# Patient Record
Sex: Female | Born: 1940 | Race: White | Hispanic: No | Smoking: Never smoker
Health system: Southern US, Community
[De-identification: ages and names within clinical notes are randomized; demographics above are authoritative.]

## PROBLEM LIST (undated history)

## (undated) DIAGNOSIS — K279 Peptic ulcer, site unspecified, unspecified as acute or chronic, without hemorrhage or perforation: Secondary | ICD-10-CM

## (undated) DIAGNOSIS — Z9289 Personal history of other medical treatment: Secondary | ICD-10-CM

## (undated) DIAGNOSIS — F329 Major depressive disorder, single episode, unspecified: Secondary | ICD-10-CM

## (undated) DIAGNOSIS — I10 Essential (primary) hypertension: Secondary | ICD-10-CM

## (undated) DIAGNOSIS — J189 Pneumonia, unspecified organism: Secondary | ICD-10-CM

## (undated) DIAGNOSIS — Z87442 Personal history of urinary calculi: Secondary | ICD-10-CM

## (undated) DIAGNOSIS — R51 Headache: Secondary | ICD-10-CM

## (undated) DIAGNOSIS — D649 Anemia, unspecified: Secondary | ICD-10-CM

## (undated) DIAGNOSIS — H9193 Unspecified hearing loss, bilateral: Secondary | ICD-10-CM

## (undated) DIAGNOSIS — M329 Systemic lupus erythematosus, unspecified: Secondary | ICD-10-CM

## (undated) DIAGNOSIS — Z8639 Personal history of other endocrine, nutritional and metabolic disease: Secondary | ICD-10-CM

## (undated) DIAGNOSIS — R0981 Nasal congestion: Secondary | ICD-10-CM

## (undated) DIAGNOSIS — M1990 Unspecified osteoarthritis, unspecified site: Secondary | ICD-10-CM

## (undated) DIAGNOSIS — F1020 Alcohol dependence, uncomplicated: Secondary | ICD-10-CM

## (undated) DIAGNOSIS — E538 Deficiency of other specified B group vitamins: Secondary | ICD-10-CM

## (undated) DIAGNOSIS — M8080XA Other osteoporosis with current pathological fracture, unspecified site, initial encounter for fracture: Secondary | ICD-10-CM

## (undated) DIAGNOSIS — R0600 Dyspnea, unspecified: Secondary | ICD-10-CM

## (undated) DIAGNOSIS — F32A Depression, unspecified: Secondary | ICD-10-CM

## (undated) DIAGNOSIS — M84376A Stress fracture, unspecified foot, initial encounter for fracture: Secondary | ICD-10-CM

## (undated) DIAGNOSIS — J45909 Unspecified asthma, uncomplicated: Secondary | ICD-10-CM

## (undated) DIAGNOSIS — Z8719 Personal history of other diseases of the digestive system: Secondary | ICD-10-CM

## (undated) DIAGNOSIS — K219 Gastro-esophageal reflux disease without esophagitis: Secondary | ICD-10-CM

## (undated) HISTORY — PX: BACK SURGERY: SHX140

---
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
## 1961-10-27 HISTORY — PX: PARTIAL GASTRECTOMY: SHX2172

## 1993-10-27 DIAGNOSIS — M329 Systemic lupus erythematosus, unspecified: Secondary | ICD-10-CM

## 1993-10-27 HISTORY — DX: Systemic lupus erythematosus, unspecified: M32.9

## 2004-08-13 ENCOUNTER — Ambulatory Visit: Payer: Self-pay | Admitting: Neurological Surgery

## 2004-08-27 ENCOUNTER — Ambulatory Visit: Payer: Self-pay | Admitting: Pain Medicine

## 2004-09-10 ENCOUNTER — Ambulatory Visit: Payer: Self-pay | Admitting: Pain Medicine

## 2004-09-26 ENCOUNTER — Ambulatory Visit: Payer: Self-pay | Admitting: Pain Medicine

## 2004-10-09 ENCOUNTER — Ambulatory Visit: Payer: Self-pay | Admitting: Physician Assistant

## 2004-10-22 ENCOUNTER — Ambulatory Visit: Payer: Self-pay | Admitting: Physician Assistant

## 2004-11-05 ENCOUNTER — Ambulatory Visit: Payer: Self-pay | Admitting: Physician Assistant

## 2004-11-18 ENCOUNTER — Ambulatory Visit: Payer: Self-pay | Admitting: Physician Assistant

## 2005-05-01 ENCOUNTER — Ambulatory Visit: Payer: Self-pay | Admitting: Family Medicine

## 2005-05-13 ENCOUNTER — Ambulatory Visit: Payer: Self-pay | Admitting: Family Medicine

## 2005-10-31 ENCOUNTER — Ambulatory Visit: Payer: Self-pay | Admitting: Physician Assistant

## 2006-05-07 ENCOUNTER — Ambulatory Visit: Payer: Self-pay | Admitting: Family Medicine

## 2006-05-18 ENCOUNTER — Ambulatory Visit: Payer: Self-pay | Admitting: Family Medicine

## 2006-11-16 ENCOUNTER — Ambulatory Visit: Payer: Self-pay | Admitting: Physician Assistant

## 2007-05-26 ENCOUNTER — Ambulatory Visit: Payer: Self-pay | Admitting: Family Medicine

## 2007-07-13 ENCOUNTER — Ambulatory Visit: Payer: Self-pay | Admitting: Gastroenterology

## 2007-10-06 ENCOUNTER — Ambulatory Visit: Payer: Self-pay | Admitting: Maternal & Fetal Medicine

## 2007-10-28 HISTORY — PX: ANTERIOR CERVICAL DECOMP/DISCECTOMY FUSION: SHX1161

## 2007-11-08 ENCOUNTER — Ambulatory Visit: Payer: Self-pay | Admitting: Physician Assistant

## 2007-11-17 ENCOUNTER — Ambulatory Visit: Payer: Self-pay | Admitting: Pain Medicine

## 2008-01-06 ENCOUNTER — Ambulatory Visit (HOSPITAL_COMMUNITY): Admission: RE | Admit: 2008-01-06 | Discharge: 2008-01-07 | Payer: Self-pay | Admitting: Neurological Surgery

## 2008-05-30 ENCOUNTER — Ambulatory Visit: Payer: Self-pay | Admitting: Family Medicine

## 2009-06-19 ENCOUNTER — Ambulatory Visit: Payer: Self-pay | Admitting: Family Medicine

## 2009-06-26 ENCOUNTER — Ambulatory Visit: Payer: Self-pay | Admitting: Family Medicine

## 2010-07-10 ENCOUNTER — Ambulatory Visit: Payer: Self-pay | Admitting: Family Medicine

## 2010-10-27 HISTORY — PX: ORIF FEMUR FRACTURE: SHX2119

## 2011-03-11 NOTE — Op Note (Signed)
NAMEADALIE, MAND NO.:  [id number]   MEDICAL RECORD NO.:  [id number]          PATIENT TYPE:  INP   LOCATION:  3172                         FACILITY:  [facility]   PHYSICIAN:  Stefani Dama, M.D.  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   PROCEDURE:  Anterior cervical decompression C3-C4 and C4-C5, arthrodesis  with structural allograft, Alphatec plate fixation.   SURGEON:  Stefani Dama, M.D.   FIRST ASSISTANT:  Cristi Loron, M.D.   ANESTHESIA:  General endotracheal anesthesia.   INDICATIONS:  Ms. Hietala is a 70 year old individual who has noted that  she has had significant neck pain with left shoulder and arm weakness.  She was found to have severe cord compression at the level of C3-C4  along with foraminal stenosis on the left side at C4-C5 with advanced  spondylitic changes at both these levels.  She also has spondylitic  changes at C5-C6, but it appeared that this level had arthrodesed itself  already. She has been advised regarding surgical decompression of C3-C4  and C4-C5.   DESCRIPTION OF PROCEDURE:  The patient was brought to the operating room  supine on the stretcher. After the smooth induction of general  endotracheal anesthesia, she was placed in 5 pounds of halter traction.  The neck was draped and prepped with alcohol and DuraPrep in a sterile  fashion.  A transverse incision was made in the upper cervical portion  of the neck and dissection was carried down to the cervical spine  through the platysma.  The plane between the sternocleidomastoid and  strap muscles were dissected bluntly.  The first identifiable disc space  was  noted to be that of C3-C4 on localizing radiograph.  Dissection was  then carried out between C3-C4 and C4-C5 and a self-retaining retractor  was placed deep into the wound.  The prevertebral tissues were then  dissected on either side of midline and a self-retaining retractor was  placed into the wound.  The C3-C4 disc space was then identified and  opened with a #15 blade and a combination of curettes and rongeurs was  used to evacuate a substantial quantity of severely desiccated disc  material.  The region of the posterior longitudinal ligament was  reached.  There was noted to be a substantial osteophyte from the  inferior margin of the body of C3 and this was drilled down with a 2.3  mm dissecting tool.  Dissection was carried out to the right side and  also to the left side to decompress the common dural tube and the nerve  roots on either side.  A large uncinate spur was noted particularly on  the left side and this carefully dissected away.  Once the dissection  was complete here, some Gelfoam was packed into the lateral gutters for  hemostasis and attention  was then turned to C4-C5 where a similar  procedure was carried out.  Here, the disc space was noted be severely  desiccated, also, and the disc space itself did not want to distract  significantly. Once the disc space was opened to a depth of  approximately 8 mm, a self-retaining spreader could be placed and the  disc space could slowly be opened.  The lateral recess, particularly on  the left side, was decompressed of a large osteophytic overgrowth into  the region of the foramen.  This allowed for good decompression at the  takeoff of the C5 nerve root.  Once this was completed, the area was  irrigated copiously with antibiotic irrigating solution.  A 7 mm trans-  graft was then shaved into the appropriate size and shape and placed  into the interspace after it was filled with VITOSS which was soaked in  the patient's  blood. Attention was then turned to C3-C4 where a similar  size graft was shaped the appropriate size and shape and placed into the  interspace and also countersunk approximately 1-2 mm.  Then, a 30 mm  standard size Alphatec plate was placed on the prevertebral space and  secured with six locking variable angle 4 x 14 mm screws.  Final  radiograph was obtained identifying good position of the surgical  construct.  The wound was then checked for hemostasis.  The remainder of  the VITOSS was packed into the lateral gutters, particularly at C4-C5 on  the left hand side, and once this was accomplished, hemostasis was  doubly checked and the platysma was closed with 3-0 Vicryl and a 4-0  Monocryl subcuticular was used to close the skin.  The patient tolerated  the procedure well and was returned to the recovery room in stable  condition.      Stefani Dama, M.D.  Electronically Signed     HJE/MEDQ  D:  01/06/2008  T:  01/07/2008  Job:  [id number]

## 2011-04-18 ENCOUNTER — Ambulatory Visit: Payer: Self-pay | Admitting: Orthopedic Surgery

## 2011-04-25 ENCOUNTER — Inpatient Hospital Stay: Payer: Self-pay | Admitting: Internal Medicine

## 2011-07-21 LAB — BASIC METABOLIC PANEL
CO2: 29
Calcium: 9.5
Chloride: 105
Glucose, Bld: 94
Potassium: 4.6
Sodium: 140

## 2011-07-21 LAB — HEPATIC FUNCTION PANEL
Alkaline Phosphatase: 64
Bilirubin, Direct: 0.1
Indirect Bilirubin: 0.7
Total Bilirubin: 0.8

## 2011-07-21 LAB — CBC
HCT: 44.1
Hemoglobin: 14.6
MCHC: 33.2
MCV: 95.4
RBC: 4.62
RDW: 14.1

## 2011-08-14 ENCOUNTER — Ambulatory Visit: Payer: Self-pay | Admitting: Family Medicine

## 2011-10-30 ENCOUNTER — Ambulatory Visit: Payer: Self-pay

## 2012-03-01 ENCOUNTER — Ambulatory Visit: Payer: Self-pay | Admitting: Orthopedic Surgery

## 2012-07-15 ENCOUNTER — Emergency Department: Payer: Self-pay | Admitting: Emergency Medicine

## 2012-08-16 ENCOUNTER — Ambulatory Visit: Payer: Self-pay | Admitting: Family Medicine

## 2012-11-11 ENCOUNTER — Other Ambulatory Visit: Payer: Self-pay | Admitting: Neurological Surgery

## 2012-11-18 ENCOUNTER — Encounter (HOSPITAL_COMMUNITY): Payer: Self-pay | Admitting: Pharmacy Technician

## 2012-11-24 ENCOUNTER — Encounter (HOSPITAL_COMMUNITY)
Admission: RE | Admit: 2012-11-24 | Discharge: 2012-11-24 | Disposition: A | Discharge status: Home / Self Care | Payer: Medicare Other | Source: Ambulatory Visit | Attending: Neurological Surgery | Admitting: Neurological Surgery

## 2012-11-24 ENCOUNTER — Ambulatory Visit (HOSPITAL_COMMUNITY)
Admission: RE | Admit: 2012-11-24 | Discharge: 2012-11-24 | Disposition: A | Discharge status: Home / Self Care | Payer: Medicare Other | Source: Ambulatory Visit | Attending: Anesthesiology | Admitting: Anesthesiology

## 2012-11-24 ENCOUNTER — Encounter (HOSPITAL_COMMUNITY): Payer: Self-pay

## 2012-11-24 HISTORY — DX: Depression, unspecified: F32.A

## 2012-11-24 HISTORY — DX: Other osteoporosis with current pathological fracture, unspecified site, initial encounter for fracture: M80.80XA

## 2012-11-24 HISTORY — DX: Systemic lupus erythematosus, unspecified: M32.9

## 2012-11-24 HISTORY — DX: Unspecified asthma, uncomplicated: J45.909

## 2012-11-24 HISTORY — DX: Personal history of other diseases of the digestive system: Z87.19

## 2012-11-24 HISTORY — DX: Stress fracture, unspecified foot, initial encounter for fracture: M84.376A

## 2012-11-24 HISTORY — DX: Essential (primary) hypertension: I10

## 2012-11-24 HISTORY — DX: Unspecified hearing loss, bilateral: H91.93

## 2012-11-24 HISTORY — DX: Headache: R51

## 2012-11-24 HISTORY — DX: Personal history of other medical treatment: Z92.89

## 2012-11-24 HISTORY — DX: Major depressive disorder, single episode, unspecified: F32.9

## 2012-11-24 HISTORY — DX: Unspecified osteoarthritis, unspecified site: M19.90

## 2012-11-24 HISTORY — DX: Deficiency of other specified B group vitamins: E53.8

## 2012-11-24 HISTORY — DX: Nasal congestion: R09.81

## 2012-11-24 HISTORY — DX: Personal history of other endocrine, nutritional and metabolic disease: Z86.39

## 2012-11-24 HISTORY — DX: Anemia, unspecified: D64.9

## 2012-11-24 LAB — COMPREHENSIVE METABOLIC PANEL
Alkaline Phosphatase: 70 U/L (ref 39–117)
BUN: 20 mg/dL (ref 6–23)
Chloride: 103 mEq/L (ref 96–112)
Creatinine, Ser: 0.77 mg/dL (ref 0.50–1.10)
GFR calc Af Amer: >90 mL/min (ref >90)
GFR calc non Af Amer: 81 mL/min — ABNORMAL LOW (ref >90)
Glucose, Bld: 119 mg/dL — ABNORMAL HIGH (ref 70–99)
Potassium: 4.6 mEq/L (ref 3.5–5.1)
Total Bilirubin: 0.2 mg/dL — ABNORMAL LOW (ref 0.3–1.2)

## 2012-11-24 LAB — CBC
HCT: 43.8 % (ref 36.0–46.0)
Hemoglobin: 14 g/dL (ref 12.0–15.0)
MCHC: 32 g/dL (ref 30.0–36.0)
MCV: 93 fL (ref 78.0–100.0)
WBC: 7.1 10*3/uL (ref 4.0–10.5)

## 2012-11-24 LAB — TYPE AND SCREEN
ABO/RH(D): O POS
Antibody Screen: NEGATIVE

## 2012-11-24 NOTE — Pre-Procedure Instructions (Signed)
Renee Cabrera  11/24/2012   Your procedure is scheduled on:  Monday, February 3  Report to [facility] at 1020 AM.  Call this number if you have problems the morning of surgery: [phone]   Remember:   Do not eat food or drink liquids after midnight.Sunday night   Take these medicines the morning of surgery with A SIP OF WATER: Citalopram,Hydrocodone,              Stop aspirin,herbals, any medication that may thin your blood.  Do not wear jewelry, make-up or nail polish.  Do not wear lotions, powders, or perfumes. You may not wear deodorant.  Do not shave 48 hours prior to surgery.   Do not bring valuables to the hospital.  Contacts, dentures or bridgework may not be worn into surgery.  Leave suitcase in the car. After surgery it may be brought to your room.  For patients admitted to the hospital, checkout time is 11:00 AM the day of  discharge.        Special Instructions: Shower using CHG 2 nights before surgery and the night before surgery.  If you shower the day of surgery use CHG.  Use special wash - you have one bottle of CHG for all showers.  You should use approximately 1/3 of the bottle for each shower.   Please read over the following fact sheets that you were given: Pain Booklet, Coughing and Deep Breathing, Blood Transfusion Information, MRSA Information and Surgical Site Infection Prevention

## 2012-11-25 NOTE — Consult Note (Signed)
Anesthesia Chart Review:  Patient is a 72 year old female scheduled for L4-5 decompression/PLIF on 11/29/12 by Dr. Danielle Dess.  History includes non-smoker, HTN, depression, asthma, SLE '95, osteoporosis, hearing loss, anemia, GI bleed, arthritis, prior C3-4, C4-5 ACDF '09 and partial gastrectomy.  Preoperative labs noted.    EKG on 11/24/12 showed NSR, low voltage QRS, non-specific T wave abnormality.  CXR report on 11/24/12 showed (Dr. Carlota Raspberry): 1. 15 mm right upper lobe pulmonary nodule. Follow-up chest CT recommended for further assessment, preferably with infusion. These results will be called to the ordering clinician or representative by the Radiologist Assistant, and communication documented in the PACS Dashboard.  2. Old granulomatous disease.  3. No acute cardiopulmonary disease.  (I also left a message with Shanda Bumps at [facility] regarding results.  Will defer timing of further evaluation to Dr. Danielle Dess.)  If no acute change in her status or new pulmonary symptoms then would anticipate she could proceed as planned.  Shonna Chock, PA-C 11/25/12 1111

## 2012-11-28 MED ORDER — CEFAZOLIN SODIUM-DEXTROSE 2-3 GM-% IV SOLR
2.0000 g | INTRAVENOUS | Status: AC
  Administered 2012-11-29: 2 g via INTRAVENOUS
  Filled 2012-11-28: qty 50

## 2012-11-28 NOTE — H&P (Signed)
Renee Cabrera is an 72 y.o. female.   Chief Complaint: Back and bilateral leg pain HPI: Patient is a 71 y.o.lady whom I have followed since 2006 for difficulties with her neck and back. She has had decompression and fusion of her cervical spine in the past. She had progressive worsening of back and leg symptoms for a number of years related to progressive spondylolithesis. This is at L4-5. Having failed attempts at conservative manangement she is now admitted for surgical decompression and fusion.  Past Medical History  Diagnosis Date  . Stress fracture of foot     left foot  . Osteoporosis with fracture   . Hypertension   . H/O hypoglycemia   . Depression     controlled  . Asthma     remote h/o asthma  . Headache     h/o migraines  . Lupus (systemic lupus erythematosus) 1995    in remission since 2005  . Arthritis     osteoarthritis  . Vitamin B 12 deficiency   . Anemia   . Nasal sinus congestion   . Hearing loss, bilateral     wears hearing aides  . History of blood transfusion     pt states she had yellow jaundice  . H/O: GI bleed     Past Surgical History  Procedure Date  . Anterior cervical decomp/discectomy fusion 2009  . Tonsillectomy 1945  . Partial gastrectomy 1963    and reconstruction 1973  . Orif femur fracture 2012    No family history on file. Social History:  reports that she has never smoked. She has never used smokeless tobacco. She reports that she does not drink alcohol or use illicit drugs.  Allergies:  Allergies  Allergen Reactions  . Sulfa Antibiotics Rash    No prescriptions prior to admission    No results found for this or any previous visit (from the past 48 hour(s)). No results found.  Review of Systems  Constitutional: Negative.   HENT: Negative.   Eyes: Negative.   Respiratory: Negative.   Cardiovascular: Negative.   Gastrointestinal: Negative.   Genitourinary: Negative.   Musculoskeletal: Positive for back pain.  Skin:  Negative.   Neurological: Negative.   Endo/Heme/Allergies: Negative.   Psychiatric/Behavioral: Negative.     There were no vitals taken for this visit. Physical Exam  Constitutional: She is oriented to person, place, and time. She appears well-developed and well-nourished.  HENT:  Head: Normocephalic and atraumatic.  Eyes: Conjunctivae normal and EOM are normal. Pupils are equal, round, and reactive to light.  Neck: Normal range of motion. Neck supple.  Cardiovascular: Normal rate, regular rhythm and normal heart sounds.   Respiratory: Effort normal and breath sounds normal.  GI: Soft. Bowel sounds are normal.  Musculoskeletal: Normal range of motion.  Neurological: She is alert and oriented to person, place, and time. She has normal reflexes.  Skin: Skin is warm and dry.  Psychiatric: She has a normal mood and affect. Her behavior is normal. Judgment and thought content normal.     Assessment/Plan   I had seen her back at the end of  September and discussed with her her spondylolisthesis and high grade stenosis at L4-L5.  I indicated that ultimately I believe that Renee Cabrera would require surgical decompression and stabilization, but as long as her symptoms were tolerable, she could continue to follow this.  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  She notes that the back symptoms have not gotten a whole lot better and they continue to bother her considerably and she would like to proceed with surgical intervention for this process.  I again reviewed her MRI which demonstrates that she has a spondylolisthesis at L4-L5 with a high grade stenosis of the central portion of the canal.  I advised that surgery would require decompression of the central portion of the canal, lateral recesses, which would cause instability, and she would need to have this joint fused.  This would require placement of screws in the vertebrae  of L4 and L5 with two short rods to hold the screws together.  Afterwards, she would be required to use a corset type brace to maintain her posture.  The surgery should be eminently tolerable for her.  She does live alone.  She asked whether she would need help at the house and she has arranged for some help from some neighbor friends that can help her initially.  I am hopeful that she should get by with the surgery and she should ultimately feel better from her back and her lower extremities. She is admitted for surgery.     Adonai Selsor J 11/28/2012, 4:21 AM

## 2012-11-29 ENCOUNTER — Encounter (HOSPITAL_COMMUNITY): Payer: Self-pay

## 2012-11-29 ENCOUNTER — Encounter (HOSPITAL_COMMUNITY): Payer: Self-pay | Admitting: Vascular Surgery

## 2012-11-29 ENCOUNTER — Encounter (HOSPITAL_COMMUNITY)
Admission: RE | Disposition: A | Discharge status: Home / Self Care | Payer: Self-pay | Source: Ambulatory Visit | Attending: Neurological Surgery

## 2012-11-29 ENCOUNTER — Inpatient Hospital Stay (HOSPITAL_COMMUNITY): Payer: Medicare Other | Admitting: Vascular Surgery

## 2012-11-29 ENCOUNTER — Inpatient Hospital Stay (HOSPITAL_COMMUNITY): Payer: Medicare Other

## 2012-11-29 ENCOUNTER — Inpatient Hospital Stay (HOSPITAL_COMMUNITY)
Admission: RE | Admit: 2012-11-29 | Discharge: 2012-12-03 | DRG: 460 | Disposition: A | Discharge status: Home / Self Care | Payer: Medicare Other | Source: Ambulatory Visit | Attending: Neurological Surgery | Admitting: Neurological Surgery

## 2012-11-29 DIAGNOSIS — Q762 Congenital spondylolisthesis: Secondary | ICD-10-CM

## 2012-11-29 DIAGNOSIS — F329 Major depressive disorder, single episode, unspecified: Secondary | ICD-10-CM | POA: Diagnosis present

## 2012-11-29 DIAGNOSIS — Z01812 Encounter for preprocedural laboratory examination: Secondary | ICD-10-CM

## 2012-11-29 DIAGNOSIS — M4120 Other idiopathic scoliosis, site unspecified: Secondary | ICD-10-CM | POA: Diagnosis present

## 2012-11-29 DIAGNOSIS — Z882 Allergy status to sulfonamides status: Secondary | ICD-10-CM

## 2012-11-29 DIAGNOSIS — I10 Essential (primary) hypertension: Secondary | ICD-10-CM | POA: Diagnosis present

## 2012-11-29 DIAGNOSIS — Z7982 Long term (current) use of aspirin: Secondary | ICD-10-CM

## 2012-11-29 DIAGNOSIS — F3289 Other specified depressive episodes: Secondary | ICD-10-CM | POA: Diagnosis present

## 2012-11-29 DIAGNOSIS — M4316 Spondylolisthesis, lumbar region: Secondary | ICD-10-CM | POA: Diagnosis present

## 2012-11-29 DIAGNOSIS — M47817 Spondylosis without myelopathy or radiculopathy, lumbosacral region: Principal | ICD-10-CM | POA: Diagnosis present

## 2012-11-29 DIAGNOSIS — Z79899 Other long term (current) drug therapy: Secondary | ICD-10-CM

## 2012-11-29 DIAGNOSIS — Z981 Arthrodesis status: Secondary | ICD-10-CM

## 2012-11-29 DIAGNOSIS — M329 Systemic lupus erythematosus, unspecified: Secondary | ICD-10-CM | POA: Diagnosis present

## 2012-11-29 DIAGNOSIS — M810 Age-related osteoporosis without current pathological fracture: Secondary | ICD-10-CM | POA: Diagnosis present

## 2012-11-29 SURGERY — POSTERIOR LUMBAR FUSION 1 LEVEL
Anesthesia: General | Site: Spine Lumbar | Wound class: Clean

## 2012-11-29 MED ORDER — FENTANYL CITRATE 0.05 MG/ML IJ SOLN
INTRAMUSCULAR | Status: DC | PRN
  Administered 2012-11-29 (×4): 50 ug via INTRAVENOUS
  Administered 2012-11-29: 100 ug via INTRAVENOUS

## 2012-11-29 MED ORDER — NEOSTIGMINE METHYLSULFATE 1 MG/ML IJ SOLN
INTRAMUSCULAR | Status: DC | PRN
  Administered 2012-11-29: 2 mg via INTRAVENOUS

## 2012-11-29 MED ORDER — METHOCARBAMOL 500 MG PO TABS
500.0000 mg | ORAL_TABLET | Freq: Four times a day (QID) | ORAL | Status: DC | PRN
  Administered 2012-11-30: 500 mg via ORAL
  Filled 2012-11-29: qty 1

## 2012-11-29 MED ORDER — LACTATED RINGERS IV SOLN
INTRAVENOUS | Status: DC | PRN
  Administered 2012-11-29 (×2): via INTRAVENOUS

## 2012-11-29 MED ORDER — ONDANSETRON HCL 4 MG/2ML IJ SOLN
INTRAMUSCULAR | Status: DC | PRN
  Administered 2012-11-29: 4 mg via INTRAVENOUS

## 2012-11-29 MED ORDER — ALUM & MAG HYDROXIDE-SIMETH 200-200-20 MG/5ML PO SUSP: 30.0000 mL | Freq: Four times a day (QID) | ORAL | Status: DC | PRN

## 2012-11-29 MED ORDER — OXYCODONE-ACETAMINOPHEN 5-325 MG PO TABS
1.0000 | ORAL_TABLET | ORAL | Status: DC | PRN
  Administered 2012-11-29: 1 via ORAL
  Administered 2012-11-30 (×3): 2 via ORAL
  Administered 2012-12-01: 1 via ORAL
  Administered 2012-12-01: 2 via ORAL
  Administered 2012-12-02 – 2012-12-03 (×4): 1 via ORAL
  Filled 2012-11-29 (×2): qty 2
  Filled 2012-11-29: qty 1
  Filled 2012-11-29: qty 2
  Filled 2012-11-29 (×4): qty 1
  Filled 2012-11-29 (×2): qty 2

## 2012-11-29 MED ORDER — SODIUM CHLORIDE 0.9 % IV SOLN: 250.0000 mL | INTRAVENOUS | Status: DC

## 2012-11-29 MED ORDER — DOCUSATE SODIUM 100 MG PO CAPS
100.0000 mg | ORAL_CAPSULE | Freq: Two times a day (BID) | ORAL | Status: DC
  Administered 2012-11-29 – 2012-12-03 (×8): 100 mg via ORAL
  Filled 2012-11-29 (×7): qty 1

## 2012-11-29 MED ORDER — LIDOCAINE HCL (CARDIAC) 20 MG/ML IV SOLN
INTRAVENOUS | Status: DC | PRN
  Administered 2012-11-29: 100 mg via INTRAVENOUS

## 2012-11-29 MED ORDER — HEMOSTATIC AGENTS (NO CHARGE) OPTIME
TOPICAL | Status: DC | PRN
  Administered 2012-11-29: 1 via TOPICAL

## 2012-11-29 MED ORDER — ACETAMINOPHEN 650 MG RE SUPP: 650.0000 mg | RECTAL | Status: DC | PRN

## 2012-11-29 MED ORDER — CITALOPRAM HYDROBROMIDE 20 MG PO TABS
20.0000 mg | ORAL_TABLET | Freq: Every day | ORAL | Status: DC
  Administered 2012-11-29 – 2012-12-03 (×5): 20 mg via ORAL
  Filled 2012-11-29 (×5): qty 1

## 2012-11-29 MED ORDER — CEFAZOLIN SODIUM-DEXTROSE 2-3 GM-% IV SOLR: 2.0000 g | INTRAVENOUS | Status: DC

## 2012-11-29 MED ORDER — ACETAMINOPHEN 325 MG PO TABS: 650.0000 mg | ORAL_TABLET | ORAL | Status: DC | PRN

## 2012-11-29 MED ORDER — SODIUM CHLORIDE 0.9 % IJ SOLN
3.0000 mL | Freq: Two times a day (BID) | INTRAMUSCULAR | Status: DC
  Administered 2012-11-30 – 2012-12-03 (×5): 3 mL via INTRAVENOUS

## 2012-11-29 MED ORDER — EPHEDRINE SULFATE 50 MG/ML IJ SOLN
INTRAMUSCULAR | Status: DC | PRN
  Administered 2012-11-29: 10 mg via INTRAVENOUS
  Administered 2012-11-29: 5 mg via INTRAVENOUS

## 2012-11-29 MED ORDER — MENTHOL 3 MG MT LOZG: 1.0000 | LOZENGE | OROMUCOSAL | Status: DC | PRN

## 2012-11-29 MED ORDER — BACITRACIN 50000 UNITS IM SOLR
INTRAMUSCULAR | Status: AC
  Filled 2012-11-29: qty 1

## 2012-11-29 MED ORDER — LIDOCAINE-EPINEPHRINE 1 %-1:100000 IJ SOLN
INTRAMUSCULAR | Status: DC | PRN
  Administered 2012-11-29: 9 mL

## 2012-11-29 MED ORDER — DEXTROSE 5 % IV SOLN
500.0000 mg | Freq: Four times a day (QID) | INTRAVENOUS | Status: DC | PRN
  Filled 2012-11-29: qty 5

## 2012-11-29 MED ORDER — PROPOFOL 10 MG/ML IV BOLUS
INTRAVENOUS | Status: DC | PRN
  Administered 2012-11-29: 120 mg via INTRAVENOUS

## 2012-11-29 MED ORDER — GLYCOPYRROLATE 0.2 MG/ML IJ SOLN
INTRAMUSCULAR | Status: DC | PRN
  Administered 2012-11-29: 0.4 mg via INTRAVENOUS

## 2012-11-29 MED ORDER — ROCURONIUM BROMIDE 100 MG/10ML IV SOLN
INTRAVENOUS | Status: DC | PRN
  Administered 2012-11-29 (×3): 10 mg via INTRAVENOUS
  Administered 2012-11-29: 40 mg via INTRAVENOUS

## 2012-11-29 MED ORDER — BUPIVACAINE HCL (PF) 0.5 % IJ SOLN
INTRAMUSCULAR | Status: DC | PRN
  Administered 2012-11-29: 9 mL

## 2012-11-29 MED ORDER — SODIUM CHLORIDE 0.9 % IR SOLN
Status: DC | PRN
  Administered 2012-11-29: 13:00:00

## 2012-11-29 MED ORDER — LISINOPRIL 2.5 MG PO TABS
2.5000 mg | ORAL_TABLET | Freq: Every day | ORAL | Status: DC
  Administered 2012-11-29 – 2012-12-03 (×4): 2.5 mg via ORAL
  Filled 2012-11-29 (×5): qty 1

## 2012-11-29 MED ORDER — MORPHINE SULFATE 2 MG/ML IJ SOLN
1.0000 mg | INTRAMUSCULAR | Status: DC | PRN
  Administered 2012-11-29 (×2): 2 mg via INTRAVENOUS
  Filled 2012-11-29 (×2): qty 1

## 2012-11-29 MED ORDER — MIRTAZAPINE 30 MG PO TABS
30.0000 mg | ORAL_TABLET | Freq: Every day | ORAL | Status: DC
  Administered 2012-11-29 – 2012-12-02 (×4): 30 mg via ORAL
  Filled 2012-11-29 (×5): qty 1

## 2012-11-29 MED ORDER — ONDANSETRON HCL 4 MG/2ML IJ SOLN: 4.0000 mg | INTRAMUSCULAR | Status: DC | PRN

## 2012-11-29 MED ORDER — BISACODYL 10 MG RE SUPP: 10.0000 mg | Freq: Every day | RECTAL | Status: DC | PRN

## 2012-11-29 MED ORDER — ARTIFICIAL TEARS OP OINT
TOPICAL_OINTMENT | OPHTHALMIC | Status: DC | PRN
  Administered 2012-11-29: 1 via OPHTHALMIC

## 2012-11-29 MED ORDER — SENNA 8.6 MG PO TABS
1.0000 | ORAL_TABLET | Freq: Two times a day (BID) | ORAL | Status: DC
  Administered 2012-11-29 – 2012-12-03 (×8): 8.6 mg via ORAL
  Filled 2012-11-29 (×9): qty 1

## 2012-11-29 MED ORDER — HYDROMORPHONE HCL PF 1 MG/ML IJ SOLN
INTRAMUSCULAR | Status: AC
  Filled 2012-11-29: qty 1

## 2012-11-29 MED ORDER — THROMBIN 20000 UNITS EX SOLR
CUTANEOUS | Status: DC | PRN
  Administered 2012-11-29: 13:00:00 via TOPICAL

## 2012-11-29 MED ORDER — SODIUM CHLORIDE 0.9 % IJ SOLN: 3.0000 mL | INTRAMUSCULAR | Status: DC | PRN

## 2012-11-29 MED ORDER — ONDANSETRON HCL 4 MG/2ML IJ SOLN: 4.0000 mg | Freq: Once | INTRAMUSCULAR | Status: DC | PRN

## 2012-11-29 MED ORDER — SODIUM CHLORIDE 0.9 % IV SOLN
INTRAVENOUS | Status: AC
  Filled 2012-11-29: qty 500

## 2012-11-29 MED ORDER — HYDROMORPHONE HCL PF 1 MG/ML IJ SOLN
0.2500 mg | INTRAMUSCULAR | Status: DC | PRN
  Administered 2012-11-29 (×2): 0.5 mg via INTRAVENOUS

## 2012-11-29 MED ORDER — CEFAZOLIN SODIUM 1-5 GM-% IV SOLN
1.0000 g | Freq: Three times a day (TID) | INTRAVENOUS | Status: AC
  Administered 2012-11-29 – 2012-11-30 (×2): 1 g via INTRAVENOUS
  Filled 2012-11-29 (×3): qty 50

## 2012-11-29 MED ORDER — 0.9 % SODIUM CHLORIDE (POUR BTL) OPTIME
TOPICAL | Status: DC | PRN
  Administered 2012-11-29: 1000 mL

## 2012-11-29 MED ORDER — POLYETHYLENE GLYCOL 3350 17 G PO PACK
17.0000 g | PACK | Freq: Every day | ORAL | Status: DC | PRN
  Filled 2012-11-29: qty 1

## 2012-11-29 MED ORDER — PHENOL 1.4 % MT LIQD: 1.0000 | OROMUCOSAL | Status: DC | PRN

## 2012-11-29 MED ORDER — LIDOCAINE HCL 4 % MT SOLN
OROMUCOSAL | Status: DC | PRN
  Administered 2012-11-29: 4 mL via TOPICAL

## 2012-11-29 SURGICAL SUPPLY — 69 items
BAG DECANTER FOR FLEXI CONT (MISCELLANEOUS) ×2 IMPLANT
BLADE SURG ROTATE 9660 (MISCELLANEOUS) IMPLANT
BUR MATCHSTICK NEURO 3.0 LAGG (BURR) ×2 IMPLANT
CANISTER SUCTION 2500CC (MISCELLANEOUS) ×2 IMPLANT
CATH FOLEY 2WAY SLVR  5CC 12FR (CATHETERS) ×1
CATH FOLEY 2WAY SLVR 5CC 12FR (CATHETERS) ×1 IMPLANT
CLOTH BEACON ORANGE TIMEOUT ST (SAFETY) ×2 IMPLANT
CONT SPEC 4OZ CLIKSEAL STRL BL (MISCELLANEOUS) ×4 IMPLANT
COVER BACK TABLE 24X17X13 BIG (DRAPES) IMPLANT
COVER TABLE BACK 60X90 (DRAPES) ×2 IMPLANT
DECANTER SPIKE VIAL GLASS SM (MISCELLANEOUS) ×2 IMPLANT
DERMABOND ADHESIVE PROPEN (GAUZE/BANDAGES/DRESSINGS) ×1
DERMABOND ADVANCED (GAUZE/BANDAGES/DRESSINGS)
DERMABOND ADVANCED .7 DNX12 (GAUZE/BANDAGES/DRESSINGS) IMPLANT
DERMABOND ADVANCED .7 DNX6 (GAUZE/BANDAGES/DRESSINGS) ×1 IMPLANT
DRAPE C-ARM 42X72 X-RAY (DRAPES) ×4 IMPLANT
DRAPE LAPAROTOMY 100X72X124 (DRAPES) ×2 IMPLANT
DRAPE POUCH INSTRU U-SHP 10X18 (DRAPES) ×2 IMPLANT
DRAPE PROXIMA HALF (DRAPES) IMPLANT
DURAPREP 26ML APPLICATOR (WOUND CARE) ×2 IMPLANT
ELECT REM PT RETURN 9FT ADLT (ELECTROSURGICAL) ×2
ELECTRODE REM PT RTRN 9FT ADLT (ELECTROSURGICAL) ×1 IMPLANT
GAUZE SPONGE 4X4 16PLY XRAY LF (GAUZE/BANDAGES/DRESSINGS) IMPLANT
GLOVE BIO SURGEON STRL SZ 6.5 (GLOVE) ×2 IMPLANT
GLOVE BIO SURGEON STRL SZ8 (GLOVE) ×2 IMPLANT
GLOVE BIOGEL PI IND STRL 6.5 (GLOVE) ×1 IMPLANT
GLOVE BIOGEL PI IND STRL 8 (GLOVE) ×2 IMPLANT
GLOVE BIOGEL PI IND STRL 8.5 (GLOVE) ×3 IMPLANT
GLOVE BIOGEL PI INDICATOR 6.5 (GLOVE) ×1
GLOVE BIOGEL PI INDICATOR 8 (GLOVE) ×2
GLOVE BIOGEL PI INDICATOR 8.5 (GLOVE) ×3
GLOVE ECLIPSE 6.5 STRL STRAW (GLOVE) ×4 IMPLANT
GLOVE ECLIPSE 8.5 STRL (GLOVE) ×6 IMPLANT
GLOVE EXAM NITRILE LRG STRL (GLOVE) IMPLANT
GLOVE EXAM NITRILE MD LF STRL (GLOVE) IMPLANT
GLOVE EXAM NITRILE XL STR (GLOVE) IMPLANT
GLOVE EXAM NITRILE XS STR PU (GLOVE) IMPLANT
GLOVE INDICATOR 8.5 STRL (GLOVE) ×2 IMPLANT
GLOVE SURG SS PI 8.0 STRL IVOR (GLOVE) ×4 IMPLANT
GOWN BRE IMP SLV AUR LG STRL (GOWN DISPOSABLE) ×2 IMPLANT
GOWN BRE IMP SLV AUR XL STRL (GOWN DISPOSABLE) ×2 IMPLANT
GOWN STRL REIN 2XL LVL4 (GOWN DISPOSABLE) ×6 IMPLANT
KIT BASIN OR (CUSTOM PROCEDURE TRAY) ×2 IMPLANT
KIT ROOM TURNOVER OR (KITS) ×2 IMPLANT
MILL MEDIUM DISP (BLADE) ×2 IMPLANT
NEEDLE HYPO 22GX1.5 SAFETY (NEEDLE) ×2 IMPLANT
NS IRRIG 1000ML POUR BTL (IV SOLUTION) ×2 IMPLANT
PACK FOAM VITOSS 10CC (Orthopedic Implant) ×2 IMPLANT
PACK LAMINECTOMY NEURO (CUSTOM PROCEDURE TRAY) ×2 IMPLANT
PAD ARMBOARD 7.5X6 YLW CONV (MISCELLANEOUS) ×10 IMPLANT
PATTIES SURGICAL .5 X1 (DISPOSABLE) ×2 IMPLANT
PEEK PLIF NOVEL 9X25X12 (Peek) ×4 IMPLANT
ROD 30MM (Rod) ×4 IMPLANT
SCREW 35MM PEDICLE (Screw) ×8 IMPLANT
SCREW SET SPINAL STD HEXALOBE (Screw) ×8 IMPLANT
SPONGE GAUZE 4X4 12PLY (GAUZE/BANDAGES/DRESSINGS) ×2 IMPLANT
SPONGE LAP 4X18 X RAY DECT (DISPOSABLE) IMPLANT
SPONGE SURGIFOAM ABS GEL 100 (HEMOSTASIS) ×2 IMPLANT
SUT VIC AB 1 CT1 18XBRD ANBCTR (SUTURE) ×1 IMPLANT
SUT VIC AB 1 CT1 8-18 (SUTURE) ×1
SUT VIC AB 2-0 CP2 18 (SUTURE) ×2 IMPLANT
SUT VIC AB 3-0 SH 8-18 (SUTURE) ×2 IMPLANT
SYR 20ML ECCENTRIC (SYRINGE) ×2 IMPLANT
TAPE CLOTH SURG 4X10 WHT LF (GAUZE/BANDAGES/DRESSINGS) ×2 IMPLANT
TOWEL OR 17X24 6PK STRL BLUE (TOWEL DISPOSABLE) ×2 IMPLANT
TOWEL OR 17X26 10 PK STRL BLUE (TOWEL DISPOSABLE) ×2 IMPLANT
TRAP SPECIMEN MUCOUS 40CC (MISCELLANEOUS) ×2 IMPLANT
TRAY FOLEY CATH 14FRSI W/METER (CATHETERS) ×2 IMPLANT
WATER STERILE IRR 1000ML POUR (IV SOLUTION) ×2 IMPLANT

## 2012-11-29 NOTE — Anesthesia Postprocedure Evaluation (Signed)
  Anesthesia Post-op Note  Patient: Renee Cabrera  Procedure(s) Performed: Procedure(s) (LRB) with comments: POSTERIOR LUMBAR FUSION 1 LEVEL (N/A) - Lumbar four-five Decompression/posterior lumbar interbody fusion/pedicle screws  Patient Location: PACU  Anesthesia Type:General  Level of Consciousness: awake, alert  and oriented  Airway and Oxygen Therapy: Patient Spontanous Breathing and Patient connected to nasal cannula oxygen  Post-op Pain: mild  Post-op Assessment: Post-op Vital signs reviewed  Post-op Vital Signs: Reviewed  Complications: No apparent anesthesia complications

## 2012-11-29 NOTE — Anesthesia Preprocedure Evaluation (Signed)
Anesthesia Evaluation  Patient identified by MRN, date of birth, ID band Patient awake    Reviewed: Allergy & Precautions, H&P , NPO status , Patient's Chart, lab work & pertinent test results  Airway Mallampati: I TM Distance: >3 FB Neck ROM: full    Dental   Pulmonary asthma ,          Cardiovascular hypertension, Rhythm:regular Rate:Normal     Neuro/Psych  Headaches, PSYCHIATRIC DISORDERS Depression    GI/Hepatic   Endo/Other    Renal/GU      Musculoskeletal   Abdominal   Peds  Hematology   Anesthesia Other Findings Lupus  Reproductive/Obstetrics                           Anesthesia Physical Anesthesia Plan  ASA: I  Anesthesia Plan: General   Post-op Pain Management:    Induction: Intravenous  Airway Management Planned: Oral ETT  Additional Equipment:   Intra-op Plan:   Post-operative Plan: Extubation in OR  Informed Consent: I have reviewed the patients History and Physical, chart, labs and discussed the procedure including the risks, benefits and alternatives for the proposed anesthesia with the patient or authorized representative who has indicated his/her understanding and acceptance.     Plan Discussed with: CRNA, Anesthesiologist and Surgeon  Anesthesia Plan Comments:         Anesthesia Quick Evaluation

## 2012-11-29 NOTE — Preoperative (Signed)
Beta Blockers   Reason not to administer Beta Blockers:Not Applicable 

## 2012-11-29 NOTE — Op Note (Addendum)
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  Asst.: Daryl Eastern  Indications: Patient is Renee Cabrera is a 72 y.o. female who who's had significant back pain and lumbar radiculopathy for over a years period time. A lumbar myelogram demonstrates advanced spondylolisthesis with high-grade canal stenosis. she was advised regarding surgical intervention.  Procedure: The patient was brought to the operating room supine on a stretcher. After the smooth induction of general endotracheal anesthesia she was turned prone and the back was prepped with alcohol and DuraPrep. The back was then draped sterilely. A midline incision was created and carried down to the lumbar dorsal fascia. A localizing radiograph identified the L4 and L5 spinous processes. A subligamentous dissection was created at L4 and L5 to expose the interlaminar space at L4 and L5 and the facet joints over the L4-L5 interspace. Laminotomies were were then created removing the entire inferior margin of the lamina of L4 including the inferior facet at the L4-L5 joint. The yellow ligament was taken up and the common dural tube was exposed along with the L4 nerve root superiorly, and the L5 nerve root inferiorly. These nerve roots were decompressed from significant stenosis area of stenosis was worse on the day right side that was on the left. Any worsening or nerve root was L4 on the right side. the disc space was exposed and epidural veins in this region were cauterized and divided. The L4 nerve roots and the L5 nerve root were  dissected with care taken to protect them. The disc space was opened and a combination of curettes and rongeurs was used to evacuate the disc space fully. The endplates were removed using sharp curettes. An interbody spacer was placed to distract the disc space while the contralateral discectomy was performed. When the entirety of the disc was removed and the endplates were prepared final sizing of the disc space was obtained 12 mm peek spacers were chosen and packed with autograft and allograft and placed into the interspace. The remainder of the interspace was packed with autograft and allograft. Pedicle entry sites were then chosen using fluoroscopic guidance and 5.5 x 35 mm screws were placed in L4 and 5.5 x 35 mm screws were placed in L5. The lateral gutters were decorticated and graft was packed in the posterolateral gutters between L4 and L5. Final radiographs were obtained after placing appropriately sized rods between the pedicle screws at L4-L5 and torquing these to the appropriate tension. The surgical site was inspected carefully to assure the L4 and L5 nerve roots were well decompressed, hemostasis was obtained, and the graft was well packed. Then the retractors were removed and the wound was closed with #1 Vicryl in the lumbar dorsal fascia 2-0 Vicryl in the subcutaneous tissue and 3-0 Vicryl subcuticularly. When he cc of half percent Marcaine was injected into the paraspinous musculature at the time of closure. Blood loss was estimated at 150 cc. The patient tolerated procedure well and was returned to the recovery room in stable condition.

## 2012-11-29 NOTE — Progress Notes (Signed)
Patient ID: Renee Cabrera, female   DOB: December 08, 1940, 72 y.o.   MRN: [id number] Postop check. Patient is awake alert moving both lower extremities quite well good strength in tibialis anterior quadriceps gastrocs bilaterally. Dressing is clean and dry. Patient is reasonably comfortable. Stable postop

## 2012-11-29 NOTE — Transfer of Care (Signed)
Immediate Anesthesia Transfer of Care Note  Patient: Renee Cabrera  Procedure(s) Performed: Procedure(s) (LRB) with comments: POSTERIOR LUMBAR FUSION 1 LEVEL (N/A) - Lumbar four-five Decompression/posterior lumbar interbody fusion/pedicle screws  Patient Location: PACU  Anesthesia Type:General  Level of Consciousness: awake, alert  and oriented  Airway & Oxygen Therapy: Patient Spontanous Breathing and Patient connected to nasal cannula oxygen  Post-op Assessment: Report given to PACU RN and Patient moving all extremities X 4  Post vital signs: Reviewed and stable  Complications: No apparent anesthesia complications

## 2012-11-29 NOTE — Anesthesia Procedure Notes (Signed)
Procedure Name: Intubation Date/Time: 11/29/2012 1:44 PM Performed by: Marena Chancy Pre-anesthesia Checklist: Patient identified, Timeout performed, Emergency Drugs available, Suction available and Patient being monitored Patient Re-evaluated:Patient Re-evaluated prior to inductionOxygen Delivery Method: Circle system utilized Preoxygenation: Pre-oxygenation with 100% oxygen Intubation Type: IV induction Ventilation: Mask ventilation without difficulty Laryngoscope Size: Mac and 3 Grade View: Grade III Tube type: Oral Tube size: 7.0 mm Number of attempts: 2 Airway Equipment and Method: Bougie stylet and LTA kit utilized Placement Confirmation: positive ETCO2 and breath sounds checked- equal and bilateral Secured at: 20 cm Tube secured with: Tape Dental Injury: Teeth and Oropharynx as per pre-operative assessment  Difficulty Due To: Difficult Airway- due to limited oral opening

## 2012-11-30 ENCOUNTER — Encounter (HOSPITAL_COMMUNITY): Payer: Self-pay | Admitting: *Deleted

## 2012-11-30 MED FILL — Sodium Chloride IV Soln 0.9%: INTRAVENOUS | Qty: 1000 | Status: AC

## 2012-11-30 MED FILL — Heparin Sodium (Porcine) Inj 1000 Unit/ML: INTRAMUSCULAR | Qty: 30 | Status: AC

## 2012-11-30 MED FILL — Sodium Chloride Irrigation Soln 0.9%: Qty: 3000 | Status: AC

## 2012-11-30 NOTE — Clinical Social Work Note (Signed)
Clinical Social Work   CSW reviewed chart and discussed pt with PT. PT is recommending no PT follow up and DME. CSW will update RNCM. CSW is signing off as no further needs identified. Please reconsult if a need arises prior to discharge.   Dede Query, MSW, [facility] [phone]

## 2012-11-30 NOTE — Clinical Social Work Note (Signed)
Clinical Social Work   CSW received a consult for SNF. CSW reviewed chart and discussed pt with RN during progression. Awaiting PT/OT evals for discharge recommendations. CSW will assess for SNF, if appropriate. Please call with any urgent needs. CSW will continue to follow.   Dede Query, MSW, [facility] [phone]

## 2012-11-30 NOTE — Progress Notes (Signed)
1630 Foley catheter discontinued per order.

## 2012-11-30 NOTE — Progress Notes (Signed)
UR COMPLETED  

## 2012-11-30 NOTE — Plan of Care (Signed)
Problem: Consults Goal: Diagnosis - Spinal Surgery Thoraco/Lumbar Spine Fusion     

## 2012-11-30 NOTE — Evaluation (Signed)
Occupational Therapy Evaluation Patient Details Name: Renee Cabrera MRN: [id number] DOB: 1941/03/25 Today's Date: 11/30/2012 Time: 2952-8413 OT Time Calculation (min): 30 min  OT Assessment / Plan / Recommendation Clinical Impression  Pt is recovering from [place]/Lumbar Spine Fusion.  She moving well POD1.  Will follow to instruct in ADL related to back precautions.  Pt has planned to hire assist for when she returns home.        OT Assessment  Patient needs continued OT Services    Follow Up Recommendations  Home health OT    Barriers to Discharge      Equipment Recommendations   (Pt will get her own tub seat.)    Recommendations for Other Services    Frequency  Min 2X/week    Precautions / Restrictions Precautions Precautions: Back;Fall Precaution Booklet Issued: Yes (comment) Precaution Comments: Educated in back precautions Required Braces or Orthoses: Spinal Brace Spinal Brace: Lumbar corset;Applied in sitting position Restrictions Weight Bearing Restrictions: No   Pertinent Vitals/Pain Back pain with movement, did not rate, premedicated, repositioned    ADL  Eating/Feeding: Independent Where Assessed - Eating/Feeding: Chair Grooming: Wash/dry face;Wash/dry hands;Brushing hair;Set up Where Assessed - Grooming: Supported sitting Upper Body Bathing: Set up Where Assessed - Upper Body Bathing: Unsupported sitting Lower Body Bathing: Minimal assistance Where Assessed - Lower Body Bathing: Unsupported sitting Where Assessed - Upper Body Dressing: Unsupported sitting Lower Body Dressing: Minimal assistance Where Assessed - Lower Body Dressing: Unsupported sitting;Supported sit to stand Equipment Used: Back brace;Rolling walker Transfers/Ambulation Related to ADLs: min guard without device, pt with min unsteadiness from past surgery to R LE ADL Comments: Able to access feet crossing them over the opposite knee.    OT Diagnosis: Generalized weakness;Acute pain   OT Problem List: Impaired balance (sitting and/or standing);Decreased knowledge of use of DME or AE;Decreased knowledge of precautions;Pain OT Treatment Interventions: Self-care/ADL training;DME and/or AE instruction;Patient/family education   OT Goals Acute Rehab OT Goals OT Goal Formulation: With patient Time For Goal Achievement: 12/07/12 Potential to Achieve Goals: Good ADL Goals Pt Will Perform Grooming: with modified independence;Standing at sink ADL Goal: Grooming - Progress: Goal set today Pt Will Perform Lower Body Bathing: with modified independence;Sitting, edge of bed;Sit to stand from bed ADL Goal: Lower Body Bathing - Progress: Goal set today Pt Will Perform Lower Body Dressing: with modified independence;Sitting, bed;Sit to stand from bed ADL Goal: Lower Body Dressing - Progress: Goal set today Pt Will Transfer to Toilet: with modified independence;Ambulation;Comfort height toilet;Maintaining back safety precautions ADL Goal: Toilet Transfer - Progress: Goal set today Pt Will Perform Toileting - Clothing Manipulation: with modified independence;Standing ADL Goal: Toileting - Clothing Manipulation - Progress: Goal set today Pt Will Perform Toileting - Hygiene: with modified independence;Sit to stand from 3-in-1/toilet ADL Goal: Toileting - Hygiene - Progress: Goal set today Pt Will Perform Tub/Shower Transfer: with supervision;Shower seat with back;Grab bars;Ambulation;Maintaining back safety precautions ADL Goal: Tub/Shower Transfer - Progress: Goal set today Miscellaneous OT Goals Miscellaneous OT Goal #1: Pt will adhere to back precautions during ADL independently. OT Goal: Miscellaneous Goal #1 - Progress: Goal set today Miscellaneous OT Goal #2: Pt will don back brace independently at EOB. OT Goal: Miscellaneous Goal #2 - Progress: Goal set today  Visit Information  Last OT Received On: 11/30/12 Assistance Needed: +1 PT/OT Co-Evaluation/Treatment: Yes     Subjective Data  Subjective: "I have a girl that can help me at home."   Prior Functioning     Home Living Lives  With: Alone Available Help at Discharge: Neighbor;Friend(s) Type of Home: House Home Access: Stairs to enter Home Layout: Two level;Able to live on main level with bedroom/bathroom Bathroom Shower/Tub: Tub/shower unit;Curtain (very tall wall around tub) Bathroom Toilet: Handicapped height Home Adaptive Equipment: Hand-held shower hose;Grab bars in shower;Reacher Prior Function Level of Independence: Independent Able to Take Stairs?: Yes Driving: Yes Communication Communication: No difficulties Dominant Hand: Right         Vision/Perception Vision - History Baseline Vision: Wears glasses all the time Patient Visual Report: No change from baseline   Cognition  Cognition Overall Cognitive Status: Appears within functional limits for tasks assessed/performed Arousal/Alertness: Awake/alert Orientation Level: Appears intact for tasks assessed Behavior During Session: [facility] for tasks performed    Extremity/Trunk Assessment Right Upper Extremity Assessment RUE ROM/Strength/Tone: [facility] for tasks assessed Left Upper Extremity Assessment LUE ROM/Strength/Tone: WFL for tasks assessed     Mobility Bed Mobility Bed Mobility: Sitting - Scoot to Edge of Bed;Rolling Right;Right Sidelying to Sit Rolling Right: 4: Min assist Right Sidelying to Sit: 4: Min guard;HOB flat Sitting - Scoot to [place] of Bed: 5: Supervision Details for Bed Mobility Assistance: Instructed in log roll technique. Transfers Transfers: Sit to Stand;Stand to Sit Sit to Stand: 4: Min guard;With upper extremity assist;From bed Stand to Sit: 4: Min guard;With upper extremity assist;To chair/3-in-1 Details for Transfer Assistance: verbal cues for hand placement and technique     Exercise     Balance     End of Session OT - End of Session Activity Tolerance: Patient tolerated treatment  well Patient left: in chair;with call bell/phone within reach  GO     Evern Bio 11/30/2012, 10:21 AM [phone]

## 2012-11-30 NOTE — Evaluation (Signed)
Physical Therapy Evaluation Patient Details Name: Renee Cabrera MRN: [id number] DOB: 09-13-1941 Today's Date: 11/30/2012 Time: 4098-1191 PT Time Calculation (min): 27 min  PT Assessment / Plan / Recommendation Clinical Impression  Pt is a 72 yo female s/p PLF x 1 level mobilizing well. Pt with reports of 24/7 assist upon d/c. Anticpate pt to be safe for d/c home with RW and 24/7 supervision. Pt to be seen by acute PT to address stair negotiation, ambulation, and transfers while adhereing to back precautions.    PT Assessment  Patient needs continued PT services    Follow Up Recommendations  Supervision/Assistance - 24 hour    Does the patient have the potential to tolerate intense rehabilitation      Barriers to Discharge None      Equipment Recommendations  Rolling walker with 5" wheels    Recommendations for Other Services     Frequency Min 5X/week    Precautions / Restrictions Precautions Precautions: Back;Fall Precaution Booklet Issued: Yes (comment) Precaution Comments: Educated in back precautions Required Braces or Orthoses: Spinal Brace Spinal Brace: Lumbar corset;Applied in sitting position Restrictions Weight Bearing Restrictions: No   Pertinent Vitals/Pain 5/10 surgical back pain      Mobility  Bed Mobility Bed Mobility: Sitting - Scoot to Edge of Bed;Rolling Right;Right Sidelying to Sit Rolling Right: 4: Min assist Right Sidelying to Sit: 4: Min guard;HOB flat Sitting - Scoot to Edge of Bed: 5: Supervision Details for Bed Mobility Assistance: Instructed in log roll technique. Transfers Transfers: Sit to Stand;Stand to Sit Sit to Stand: 4: Min guard;With upper extremity assist;From bed Stand to Sit: 4: Min guard;With upper extremity assist;To chair/3-in-1 Details for Transfer Assistance: verbal cues for hand placement and technique Ambulation/Gait Ambulation/Gait Assistance: 4: Min assist Ambulation Distance (Feet):  (20 feet without RW, 60 feet  with RW) Assistive device: Rolling walker;None Ambulation/Gait Assistance Details: initially amb without RW and then with RW. Pt with more fluid gait pattern with RW. Pt with guarded antalgic gait pattern without RW. Pt reports feeling more comfortable with RW. Gait Pattern: Step-through pattern;Decreased stride length;Antalgic Gait velocity: guarded Stairs: No    Exercises     PT Diagnosis: Difficulty walking  PT Problem List: Decreased activity tolerance;Decreased balance PT Treatment Interventions: Gait training;Stair training;Functional mobility training;Therapeutic activities;Therapeutic exercise   PT Goals Acute Rehab PT Goals PT Goal Formulation: With patient Time For Goal Achievement: 12/07/12 Potential to Achieve Goals: Good Pt will Roll Supine to Right Side: with modified independence PT Goal: Rolling Supine to Right Side - Progress: Goal set today Pt will go Supine/Side to Sit: Independently;with HOB 0 degrees PT Goal: Supine/Side to Sit - Progress: Goal set today Pt will go Sit to Supine/Side: with modified independence;with HOB 0 degrees PT Goal: Sit to Supine/Side - Progress: Goal set today Pt will go Sit to Stand: with modified independence;with upper extremity assist (up to RW) PT Goal: Sit to Stand - Progress: Goal set today Pt will Ambulate: >150 feet;with modified independence;with rolling walker PT Goal: Ambulate - Progress: Goal set today Pt will Go Up / Down Stairs: 3-5 stairs;with rail(s);with min assist PT Goal: Up/Down Stairs - Progress: Goal set today Additional Goals Additional Goal #1: Pt independent with recall of 3/3 back precautions and 100% compliant. PT Goal: Additional Goal #1 - Progress: Goal set today  Visit Information  Last PT Received On: 11/30/12 Assistance Needed: +1 PT/OT Co-Evaluation/Treatment: Yes    Subjective Data  Subjective: Pt received supine in bed agreeable to PT  Prior Functioning  Home Living Lives With:  Alone Available Help at Discharge: Neighbor;Friend(s) Type of Home: House Home Access: Stairs to enter [employer] of Steps: 3 Entrance Stairs-Rails: Right Home Layout: Two level;Able to live on main level with bedroom/bathroom Bathroom Shower/Tub: Tub/shower unit;Curtain (very tall wall around tub) Bathroom Toilet: Handicapped height Home Adaptive Equipment: Hand-held shower hose;Grab bars in shower;Reacher Prior Function Level of Independence: Independent Able to Take Stairs?: Yes Driving: Yes Vocation: Retired Musician: No difficulties Dominant Hand: Right    Cognition  Cognition Overall Cognitive Status: Appears within functional limits for tasks assessed/performed Arousal/Alertness: Awake/alert Orientation Level: Appears intact for tasks assessed Behavior During Session: [facility] for tasks performed    Extremity/Trunk Assessment Right Upper Extremity Assessment RUE ROM/Strength/Tone: [facility] for tasks assessed Left Upper Extremity Assessment LUE ROM/Strength/Tone: WFL for tasks assessed Right Lower Extremity Assessment RLE ROM/Strength/Tone: Within functional levels Left Lower Extremity Assessment LLE ROM/Strength/Tone: Within functional levels Trunk Assessment Trunk Assessment: Normal   Balance    End of Session PT - End of Session Equipment Utilized During Treatment: Gait belt;Back brace Activity Tolerance: Patient tolerated treatment well Patient left: in chair;with call bell/phone within reach Nurse Communication: Mobility status  GP     Marcene Brawn 11/30/2012, 11:27 AM  Lewis Shock, PT, DPT Pager #: [phone] Office #: [phone]

## 2012-12-01 NOTE — Progress Notes (Signed)
Physical Therapy Treatment Patient Details Name: Renee Cabrera MRN: [id number] DOB: 1941-10-19 Today's Date: 12/01/2012 Time: 7829-5621 PT Time Calculation (min): 19 min  PT Assessment / Plan / Recommendation Comments on Treatment Session  Pt is a 72 yo female s/p PLF mobilizing well. Pt with good home set up and support and can be d/c'd home when medically appropriate. Will trial stairs tomorrow 12/02/12 to clear for safe entry into home.    Follow Up Recommendations  Supervision/Assistance - 24 hour     Does the patient have the potential to tolerate intense rehabilitation     Barriers to Discharge        Equipment Recommendations  Rolling walker with 5" wheels    Recommendations for Other Services    Frequency Min 5X/week   Plan Discharge plan remains appropriate;Frequency remains appropriate    Precautions / Restrictions Precautions Precautions: Back;Fall Precaution Comments: Pt able to state 2/3 back precautions. Required Braces or Orthoses: Spinal Brace Spinal Brace: Lumbar corset;Applied in sitting position Restrictions Weight Bearing Restrictions: No   Pertinent Vitals/Pain 3/10 surgical back pain    Mobility  Bed Mobility Bed Mobility: Rolling Right;Right Sidelying to Sit;Sitting - Scoot to Edge of Bed Rolling Right: 5: Supervision Right Sidelying to Sit: 5: Supervision Sitting - Scoot to Edge of Bed: 5: Supervision Details for Bed Mobility Assistance: v/c's to reach with L UE simultaneously with LEs Transfers Transfers: Sit to Stand;Stand to Sit Sit to Stand: 4: Min guard;With upper extremity assist;From bed Stand to Sit: 4: Min guard;With upper extremity assist;To bed;To toilet Details for Transfer Assistance: pt used hand rail in bathroom to assist self off toliet with supervision, increased time required Ambulation/Gait Ambulation/Gait Assistance: 4: Min guard Ambulation Distance (Feet): 150 Feet Assistive device: None Ambulation/Gait Assistance  Details: pt freq held onto rail in hallway. pt with guarded/cautious gait pattern Gait Pattern: Step-through pattern;Decreased stride length;Antalgic Gait velocity: guarded Stairs: No Wheelchair Mobility Wheelchair Mobility: No    Exercises     PT Diagnosis:    PT Problem List:   PT Treatment Interventions:     PT Goals Acute Rehab PT Goals PT Goal: Rolling Supine to Right Side - Progress: Progressing toward goal PT Goal: Supine/Side to Sit - Progress: Progressing toward goal PT Goal: Sit to Supine/Side - Progress: Progressing toward goal PT Goal: Sit to Stand - Progress: Progressing toward goal PT Goal: Ambulate - Progress: Progressing toward goal Additional Goals PT Goal: Additional Goal #1 - Progress: Progressing toward goal  Visit Information  Last PT Received On: 12/01/12 Assistance Needed: +1    Subjective Data  Subjective: Pt received supine in bed with report "I was really nauseous this morning but am feeling better."   Cognition  Cognition Overall Cognitive Status: Appears within functional limits for tasks assessed/performed Arousal/Alertness: Awake/alert Orientation Level: Appears intact for tasks assessed Behavior During Session: [facility] for tasks performed    Balance     End of Session PT - End of Session Equipment Utilized During Treatment: Gait belt;Back brace Activity Tolerance: Patient tolerated treatment well Patient left: in bed;with call bell/phone within reach Nurse Communication: Mobility status   GP     Marcene Brawn 12/01/2012, 4:00 PM  Lewis Shock, PT, DPT Pager #: [phone] Office #: [phone]

## 2012-12-01 NOTE — Progress Notes (Signed)
Subjective: Patient reports She's feeling well no leg pain back pain is well controlled she is needing ambulating and voiding  Objective: Vital signs in last 24 hours: Temp:  [97.8 F (36.6 C)-100 F (37.8 C)] 97.8 F (36.6 C) (02/05 0945) Pulse Rate:  [86-94] 90  (02/05 0945) Resp:  [17-20] 18  (02/05 0945) BP: (101-119)/(45-82) 108/82 mmHg (02/05 0945) SpO2:  [95 %-97 %] 96 % (02/05 0945)  Intake/Output from previous day: 02/04 0701 - 02/05 0700 In: 220 [P.O.:220] Out: 2400 [Urine:2400] Intake/Output this shift: Total I/O In: 120 [P.O.:120] Out: -   Strength is 5 out of 5 wound is clean and dry  Lab Results: No results found for this basename: WBC:2,HGB:2,HCT:2,PLT:2 in the last 72 hours BMET No results found for this basename: NA:2,K:2,CL:2,CO2:2,GLUCOSE:2,BUN:2,CREATININE:2,CALCIUM:2 in the last 72 hours  Studies/Results: Dg Lumbar Spine 2-3 Views  11/29/2012  *RADIOLOGY REPORT*  Clinical Data: L4-L5 PLIF.  DG C-ARM 1-60 MIN,LUMBAR SPINE - 2-3 VIEW  Comparison: None.  Findings: Spot fluoroscopic images of the lower lumbar spine assume the presence of five lumbar type vertebral bodies.  Bilateral pedicle screws and interconnecting rods have been placed at L4-L5. Interbody spacer appears well positioned.  No complications are identified.  IMPRESSION: Intraoperative views during L4-L5 PLIF.  No demonstrated complication.   Original Report Authenticated By: Carey Bullocks, M.D.    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  LUMBAR SPINE - 1 VIEW  Comparison: None.  Findings: Surgical instruments are seen posteriorly overlying the spinous processes of L4 and L5.  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  IMPRESSION: Intraoperative localization of L4 and L5 spinous processes.   Original Report Authenticated By: Myles Rosenthal, M.D.    Dg C-arm 1-60 Min  11/29/2012  *RADIOLOGY REPORT*  Clinical Data:  L4-L5 PLIF.  DG C-ARM 1-60 MIN,LUMBAR SPINE - 2-3 VIEW  Comparison: None.  Findings: Spot fluoroscopic images of the lower lumbar spine assume the presence of five lumbar type vertebral bodies.  Bilateral pedicle screws and interconnecting rods have been placed at L4-L5. Interbody spacer appears well positioned.  No complications are identified.  IMPRESSION: Intraoperative views during L4-L5 PLIF.  No demonstrated complication.   Original Report Authenticated By: Carey Bullocks, M.D.     Assessment/Plan: Continue to mobilize with physical and occupational therapy observe another 24 hours possible discharge tomorrow Friday  LOS: 2 days     Deirdre Gryder P 12/01/2012, 1:51 PM

## 2012-12-01 NOTE — Progress Notes (Signed)
Occupational Therapy Treatment Patient Details Name: Renee Cabrera MRN: [id number] DOB: 17-Dec-1940 Today's Date: 12/01/2012 Time: 4098-1191 OT Time Calculation (min): 24 min  OT Assessment / Plan / Recommendation Comments on Treatment Session Pt requiring hand held assist for ambulation, appearing more tentative.  Pt experiencing nausea today.  Good adherence to back precautions.    Follow Up Recommendations  Home health OT    Barriers to Discharge       Equipment Recommendations       Recommendations for Other Services    Frequency     Plan Discharge plan remains appropriate    Precautions / Restrictions Precautions Precautions: Back;Fall Precaution Comments: Pt able to state 2/3 back precautions. Required Braces or Orthoses: Spinal Brace Spinal Brace: Lumbar corset;Applied in sitting position Restrictions Weight Bearing Restrictions: No   Pertinent Vitals/Pain 5/10 back, RN administered meds    ADL  Grooming: Wash/dry hands;Brushing hair;Supervision/safety Where Assessed - Grooming: Unsupported standing Upper Body Dressing: Set up (back brace) Where Assessed - Upper Body Dressing: Unsupported sitting Lower Body Dressing: Modified independent (socks only) Where Assessed - Lower Body Dressing: Unsupported sitting Toilet Transfer: Minimal assistance Toilet Transfer Method: Sit to stand Toilet Transfer Equipment: Comfort height toilet;Grab bars Toileting - Clothing Manipulation and Hygiene: Modified independent Where Assessed - Engineer, mining and Hygiene: Sit to stand from 3-in-1 or toilet Tub/Shower Transfer:  (pt declined due to nausea) Equipment Used: Back brace Transfers/Ambulation Related to ADLs: hand held assist to bathroom and round room    OT Diagnosis:    OT Problem List:   OT Treatment Interventions:     OT Goals ADL Goals Pt Will Perform Grooming: with modified independence;Standing at sink ADL Goal: Grooming - Progress:  Progressing toward goals Pt Will Perform Lower Body Bathing: with modified independence;Sitting, edge of bed;Sit to stand from bed Pt Will Perform Lower Body Dressing: with modified independence;Sitting, bed;Sit to stand from bed ADL Goal: Lower Body Dressing - Progress: Progressing toward goals Pt Will Transfer to Toilet: with modified independence;Ambulation;Comfort height toilet;Maintaining back safety precautions ADL Goal: Toilet Transfer - Progress: Progressing toward goals Pt Will Perform Toileting - Clothing Manipulation: with modified independence;Standing ADL Goal: Toileting - Clothing Manipulation - Progress: Met Pt Will Perform Toileting - Hygiene: with modified independence;Sit to stand from 3-in-1/toilet ADL Goal: Toileting - Hygiene - Progress: Met Pt Will Perform Tub/Shower Transfer: with supervision;Shower seat with back;Grab bars;Ambulation;Maintaining back safety precautions Miscellaneous OT Goals Miscellaneous OT Goal #1: Pt will adhere to back precautions during ADL independently. OT Goal: Miscellaneous Goal #1 - Progress: Met Miscellaneous OT Goal #2: Pt will don back brace independently at EOB. OT Goal: Miscellaneous Goal #2 - Progress: Progressing toward goals  Visit Information  Last OT Received On: 12/01/12 Assistance Needed: +1    Subjective Data      Prior Functioning       Cognition  Cognition Overall Cognitive Status: Appears within functional limits for tasks assessed/performed Arousal/Alertness: Awake/alert Orientation Level: Appears intact for tasks assessed Behavior During Session: [facility] for tasks performed    Mobility  Bed Mobility Bed Mobility: Rolling Right;Right Sidelying to Sit;Sitting - Scoot to Edge of Bed Rolling Right: 5: Supervision Right Sidelying to Sit: 5: Supervision Sitting - Scoot to Edge of Bed: 5: Supervision Details for Bed Mobility Assistance: Pt used log roll technique without cues. Transfers Transfers: Sit to Stand;Stand  to Sit Sit to Stand: 4: Min assist;With upper extremity assist;From bed;From toilet Stand to Sit: 4: Min assist;With upper extremity assist;To toilet;To  chair/3-in-1    Exercises      Balance     End of Session OT - End of Session Equipment Utilized During Treatment: Back brace Activity Tolerance:  (limited by nausea) Patient left: in chair;with call bell/phone within reach Nurse Communication: Patient requests pain meds  GO     Evern Bio 12/01/2012, 12:40 PM [phone]

## 2012-12-02 MED ORDER — BISACODYL 10 MG RE SUPP
10.0000 mg | Freq: Every day | RECTAL | Status: DC | PRN
Start: 2012-12-02 — End: 2012-12-02

## 2012-12-02 MED ORDER — POLYETHYLENE GLYCOL 3350 17 G PO PACK
17.0000 g | PACK | Freq: Every day | ORAL | Status: DC
  Administered 2012-12-02 – 2012-12-03 (×2): 17 g via ORAL
  Filled 2012-12-02 (×2): qty 1

## 2012-12-02 NOTE — Progress Notes (Signed)
Occupational Therapy Treatment Patient Details Name: Renee Cabrera MRN: [id number] DOB: [birth year] Today's Date: 12/02/2012 Time: 4098-1191 OT Time Calculation (min): 18 min  OT Assessment / Plan / Recommendation Comments on Treatment Session Pt moving more quickly and less tentative than last visit.  Will have 24 hour assist for 2 weeks upon d/c.  Planning to purchase tub seat with back on her own. Generalizes back precautions.    Follow Up Recommendations  Home health OT    Barriers to Discharge       Equipment Recommendations       Recommendations for Other Services    Frequency Min 2X/week   Plan Discharge plan remains appropriate    Precautions / Restrictions Precautions Precautions: Back;Fall Precaution Comments: Pt following back precautions throughout session. Required Braces or Orthoses: Spinal Brace Spinal Brace: Lumbar corset;Applied in sitting position Restrictions Weight Bearing Restrictions: No   Pertinent Vitals/Pain Back pain, did not rate, premedicated    ADL  Upper Body Dressing: Set up (back brace) Tub/Shower Transfer: Minimal assistance    OT Diagnosis:    OT Problem List:   OT Treatment Interventions:     OT Goals ADL Goals Pt Will Perform Grooming: with modified independence;Standing at sink ADL Goal: Grooming - Progress: Progressing toward goals Pt Will Perform Lower Body Bathing: with modified independence;Sitting, edge of bed;Sit to stand from bed ADL Goal: Lower Body Bathing - Progress: Progressing toward goals Pt Will Perform Lower Body Dressing: with modified independence;Sitting, bed;Sit to stand from bed ADL Goal: Lower Body Dressing - Progress: Progressing toward goals Pt Will Transfer to Toilet: with modified independence;Ambulation;Comfort height toilet;Maintaining back safety precautions ADL Goal: Toilet Transfer - Progress: Progressing toward goals Pt Will Perform Toileting - Clothing Manipulation: with modified  independence;Standing Pt Will Perform Toileting - Hygiene: with modified independence;Sit to stand from 3-in-1/toilet ADL Goal: Toileting - Hygiene - Progress: Met Pt Will Perform Tub/Shower Transfer: with supervision;Shower seat with back;Grab bars;Ambulation;Maintaining back safety precautions ADL Goal: Tub/Shower Transfer - Progress: Progressing toward goals Miscellaneous OT Goals Miscellaneous OT Goal #1: Pt will adhere to back precautions during ADL independently. OT Goal: Miscellaneous Goal #1 - Progress: Met Miscellaneous OT Goal #2: Pt will don back brace independently at EOB. OT Goal: Miscellaneous Goal #2 - Progress: Partly met  Visit Information  Last OT Received On: 12/02/12 Assistance Needed: +1 PT/OT Co-Evaluation/Treatment: Yes    Subjective Data      Prior Functioning       Cognition  Cognition Overall Cognitive Status: Appears within functional limits for tasks assessed/performed Arousal/Alertness: Awake/alert Orientation Level: Appears intact for tasks assessed Behavior During Session: [facility] for tasks performed    Mobility  Bed Mobility Bed Mobility: Rolling Right;Right Sidelying to Sit;Sitting - Scoot to Edge of Bed Rolling Right: 6: Modified independent (Device/Increase time) Right Sidelying to Sit: 6: Modified independent (Device/Increase time) Sitting - Scoot to Edge of Bed: 6: Modified independent (Device/Increase time) Transfers Transfers: Sit to Stand;Stand to Sit Sit to Stand: 4: Min guard;With upper extremity assist;From bed;Other (comment) (from tub seat) Stand to Sit: 4: Min guard;With upper extremity assist (to tub seat) Details for Transfer Assistance: hand held assist for stepping over edge of tub    Exercises      Balance     End of Session OT - End of Session Equipment Utilized During Treatment: Back brace Activity Tolerance: Patient tolerated treatment well Patient left:  (with PT in hall)  GO     Evern Bio 12/02/2012,  11:53  AM [phone]

## 2012-12-02 NOTE — Progress Notes (Signed)
Physical Therapy Treatment Patient Details Name: Renee Cabrera MRN: [id number] DOB: 05-29-1941 Today's Date: 12/02/2012 Time: 4098-1191 PT Time Calculation (min): 23 min  PT Assessment / Plan / Recommendation Comments on Treatment Session  Pt making steady progress with mobility at this date.  Very pleasant & motivated.  Cont' to encourage OOB activity with [place] while here in hospital.   Pt able to increase ambulation distance & negotiated stairs this session.      Follow Up Recommendations  Supervision/Assistance - 24 hour     Does the patient have the potential to tolerate intense rehabilitation     Barriers to Discharge        Equipment Recommendations  Rolling walker with 5" wheels    Recommendations for Other Services    Frequency Min 5X/week   Plan Discharge plan remains appropriate;Frequency remains appropriate    Precautions / Restrictions Precautions Precautions: Back;Fall Precaution Comments: Pt following back precautions throughout session. Required Braces or Orthoses: Spinal Brace Spinal Brace: Lumbar corset;Applied in sitting position Restrictions Weight Bearing Restrictions: No       Mobility  Bed Mobility Bed Mobility: Rolling Right;Right Sidelying to Sit;Sitting - Scoot to Edge of Bed Rolling Right: 6: Modified independent (Device/Increase time) Right Sidelying to Sit: 6: Modified independent (Device/Increase time) Sitting - Scoot to Edge of Bed: 6: Modified independent (Device/Increase time) Transfers Transfers: Sit to Stand;Stand to Sit Sit to Stand: 4: Min guard;With upper extremity assist;From bed;From toilet (tub set) Stand to Sit: 4: Min guard;With upper extremity assist;To chair/3-in-1;To toilet (to tub seat) Details for Transfer Assistance: hand held assist for stepping over edge of tub Ambulation/Gait Ambulation/Gait Assistance: 4: Min guard Ambulation Distance (Feet): 300 Feet Assistive device: None Ambulation/Gait Assistance Details: Pt  with increased smoothness of gait & balance as distance increased.  Has tendency to reach for environmental surfaces for support-- encouraged use of cane at home if she cont's to feel need for support.  Guarded/cautious gait pattern Gait Pattern: Step-through pattern;Decreased stride length Gait velocity: guarded Stairs: Yes Stairs Assistance: 4: Min guard Stair Management Technique: One rail Right;Step to pattern;Forwards Number of Stairs: 5  Wheelchair Mobility Wheelchair Mobility: No      PT Goals Acute Rehab PT Goals Time For Goal Achievement: 12/07/12 Potential to Achieve Goals: Good Pt will Roll Supine to Right Side: with modified independence PT Goal: Rolling Supine to Right Side - Progress: Met Pt will go Supine/Side to Sit: Independently;with HOB 0 degrees Pt will go Sit to Supine/Side: with modified independence;with HOB 0 degrees Pt will go Sit to Stand: with modified independence;with upper extremity assist PT Goal: Sit to Stand - Progress: Progressing toward goal Pt will Ambulate: >150 feet;with modified independence;with rolling walker PT Goal: Ambulate - Progress: Progressing toward goal Pt will Go Up / Down Stairs: 3-5 stairs;with rail(s);with min assist PT Goal: Up/Down Stairs - Progress: Met Additional Goals Additional Goal #1: Pt independent with recall of 3/3 back precautions and 100% compliant. PT Goal: Additional Goal #1 - Progress: Met  Visit Information  Last PT Received On: 12/02/12 Assistance Needed: +1    Subjective Data      Cognition  Cognition Overall Cognitive Status: Appears within functional limits for tasks assessed/performed Arousal/Alertness: Awake/alert Orientation Level: Appears intact for tasks assessed Behavior During Session: [facility] for tasks performed    Balance     End of Session PT - End of Session Equipment Utilized During Treatment: Gait belt;Back brace Activity Tolerance: Patient tolerated treatment well Patient left: in  chair;with  call bell/phone within reach Nurse Communication: Mobility status     Verdell Face, [place] [phone] 12/02/2012

## 2012-12-02 NOTE — Progress Notes (Signed)
Subjective: Patient reports She's doing great she had good night she still has no leg pain her back pain is manageable but she still not have bowel movement he still does not feel independent enough to go home.  Objective: Vital signs in last 24 hours: Temp:  [97.8 F (36.6 C)-99.3 F (37.4 C)] 98.6 F (37 C) (02/06 0539) Pulse Rate:  [88-99] 91  (02/06 0539) Resp:  [18-20] 20  (02/06 0539) BP: (63-119)/(35-82) 119/56 mmHg (02/06 0539) SpO2:  [95 %-98 %] 97 % (02/06 0539)  Intake/Output from previous day: 02/05 0701 - 02/06 0700 In: 360 [P.O.:360] Out: -  Intake/Output this shift:    Strength out of 5 wound is clean and dry  Lab Results: No results found for this basename: WBC:2,HGB:2,HCT:2,PLT:2 in the last 72 hours BMET No results found for this basename: NA:2,K:2,CL:2,CO2:2,GLUCOSE:2,BUN:2,CREATININE:2,CALCIUM:2 in the last 72 hours  Studies/Results: No results found.  Assessment/Plan: Continue physical outpatient therapy work on laxative of choice to improve bowels probable discharge tomorrow  LOS: 3 days     Bardia Wangerin P 12/02/2012, 8:46 AM

## 2012-12-03 MED ORDER — CYCLOBENZAPRINE HCL 10 MG PO TABS: 10.0000 mg | ORAL_TABLET | Freq: Three times a day (TID) | ORAL | Status: DC | PRN

## 2012-12-03 MED ORDER — OXYCODONE-ACETAMINOPHEN 5-325 MG PO TABS: 1.0000 | ORAL_TABLET | ORAL | Status: DC | PRN

## 2012-12-03 NOTE — Discharge Summary (Signed)
  Physician Discharge Summary  Patient ID: Renee Cabrera MRN: [id number] DOB/AGE: October 21, 1941 72 y.o.  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 By postop day 4 patient stable to be discharged home scheduled followup approximately 1-2 weeks with Dr. Danielle Dess patient be discharged on oxycodone and Flexeril.  Consults: Significant Diagnostic Studies: Treatments: Decompressive lumbar laminectomy and fusion Discharge Exam: Blood pressure 110/46, pulse 76, temperature 98.4 F (36.9 C), temperature source Oral, resp. rate 17, height 5' (1.524 m), weight 45 kg (99 lb 3.3 oz), SpO2 95.00%. Strength out of 5 wound clean and dry  Disposition: Home     Medication List     As of 12/03/2012  9:47 AM    TAKE these medications         aspirin 81 MG tablet   Take 81 mg by mouth daily.      CALTRATE 600 PO   Take 1 tablet by mouth 2 (two) times daily.      cholecalciferol 1000 UNITS tablet   Commonly known as: VITAMIN D   Take 1,000 Units by mouth daily.      citalopram 20 MG tablet   Commonly known as: CELEXA   Take 20 mg by mouth daily.      cyclobenzaprine 10 MG tablet   Commonly known as: FLEXERIL   Take 1 tablet (10 mg total) by mouth 3 (three) times daily as needed for muscle spasms.      EXCEDRIN MIGRAINE PO   Take 1 tablet by mouth daily as needed. For migraines      HYDROcodone-acetaminophen 5-325 MG per tablet   Commonly known as:  NORCO/VICODIN   Take 1 tablet by mouth every 6 (six) hours as needed. For pain      lisinopril 2.5 MG tablet   Commonly known as: PRINIVIL,ZESTRIL   Take 2.5 mg by mouth daily.      meloxicam 7.5 MG tablet   Commonly known as: MOBIC   Take 7.5 mg by mouth daily.      mirtazapine 30 MG tablet   Commonly known as: REMERON   Take 30 mg by mouth at bedtime.      oxyCODONE-acetaminophen 5-325 MG per tablet   Commonly known as: PERCOCET/ROXICET   Take 1-2 tablets by mouth every 4 (four) hours as needed.      vitamin C 1000 MG tablet   Take 1,000 mg by mouth 2 (two) times daily.         Signed: Nasiya Pascual P 12/03/2012, 9:47 AM

## 2012-12-03 NOTE — Progress Notes (Signed)
Pt d/c to home by car with family. Pt assessment stable prescriptions given. Pt verbalizes understanding of d/c instructions.

## 2012-12-03 NOTE — Care Management Note (Signed)
    Page 1 of 1   12/03/2012     12:59:10 PM   CARE MANAGEMENT NOTE 12/03/2012  Patient:  Renee Cabrera, Renee Cabrera   Account Number:  [id number]  Date Initiated:  11/30/2012  Documentation initiated by:  [facility]  Subjective/Objective Assessment:   admitted postop L4-5 PLIF     Action/Plan:   PT/OT evals-no follow up recommended,  recommended rolling walker and tub bench   Anticipated DC Date:  12/03/2012   Anticipated DC Plan:  HOME/SELF CARE      DC Planning Services  CM consult      Choice offered to / List presented to:     DME arranged  3-N-1  TUB BENCH      DME agency  [facility]        Status of service:  Completed, signed off Medicare Important Message given?   (If response is "NO", the following Medicare IM given date fields will be blank) Date Medicare IM given:   Date Additional Medicare IM given:    Discharge Disposition:  HOME/SELF CARE  Per UR Regulation:  Reviewed for med. necessity/level of care/duration of stay  If discussed at Long Length of Stay Meetings, dates discussed:    Comments:

## 2012-12-03 NOTE — Progress Notes (Signed)
Subjective: Patient reports She's done great she rated him  Objective: Vital signs in last 24 hours: Temp:  [98.2 F (36.8 C)-98.7 F (37.1 C)] 98.4 F (36.9 C) (02/07 0556) Pulse Rate:  [71-102] 76  (02/07 0556) Resp:  [16-18] 17  (02/07 0556) BP: (101-118)/(46-53) 110/46 mmHg (02/07 0556) SpO2:  [94 %-98 %] 95 % (02/07 0556)  Intake/Output from previous day: 02/06 0701 - 02/07 0700 In: 103 [P.O.:100; I.V.:3] Out: -  Intake/Output this shift: Total I/O In: 240 [P.O.:240] Out: -   Awake alert oriented strength out of 5 wound clean and dry  Lab Results: No results found for this basename: WBC:2,HGB:2,HCT:2,PLT:2 in the last 72 hours BMET No results found for this basename: NA:2,K:2,CL:2,CO2:2,GLUCOSE:2,BUN:2,CREATININE:2,CALCIUM:2 in the last 72 hours  Studies/Results: No results found.  Assessment/Plan: Discharge home  LOS: 4 days     Renee Cabrera P 12/03/2012, 9:45 AM

## 2012-12-03 NOTE — Progress Notes (Signed)
Physical Therapy Treatment Patient Details Name: Renee Cabrera MRN: [id number] DOB: 05/27/1941 Today's Date: 12/03/2012 Time: 5621-3086 PT Time Calculation (min): 13 min  PT Assessment / Plan / Recommendation Comments on Treatment Session  Pt continues to make steady progress with all mobility and does well with stair training.  Pt to D/C today.     Follow Up Recommendations  Supervision/Assistance - 24 hour     Does the patient have the potential to tolerate intense rehabilitation     Barriers to Discharge        Equipment Recommendations  Rolling walker with 5" wheels    Recommendations for Other Services    Frequency Min 5X/week   Plan Discharge plan remains appropriate;Frequency remains appropriate    Precautions / Restrictions Precautions Precautions: Back;Fall Precaution Booklet Issued: Yes (comment) Precaution Comments: Pt able to recall 3/3 precautions today.  Required Braces or Orthoses: Spinal Brace Spinal Brace: Lumbar corset;Applied in sitting position Restrictions Weight Bearing Restrictions: No Other Position/Activity Restrictions: Pt able to don brace independently.    Pertinent Vitals/Pain No pain     Mobility  Bed Mobility Bed Mobility: Rolling Right;Right Sidelying to Sit Rolling Right: 6: Modified independent (Device/Increase time) Right Sidelying to Sit: 6: Modified independent (Device/Increase time) Details for Bed Mobility Assistance: increased time Transfers Transfers: Sit to Stand;Stand to Sit Sit to Stand: 5: Supervision;With upper extremity assist;From bed Stand to Sit: 6: Modified independent (Device/Increase time);With upper extremity assist;With armrests;To chair/3-in-1 Details for Transfer Assistance: min cues for hand placement when standing.  Ambulation/Gait Ambulation/Gait Assistance: 5: Supervision;4: Min guard Ambulation Distance (Feet): 350 Feet Assistive device: None Ambulation/Gait Assistance Details: Pt with some  instability, however able to use handrail on wall intermittently for support.  Min/guard for safety and min cues for upright posture throughout.  Gait Pattern: Step-through pattern;Decreased stride length Gait velocity: guarded Stairs: Yes Stairs Assistance: 5: Supervision Stairs Assistance Details (indicate cue type and reason): Supervision for safety with min cues for technique and safety.  Stair Management Technique: One rail Right;Step to pattern;Forwards Number of Stairs: 5     Exercises     PT Diagnosis:    PT Problem List:   PT Treatment Interventions:     PT Goals Acute Rehab PT Goals PT Goal Formulation: With patient Time For Goal Achievement: 12/07/12 Potential to Achieve Goals: Good Pt will Roll Supine to Right Side: with modified independence PT Goal: Rolling Supine to Right Side - Progress: Met Pt will go Sit to Supine/Side: with modified independence;with HOB 0 degrees PT Goal: Sit to Supine/Side - Progress: Progressing toward goal Pt will go Sit to Stand: with modified independence;with upper extremity assist PT Goal: Sit to Stand - Progress: Met Pt will Ambulate: >150 feet;with modified independence;with rolling walker PT Goal: Ambulate - Progress: Progressing toward goal Pt will Go Up / Down Stairs: 3-5 stairs;with rail(s);with min assist PT Goal: Up/Down Stairs - Progress: Met Additional Goals Additional Goal #1: Pt independent with recall of 3/3 back precautions and 100% compliant. PT Goal: Additional Goal #1 - Progress: Met  Visit Information  Last PT Received On: 12/03/12 Assistance Needed: +1    Subjective Data  Subjective: I'm feeling good and I'm ready to get back home.    Cognition  Cognition Overall Cognitive Status: Appears within functional limits for tasks assessed/performed Arousal/Alertness: Awake/alert Orientation Level: Appears intact for tasks assessed Behavior During Session: [facility] for tasks performed    Balance     End of Session PT  - End of  Session Equipment Utilized During Treatment: Back brace Activity Tolerance: Patient tolerated treatment well Patient left: in chair;with call bell/phone within reach Nurse Communication: Mobility status   GP     Vista Deck 12/03/2012, 10:36 AM

## 2013-02-08 ENCOUNTER — Inpatient Hospital Stay: Payer: Self-pay | Admitting: Internal Medicine

## 2013-02-08 LAB — URINALYSIS, COMPLETE
Hyaline Cast: 14
Nitrite: NEGATIVE
Ph: 5 (ref 4.5–8.0)
WBC UR: 6 /HPF (ref 0–5)

## 2013-02-08 LAB — COMPREHENSIVE METABOLIC PANEL
BUN: 28 mg/dL — ABNORMAL HIGH (ref 7–18)
Calcium, Total: 8.7 mg/dL (ref 8.5–10.1)
EGFR (African American): >60
EGFR (Non-African Amer.): >60
Osmolality: 286 (ref 275–301)
Potassium: 4.2 mmol/L (ref 3.5–5.1)
Sodium: 141 mmol/L (ref 136–145)
Total Protein: 6.8 g/dL (ref 6.4–8.2)

## 2013-02-08 LAB — CBC
Platelet: 448 10*3/uL — ABNORMAL HIGH (ref 150–440)
RBC: 2.73 10*6/uL — ABNORMAL LOW (ref 3.80–5.20)
WBC: 11.6 10*3/uL — ABNORMAL HIGH (ref 3.6–11.0)

## 2013-02-08 LAB — LIPASE, BLOOD: Lipase: 378 U/L (ref 73–393)

## 2013-02-09 LAB — CBC WITH DIFFERENTIAL/PLATELET
Basophil %: 0.7 %
Eosinophil %: 1.6 %
Lymphocyte #: 2.7 10*3/uL (ref 1.0–3.6)
Lymphocyte %: 33 %
MCHC: 32.5 g/dL (ref 32.0–36.0)
MCV: 89 fL (ref 80–100)
Monocyte #: 0.7 x10 3/mm (ref 0.2–0.9)
Monocyte %: 8.2 %
Neutrophil #: 4.6 10*3/uL (ref 1.4–6.5)
Platelet: 307 10*3/uL (ref 150–440)
RDW: 15 % — ABNORMAL HIGH (ref 11.5–14.5)

## 2013-02-09 LAB — BASIC METABOLIC PANEL
Anion Gap: 4 — ABNORMAL LOW (ref 7–16)
BUN: 20 mg/dL — ABNORMAL HIGH (ref 7–18)
Calcium, Total: 7.5 mg/dL — ABNORMAL LOW (ref 8.5–10.1)
Creatinine: 0.58 mg/dL — ABNORMAL LOW (ref 0.60–1.30)
EGFR (African American): >60
EGFR (Non-African Amer.): >60
Glucose: 80 mg/dL (ref 65–99)

## 2013-02-09 LAB — HEMOGLOBIN: HGB: 7.9 g/dL — ABNORMAL LOW (ref 12.0–16.0)

## 2013-02-09 LAB — MAGNESIUM: Magnesium: 2 mg/dL

## 2013-02-10 LAB — CBC WITH DIFFERENTIAL/PLATELET
Eosinophil #: 0.3 10*3/uL (ref 0.0–0.7)
Eosinophil %: 4 %
Lymphocyte #: 2.3 10*3/uL (ref 1.0–3.6)
MCHC: 33.3 g/dL (ref 32.0–36.0)
MCV: 86 fL (ref 80–100)
Neutrophil #: 4.6 10*3/uL (ref 1.4–6.5)
Platelet: 315 10*3/uL (ref 150–440)

## 2013-02-11 LAB — CBC WITH DIFFERENTIAL/PLATELET
Basophil %: 0.7 %
Eosinophil %: 5.5 %
HCT: 24.4 % — ABNORMAL LOW (ref 35.0–47.0)
HGB: 7.9 g/dL — ABNORMAL LOW (ref 12.0–16.0)
Lymphocyte #: 1.8 10*3/uL (ref 1.0–3.6)
Lymphocyte %: 26.1 %
MCHC: 32.4 g/dL (ref 32.0–36.0)
Monocyte #: 0.7 x10 3/mm (ref 0.2–0.9)
Monocyte %: 9.5 %
Neutrophil %: 58.2 %

## 2013-02-12 LAB — HEMOGLOBIN: HGB: 8.3 g/dL — ABNORMAL LOW (ref 12.0–16.0)

## 2013-03-09 ENCOUNTER — Ambulatory Visit: Payer: Self-pay | Admitting: Internal Medicine

## 2013-03-27 ENCOUNTER — Ambulatory Visit: Payer: Self-pay | Admitting: Internal Medicine

## 2013-05-31 ENCOUNTER — Ambulatory Visit: Payer: Self-pay | Admitting: Internal Medicine

## 2013-06-01 LAB — CBC CANCER CENTER
Basophil %: 0.9 %
Eosinophil #: 0.1 x10 3/mm (ref 0.0–0.7)
HCT: 41.8 % (ref 35.0–47.0)
Lymphocyte %: 26.3 %
MCH: 30.6 pg (ref 26.0–34.0)
MCHC: 33.4 g/dL (ref 32.0–36.0)
MCV: 92 fL (ref 80–100)
Monocyte #: 0.5 x10 3/mm (ref 0.2–0.9)
Platelet: 262 x10 3/mm (ref 150–440)
RBC: 4.55 10*6/uL (ref 3.80–5.20)

## 2013-06-01 LAB — IRON AND TIBC
Iron Bind.Cap.(Total): 237 ug/dL — ABNORMAL LOW (ref 250–450)
Iron Saturation: 37 %

## 2013-06-27 ENCOUNTER — Ambulatory Visit: Payer: Self-pay | Admitting: Internal Medicine

## 2013-08-17 ENCOUNTER — Ambulatory Visit: Payer: Self-pay | Admitting: Family Medicine

## 2013-09-21 ENCOUNTER — Ambulatory Visit: Payer: Self-pay | Admitting: Internal Medicine

## 2013-09-21 LAB — FERRITIN: Ferritin (ARMC): 143 ng/mL (ref 8–388)

## 2013-09-21 LAB — IRON AND TIBC: Iron Saturation: 28 %

## 2013-09-26 ENCOUNTER — Ambulatory Visit: Payer: Self-pay | Admitting: Internal Medicine

## 2014-01-10 ENCOUNTER — Ambulatory Visit: Payer: Self-pay | Admitting: Internal Medicine

## 2014-01-11 LAB — IRON AND TIBC
IRON: 116 ug/dL (ref 50–170)
Iron Bind.Cap.(Total): 282 ug/dL (ref 250–450)
Iron Saturation: 41 %
UNBOUND IRON-BIND. CAP.: 166 ug/dL

## 2014-01-11 LAB — FERRITIN: Ferritin (ARMC): 109 ng/mL (ref 8–388)

## 2014-01-11 LAB — CANCER CENTER HEMOGLOBIN: HGB: 14 g/dL (ref 12.0–16.0)

## 2014-01-25 ENCOUNTER — Ambulatory Visit: Payer: Self-pay | Admitting: Internal Medicine

## 2014-05-03 ENCOUNTER — Ambulatory Visit: Payer: Self-pay | Admitting: Internal Medicine

## 2014-05-03 LAB — CBC CANCER CENTER
BASOS ABS: 0.1 x10 3/mm (ref 0.0–0.1)
BASOS PCT: 1 %
EOS PCT: 1.1 %
Eosinophil #: 0.1 x10 3/mm (ref 0.0–0.7)
HCT: 44 % (ref 35.0–47.0)
HGB: 14.3 g/dL (ref 12.0–16.0)
LYMPHS PCT: 19.7 %
Lymphocyte #: 1.8 x10 3/mm (ref 1.0–3.6)
MCH: 31.9 pg (ref 26.0–34.0)
MCHC: 32.5 g/dL (ref 32.0–36.0)
MCV: 98 fL (ref 80–100)
Monocyte #: 0.5 x10 3/mm (ref 0.2–0.9)
Monocyte %: 5.4 %
NEUTROS ABS: 6.6 x10 3/mm — AB (ref 1.4–6.5)
Neutrophil %: 72.8 %
Platelet: 275 x10 3/mm (ref 150–440)
RBC: 4.48 10*6/uL (ref 3.80–5.20)
RDW: 13.5 % (ref 11.5–14.5)
WBC: 9.1 x10 3/mm (ref 3.6–11.0)

## 2014-05-03 LAB — IRON AND TIBC
IRON BIND. CAP.(TOTAL): 258 ug/dL (ref 250–450)
Iron Saturation: 31 %
Iron: 80 ug/dL (ref 50–170)
Unbound Iron-Bind.Cap.: 178 ug/dL

## 2014-05-03 LAB — FERRITIN: Ferritin (ARMC): 123 ng/mL (ref 8–388)

## 2014-05-27 ENCOUNTER — Ambulatory Visit: Payer: Self-pay | Admitting: Internal Medicine

## 2014-08-30 ENCOUNTER — Ambulatory Visit: Payer: Self-pay | Admitting: Family Medicine

## 2014-11-03 ENCOUNTER — Ambulatory Visit: Payer: Self-pay | Admitting: Internal Medicine

## 2014-11-03 LAB — IRON AND TIBC
IRON BIND. CAP.(TOTAL): 249 ug/dL — AB (ref 250–450)
IRON SATURATION: 28 %
Iron: 69 ug/dL (ref 50–170)
UNBOUND IRON-BIND. CAP.: 180 ug/dL

## 2014-11-03 LAB — CANCER CENTER HEMOGLOBIN: HGB: 13.7 g/dL (ref 12.0–16.0)

## 2014-11-03 LAB — FERRITIN: Ferritin (ARMC): 147 ng/mL (ref 8–388)

## 2014-11-27 ENCOUNTER — Ambulatory Visit: Payer: Self-pay | Admitting: Internal Medicine

## 2015-02-16 NOTE — Consult Note (Signed)
Brief Consult Note: Diagnosis: Dizziness with associated diarrhea, acute onset currently resolved.  Acute anemia on chronic anemia.  Known history of macrocystic anemia.  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  Two year history of chronic NSAID use with no PPI therapy.  Additionally daily ASA and recent narcotic therapy and Excedrin. Heme positive stool.   Comments: Patient's presentation discussed with Dr. Verdie Shire.  Recommend proceeding EGD today to allow direct luminal evaluation of Upper GI tract to assess for possible blood loss.  Depending on endo schedule this afternoon will be the deciding factor of it being done today.  If not able to proceed today then goal is to proceed tomorrow.  This was discussed with patient.  She will remain NP at this time and if not able to proceed today then will change her to clear liquid diet later today.  Continue PPI therapy. Will monitor laboratory studies.  Transfuse as necessary.  Electronic Signatures: Payton Emerald (NP)  (Signed 16-Apr-14 11:48)  Authored: Brief Consult Note   Last Updated: 16-Apr-14 11:48 by Payton Emerald (NP)

## 2015-02-16 NOTE — Consult Note (Signed)
EGD showed Bilroth I gastric surgery. However, no bleeding anywhere in UGI tract. Had normal colon in 2008. Nevertheless, will need colonoscopy as long as bleeding continues. I am away until Monday. Dr. Allen Norris will cover this weekend and may need to schedule colon this weekend. Thanks.  Electronic Signatures: Verdie Shire (MD)  (Signed on 17-Apr-14 14:55)  Authored  Last Updated: 17-Apr-14 14:55 by Verdie Shire (MD)

## 2015-02-16 NOTE — Consult Note (Signed)
Chief Complaint:  Subjective/Chief Complaint The patient has been doing well without any complaints. She had a dark brown bowel movement today but no bright red blood.   VITAL SIGNS/ANCILLARY NOTES: **Vital Signs.:   18-Apr-14 05:01  Vital Signs Type Routine  Temperature Temperature (F) 97.8  Celsius 36.5  Temperature Source oral  Pulse Pulse 79  Respirations Respirations 18  Systolic BP Systolic BP 128  Diastolic BP (mmHg) Diastolic BP (mmHg) 76  Mean BP 99  Pulse Ox % Pulse Ox % 98  Pulse Ox Activity Level  At rest  Oxygen Delivery Room Air/ 21 %    05:11  Vital Signs Type Routine  Temperature Temperature (F) 97.8  Celsius 36.5  Pulse Pulse 79  Respirations Respirations 18  Systolic BP Systolic BP 786  Diastolic BP (mmHg) Diastolic BP (mmHg) 76  Mean BP 99  Pulse Ox % Pulse Ox % 98  Pulse Ox Activity Level  At rest  Oxygen Delivery Room Air/ 21 %   Brief Assessment:  Gastrointestinal Normal   Gastrointestinal details normal Soft  No gaurding  No rigidity  No organomegaly   Lab Results: Routine Hem:  15-Apr-14 15:58   Hemoglobin (CBC)  7.5  16-Apr-14 05:15   Hemoglobin (CBC)  6.2    13:22   Hemoglobin (CBC)  7.9 (Result(s) reported on 09 Feb 2013 at 01:47PM.)    18:42   Hemoglobin (CBC)  8.0 (Result(s) reported on 09 Feb 2013 at 06:59PM.)  17-Apr-14 05:00   Hemoglobin (CBC)  8.0  18-Apr-14 05:17   Hemoglobin (CBC)  7.9   Assessment/Plan:  Assessment/Plan:  Assessment GI bleed with anemia. Patient with negative EGD.   Plan The patient is not having any complaints. Her Hb. is stable. Will plan for colonoscopy in am.   Electronic Signatures: Lucilla Lame (MD)  (Signed 18-Apr-14 10:05)  Authored: Chief Complaint, VITAL SIGNS/ANCILLARY NOTES, Brief Assessment, Lab Results, Assessment/Plan   Last Updated: 18-Apr-14 10:05 by Lucilla Lame (MD)

## 2015-02-16 NOTE — Consult Note (Signed)
Pt seen and examined. Please see Renee Cabrera's notes. Acute diarrhea, bloody? Heme positive stool NSAIDS use. Hx of gastric ulcers. Due to today's endo schedule, unable to get EGD done today. Will plan tomorrow. Thanks.  Electronic Signatures: Verdie Shire (MD)  (Signed on 16-Apr-14 16:45)  Authored  Last Updated: 16-Apr-14 16:45 by Verdie Shire (MD)

## 2015-02-16 NOTE — Discharge Summary (Signed)
PATIENT NAME:  Renee Cabrera, Renee Cabrera MR#:  [id number] DATE OF BIRTH:  [birth year]  DATE OF ADMISSION:  02/08/2013 DATE OF DISCHARGE:  02/12/2013   ADMISSION DIAGNOSES:  1.  Acute on chronic symptomatic anemia with acute blood loss.  2.  Hypertension.   CONSULTANTS: Gastroenterology.   LABORATORY DATA AND PROCEDURES:  1.  The patient underwent an EGD on 02/10/2013 with normal esophagus, normal duodenum.  2.  Colonoscopy was performed on 02/12/2013, which showed internal hemorrhoids and some diverticulosis. 3.  Discharge hemoglobin was 8.3.   HOSPITAL COURSE: This is a very pleasant 74 year old female with a history of gastrectomy who presented with symptomatic anemia. For further details, please refer to the H and P.  1.  Acute on chronic symptomatic anemia: The patient did have 1 unit of packed red blood cells. Her hemoglobin has remained stable. She was having bloody stools. She underwent an EGD, which did not show etiology for GI bleeding. She also had a colonoscopy, which basically showed some internal hemorrhoids but no acute bleeding. She will follow up as an outpatient. She has had this GI bleeding in the past without any etiology as well. Her hemoglobin has remained stable. She will continue PPI. Will avoid aspirin or NSAIDs at this point until followup with GI.  2.  Hypertension: Her blood pressure was controlled on her outpatient medications.   DISCHARGE MEDICATIONS:  1.  Lisinopril 5 mg 1/2 tablet daily.  2.  Vitamin C 1 tablet 500 mg b.i.d.  3.  Remeron 30 mg at bedtime.  4.  Vitamin D3, 1000 international units daily.  5.  ProAir 2 puffs q.6 hours p.r.n.  6.  Calcium and vitamin D 1 tablet b.i.d.  7.  Pantoprazole 40 mg daily.   DISCHARGE DIET: Regular, high-fiber diet.   DISCHARGE ACTIVITY: As tolerated.   DISCHARGE FOLLOWUP: With [facility] in 1 to 2 weeks, Dr. Candace Cruise. She will avoid NSAIDs and aspirin until then.   TIME SPENT: Approximately 35 minutes.    ____________________________ Donell Beers. Benjie Karvonen, MD spm:jm D: 02/12/2013 12:23:29 ET T: 02/12/2013 14:52:47 ET JOB#: [id number]  cc: Charlii Yost P. Benjie Karvonen, MD, <Dictator> Lupita Dawn. Candace Cruise, MD Donell Beers Aldred Mase MD ELECTRONICALLY SIGNED 02/13/2013 12:42

## 2015-02-16 NOTE — H&P (Signed)
DATE OF BIRTH:  1941-02-07  DATE OF ADMISSION:  02/08/2013  PRIMARY CARE PHYSICIAN:  Dr. Kary Kos  REFERRING PHYSICIAN:  Dr. Jasmine December  CHIEF COMPLAINT:  Dizziness, weakness and diarrhea for 3 days.   HISTORY OF PRESENT ILLNESS: A 74 year old Caucasian female with a history of hypertension, lupus, asthma, PUD with GI bleeding in the past, and macrocytic anemia, presented to the ED with the above chief complaint. The patient is alert, awake, oriented, in no acute distress. The patient said that she developed one episode of diarrhea 3 days ago, and then the patient felt dizzy, weak, and has shortness of breath on exertion. The patient developed dizziness, weakness and shortness of breath on exertion again today, so she came to the ED for further evaluation. She was noted to have bloody stool in ED, and hemoglobin is 7.5. Her hemoglobin 2 years ago was 9.9. The patient is being admitted for GI bleeding.   PAST MEDICAL HISTORY: Hypertension, lupus, cataract, asthma, hyperglycemia, PUD, anemia.   PAST SURGICAL HISTORY: Tonsillectomy, reconstructive stomach surgery in 1973, C2-C4 neck surgery, hardware placed in 2009.    FAMILY HISTORY: Father with bladder cancer, mother with a hip problem, brother has prostate cancer.   SOCIAL HISTORY:  No smoking or drinking or illicit drugs.   ALLERGIES:  SULFA DRUGS.  HOME MEDICATIONS:   1.  Vitamin D3, 1000 international units 1 tab once a day.  2.  Vitamin C 500 mg p.o. b.i.d.  3.  ProAir FHA 2 puffs every 6 hours p.r.n.  4.  Mobic 7.5 mg p.o. once a day.  5.  Mirtazapine 30 mg p.o. once a day at bedtime.  6.  Lisinopril 5 mg p.o. 0.5 tablets once a day.  7.  Calcium 600 Plus D,  600 mg 1 tablet b.i.d. 8.  Aspirin 81 mg p.o. daily.   REVIEW OF SYSTEMS:    CONSTITUTIONAL:  The patient denies any fever or chills. No headache, but has dizziness, weakness.  EYES:  No double vision, blurred vision.  EARS, NOSE, THROAT:  No postnasal drip, slurred speech  or dysphagia. No epistaxis.  CARDIOVASCULAR:  No chest pain, palpitation, orthopnea, nocturnal dyspnea. No leg edema.  GASTROINTESTINAL:  No abdominal pain, nausea, vomiting, but had one episode of diarrhea. Positive for bloody stool, but no black stool.  GENITOURINARY:  No dysuria, hematuria or incontinence.  SKIN:  No rash or jaundice.  HEMATOLOGY:  No easy bruising/bleeding.  ENDOCRINE:  No polyuria, polydipsia, heat or cold intolerance.  NEUROLOGY:  No syncope, loss of consciousness, or seizure.   VITAL SIGNS: Temperature 98.2, blood pressure 112/62, pulse 92, respirations 18, O2 saturation 97% on room air.   PHYSICAL EXAMINATION:   GENERAL:  The patient is alert, awake, oriented, in no acute distress.  HEENT:  Pupils are round, equal, react to light and accommodation. Moist oral mucosa. Clear pharynx.  NECK:  Supple. No JVD or carotid bruits. No lymphadenopathy. No thyromegaly.  CARDIOVASCULAR:  S1, S2. Regular rate and rhythm. No murmurs or gallops.  PULMONARY: Bilateral air entry. No wheezing or rales. No use of accessory muscles to breathe.  ABDOMEN:  Soft. No distention. No tenderness. No organomegaly. Bowel sounds present.  EXTREMITIES: No edema, clubbing or cyanosis. No calf tenderness. Pedal pulses present.  NEUROLOGIC: Alert and oriented x 3. No focal deficit. Power 5/5. Sensation intact.   LABORATORY DATA:  Chest x-ray showed COPD, possible healing fracture posteriorly in the right 5th  rib.  Urinalysis is negative.   WBC 11.6,  hemoglobin 7.5, platelets 448. Lipase 378, glucose 91, BUN 28, creatinine 0.62, sodium 141, potassium 4.2, chloride 110, bicarb 25.   IMPRESSIONS: 1.  Gastrointestinal bleeding.  2.  Possible peptic ulcer disease.  3.  Anemia.  4.  Dehydration.  5.  History of hypertension,   PLAN OF TREATMENT: 1.  The patient will be admitted to the medical floor. We will keep n.p.o. except meds. Start IV fluid support, start IV Protonix twice a day. Hold  aspirin, Mobic and lisinopril due to GI bleeding and low blood pressure.   2.  Will get a GI consult for possible endoscopy.   3.  Follow up hemoglobin q. 6 hours for 24 hours. Get a type and cross. The patient may need  PRBC transfusion.   4.  DVT prophylaxis with TEDs.  Discussed the patient's condition and plan of treatment with the patient.   Patient wants FULL CODE.   TIME SPENT:  About 58 minutes    ____________________________ Demetrios Loll, MD qc:mr D: 02/08/2013 19:51:26 ET T: 02/08/2013 20:12:40 ET JOB#: [id number]  cc: Demetrios Loll, MD, <Dictator> Demetrios Loll MD ELECTRONICALLY SIGNED 02/11/2013 10:17

## 2015-02-16 NOTE — Consult Note (Signed)
PATIENT NAME:  Renee Cabrera, BUIST MR#:  [id number] DATE OF BIRTH:  Nov 21, 1940  DATE OF CONSULTATION:  02/09/2013  REFERRING PHYSICIAN:    CONSULTING PHYSICIAN:  Payton Emerald, NP  PRIMARY CARE PHYSICIAN: Dr. [name].   ATTENDING PHYSICIAN: Dr. Bridgett Larsson   REASON FOR CONSULTATION: GI bleed, anemia.   HISTORY OF PRESENT ILLNESS: The patient is a 74 year old Caucasian female with a significant past medical history of hypertension, lupus, asthma, peptic ulcer disease with GI bleeding in the past, macrocytic anemia, as well as asthma. The patient states that she was her normal state of health up until Saturday evening when she had eaten at her cousin's house. After eating dinner, she got home around 9:00 and at 11:30 started with diarrhea, runny stool 3 times occurrence into early Sunday morning. Last bowel movement was Sunday. The patient denies any associated abdominal pain at that time. She is unable to specifically confirm evidence of bright red blood at that time. Once she presented to the Emergency Room yesterday, rectal examination did reveal evidence of heme-positive stool. She has been experiencing weakness, a swimmy headed feeling. No syncopal episodes. No associated fever, nausea, vomiting or reflux symptoms. Has experienced excessive flatulence and some abdominal bloating. A longstanding history of receiving vitamin B12 injections given her history of gastrectomy in the 1960s. Has received iron injections in the past. In reviewing H and P, hemoglobin was found to be 7.5 on admission, and 2 years ago, range was 9.9. Mild chest discomfort, shortness of breath and ringing in her ears. The patient has a longstanding history of taking Mobic 7.5 mg once a day for the past couple years for arthritic pain, discomfort and pain management prior to having lumbar spinal surgery 11/29/2012. She was taking hydrocodone for approximately 3 to 4 months on a regular basis. Also takes Excedrin as directed as  needed and takes a baby aspirin once a day. In reviewing medication list, the patient has not been on any form of PPI therapy for several years. She had EGD performed by Dr. Gaylyn Cheers on 10/06/2007, which was done for followup of acute gastric ulcer. Esophagus was normal. 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 EGD had been done 07/13/2007 with finding of Billroth I anastomosis being found to the gastric body, characterized by erythema, friable mucosa, inflammation and ulceration. Dark bile was seen entering the duodenum 7 cm distal to the anastomosis. Fourth portion of the duodenum was normal. A pocket slightly distal to the anastomosis with atypical-appearing mucosa was noted, which was biopsied. Duodenal mucosa with intact villi noted on pathology report. Mild chronic inflammation noted. No evidence of intestinal metaplasia. Colonoscopy was also done on this date with finding of a tortuous colon. Internal nonbleeding small hemorrhoids were noted.   PAST MEDICAL HISTORY: Hypertension, lupus, cataracts, asthma, in remission since change in geographical location; hypoglycemia; peptic ulcer disease and chronic anemia, macrocytic.   PAST SURGICAL HISTORY: Gastrectomy with two-thirds of stomach removed in the 1960s with revision 10 years later, tonsillectomy, C2-C3 neck surgery, lumbar spinal surgery, L4-L5, for spinal stenosis 11/29/2012 and then right femur fracture repair in 2012.   FAMILY HISTORY: Father with history of bladder cancer diagnosed at 52. Mother sustained hip fracture and myocardial infarction, deceased. Brother with prostate cancer. No family history of colon cancer or colonic polyps.   SOCIAL HISTORY: No tobacco. No alcohol use.   ALLERGIES: SULFA.   HOME MEDICATIONS: 1.  Vitamin D3 1000 international units one tablet once  a day.  2.  Vitamin C 500 mg twice a day.  3.  ProAir FHA 2 puffs every 6 hours as needed.  4.  Mobic 7.5 mg once a day.  6.   Mirtazapine 30 mg once daily at bedtime.  7.  Lisinopril 5 mg, 0.5 mg tablet once a day.  8.  Excedrin as directed as needed; average use is twice a week.  9.  Calcium 600 mg plus vitamin D 600 mg 1 tablet twice a day.  10. Aspirin 81 mg daily.   REVIEW OF SYSTEMS:  CONSTITUTIONAL: Significant for dizziness, weakness and generalized fatigue.  EYES: No blurred vision or double vision but does wear glasses.  EARS, NOSE AND THROAT: No postnasal drip, slurred speech or dysphasia. No epistaxis. Evidence of ringing in her ears.  CARDIOVASCULAR: Mild chest discomfort with generalized weakness. No edema. No heart palpitations.  PULMONARY: Significant for mild shortness of breath, noted worse with exertion, better with resting.  GASTROINTESTINAL: See history of present illness.  GENITOURINARY: No dysuria or hematuria.  SKIN: No rashes. No lesions or jaundice.  HEMATOLOGICAL: No easy bruising or bleeding.  ENDOCRINE: No polyuria, polydipsia, heat or cold intolerance.  NEUROLOGICAL: No syncopal episodes or seizure disorder.   PHYSICAL EXAMINATION: VITAL SIGNS: Temperature is 98.5, pulse is 80, respirations are 18, blood pressure is 106/66, pulse ox is 98%.  GENERAL: Well-developed, slender 74 year old Caucasian female. No acute distress noted. Pleasant. Color pale.  HEENT: Normocephalic, atraumatic. Pupils equal and reactive to light. Conjunctivae clear. Sclerae anicteric. Evidence of glossitis.  NECK: Supple. Trachea midline. No lymphadenopathy or thyromegaly.  PULMONARY: Symmetric rise and fall of chest. Clear to auscultation throughout.  CARDIOVASCULAR: Regular rhythm, S1, S2. No murmurs. No gallops.  ABDOMEN: Soft, nondistended. Bowel sounds in 4 quadrants. No bruits. No masses. Evidence of incisional scars noted, especially to upper abdomen. No evidence of hepatosplenomegaly.  RECTAL: Deferred.  MUSCULOSKELETAL: No contractures. No clubbing. Moving all 4 extremities.  EXTREMITIES: No edema.   NEUROLOGICAL: No gross neurological deficits noted.  PSYCHIATRIC: Alert and oriented x 4. Memory grossly intact. Appropriate affect and mood.   LABORATORY AND DIAGNOSTIC DATA: Chemistry panel April 15th: BUN was 28, chloride was 110, otherwise within normal limits. Lipase was 378. Comparison chemistry panel today: Creatinine has declined to 0.58, BUN is 20, chloride is elevated at 113, anion gap is 4 and calcium has declined from 8.7 to 7.5. Hepatic panel within normal limits.   CBC on admission: WBC count was 11.6, RBC is 2.73, hemoglobin is 7.5, hematocrit is 23.9, platelet count is 448,000, MCHC is 31.5 with RDW of 15.1. April 16th at 5:15 this morning: Hemoglobin is 6.2, hematocrit is 19.0, RBC is 2.14. Differential within normal limits. Antibody screen is negative. ABO group O plus Rh type is O+. Type and cross for 1 unit, which has been issued. Urinalysis reveals 1+ ketones, 1+ leukocytes. RBC per high-powered field is 2 and WBC per high-powered field is 6. Chest x-ray, PA and lateral, reveals COPD, possible healing fracture posteriorly to the right 5th rib and a calcified granuloma in the right lower lung.   ASSESSMENT: Dizziness with associated diarrhea, acute onset, currently resolved, acute anemia on chronic anemia, known history of macrocytic anemia, chronic nonsteroidal antiinflammatory drug use, meloxicam for the past 2 years with no proton pump inhibitor therapy. Known history of peptic ulcer disease status post gastrectomy. Additionally, daily aspirin use, recent narcotic therapy and Excedrin as needed. Heme-positive stool.   PLAN: The patient's presentation was discussed  with Dr. Verdie Shire. Goal is to proceed forward with an upper endoscopy today to allow direct luminal evaluation to assess her source of possible blood loss given noted drop in hemoglobin and hematocrit. This will be decided if able to proceed this afternoon depending on Dr. Myrna Blazer schedule. Discussed with the patient and  advised her of this, and if not able to proceed today, we will proceed tomorrow. At this time, though, she will remain n.p.o., except for medications. Again, if not able to proceed this afternoon, we will allow her to then start consuming a clear liquid diet this afternoon and evening and then n.p.o. after midnight. We will continue to monitor laboratory studies, transfuse as necessary and PPI therapy to remain as ordered. If upper GI source of potential blood loss is not noted at the time of EGD, may need to consider proceeding forward with a diagnostic colonoscopy. This was also discussed with the patient.   These services provided by Payton Emerald,  MS, APRN, [facility], FNP, under collaborative agreement with Dr. Verdie Shire.   ____________________________ Payton Emerald, NP dsh:lg D: 02/09/2013 11:37:00 ET T: 02/09/2013 11:58:15 ET JOB#: [id number]  cc: Payton Emerald, NP, <Dictator> Payton Emerald MD ELECTRONICALLY SIGNED 02/09/2013 16:13

## 2015-05-04 ENCOUNTER — Inpatient Hospital Stay (HOSPITAL_BASED_OUTPATIENT_CLINIC_OR_DEPARTMENT_OTHER): Payer: Medicare Other | Admitting: Internal Medicine

## 2015-05-04 ENCOUNTER — Inpatient Hospital Stay: Payer: Medicare Other | Attending: Internal Medicine

## 2015-05-04 ENCOUNTER — Other Ambulatory Visit: Payer: Self-pay

## 2015-05-04 VITALS — BP 122/77 | HR 81 | Temp 97.9°F | Resp 18 | Ht 60.0 in | Wt 91.5 lb

## 2015-05-04 DIAGNOSIS — D509 Iron deficiency anemia, unspecified: Secondary | ICD-10-CM

## 2015-05-04 DIAGNOSIS — Z9884 Bariatric surgery status: Secondary | ICD-10-CM | POA: Diagnosis not present

## 2015-05-04 DIAGNOSIS — Z8711 Personal history of peptic ulcer disease: Secondary | ICD-10-CM | POA: Diagnosis not present

## 2015-05-04 DIAGNOSIS — Z79899 Other long term (current) drug therapy: Secondary | ICD-10-CM | POA: Diagnosis not present

## 2015-05-04 DIAGNOSIS — R5382 Chronic fatigue, unspecified: Secondary | ICD-10-CM | POA: Insufficient documentation

## 2015-05-04 DIAGNOSIS — Z8052 Family history of malignant neoplasm of bladder: Secondary | ICD-10-CM | POA: Insufficient documentation

## 2015-05-04 DIAGNOSIS — M1990 Unspecified osteoarthritis, unspecified site: Secondary | ICD-10-CM | POA: Insufficient documentation

## 2015-05-04 DIAGNOSIS — M329 Systemic lupus erythematosus, unspecified: Secondary | ICD-10-CM | POA: Diagnosis not present

## 2015-05-04 DIAGNOSIS — Z7982 Long term (current) use of aspirin: Secondary | ICD-10-CM | POA: Diagnosis not present

## 2015-05-04 DIAGNOSIS — D508 Other iron deficiency anemias: Secondary | ICD-10-CM | POA: Insufficient documentation

## 2015-05-04 DIAGNOSIS — I10 Essential (primary) hypertension: Secondary | ICD-10-CM | POA: Insufficient documentation

## 2015-05-04 LAB — IRON AND TIBC
Iron: 101 ug/dL (ref 28–170)
Saturation Ratios: 33 % — ABNORMAL HIGH (ref 10.4–31.8)
TIBC: 306 ug/dL (ref 250–450)
UIBC: 205 ug/dL

## 2015-05-04 LAB — CBC
HCT: 45.1 % (ref 35.0–47.0)
HEMOGLOBIN: 14.7 g/dL (ref 12.0–16.0)
MCH: 31.2 pg (ref 26.0–34.0)
MCHC: 32.6 g/dL (ref 32.0–36.0)
MCV: 95.7 fL (ref 80.0–100.0)
PLATELETS: 300 10*3/uL (ref 150–440)
RBC: 4.71 MIL/uL (ref 3.80–5.20)
RDW: 13.7 % (ref 11.5–14.5)
WBC: 8.8 10*3/uL (ref 3.6–11.0)

## 2015-05-04 LAB — FERRITIN: Ferritin: 115 ng/mL (ref 11–307)

## 2015-05-14 NOTE — Progress Notes (Signed)
[facility]  [phone]) [phone] Fax:([phone]) [phone]     ID: CHIANTI GOH OB: [birth year]  MR#: [id number]  HQI#:[id number]  Patient Care Team: [name], MD as PCP - General (Family Medicine)  CHIEF COMPLAINT/DIAGNOSIS:  Symptomatic severe iron-deficiency anemia,  status post partial gastrectomy in 1963 with reconstruction in 1973 for peptic ulcer disease. Patient also intolerant of oral iron  -   received parenteral iron therapy with Feraheme 510 mg x 2 doses in May 2014.   HISTORY OF PRESENT ILLNESS:  Patient returns for continued hematology followup with repeat labs, she was seen 1 year ago. States that she is doing fairly steady and denies any progressive fatigue, dyspnea, palpitation or chest pain. Taking oral iron one tablet 3 times a week, states that she can tolerate this and also can only remember to take it this frequently. She has chronic mild fatigue on exertion only. Denies any bleeding symptoms. No new bone pains. States that she is eating better, has gained some weight (4 pounds since last visit here). No new paresthesias in the extremities.  REVIEW OF SYSTEMS:   ROS As in HPI above. In addition, no fever, chills or sweats. No new headaches or focal weakness.  No sore throat, cough, shortness of breath, hemoptysis or chest pain. No new abdominal pain, constipation, diarrhea, dysuria or hematuria. No new skin rash or bleeding symptoms. No new paresthesias in extremities.   PAST MEDICAL HISTORY: Reviewed. Past Medical History  Diagnosis Date  . Stress fracture of foot     left foot  . Osteoporosis with fracture   . Hypertension   . H/O hypoglycemia   . Depression     controlled  . Asthma     remote h/o asthma  . Headache     h/o migraines  . Lupus (systemic lupus erythematosus) 1995    in remission since 2005  . Arthritis     osteoarthritis  . Vitamin B 12 deficiency   . Anemia   . Nasal sinus congestion   . Hearing loss, bilateral    wears hearing aides  . History of blood transfusion     pt states she had yellow jaundice  . H/O: GI bleed           Lumbar surgery February 2014  Hospitalization mid April 2004 for progressive anemia, Hemoccult positive stool.  Colonoscopy April 2014 - internal hemorrhoids.  Diverticulosis in the entire examined colon.  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  PAST SURGICAL HISTORY: Reviewed. Past Surgical History  Procedure Laterality Date  . Anterior cervical decomp/discectomy fusion  2009  . Tonsillectomy  1945  . Partial gastrectomy  1963    and reconstruction 1973  . Orif femur fracture  2012    FAMILY HISTORY: Reviewed. Patient's father had bladder cancer at age 45.  Otherwise remarkable for hypertension, a brother has prostate cancer.  Denies hematological disorders.  SOCIAL HISTORY: Reviewed. History  Substance Use Topics  . Smoking status: Never Smoker   . Smokeless tobacco: Never Used  . Alcohol Use: No    Allergies  Allergen Reactions  . Sulfa Antibiotics Rash    Current Outpatient Prescriptions  Medication Sig Dispense Refill  . Ascorbic Acid (VITAMIN C) 1000 MG tablet Take 1,000 mg by mouth 2 (two) times daily.    Marland Kitchen aspirin 81 MG tablet Take 81 mg by mouth daily.    . Aspirin-Acetaminophen-Caffeine (EXCEDRIN MIGRAINE PO) Take 1 tablet by mouth daily as needed. For migraines    .  Calcium Carbonate (CALTRATE 600 PO) Take 1 tablet by mouth 2 (two) times daily.    . cholecalciferol (VITAMIN D) 1000 UNITS tablet Take 1,000 Units by mouth daily.    . citalopram (CELEXA) 20 MG tablet Take 20 mg by mouth daily.    Marland Kitchen lisinopril (PRINIVIL,ZESTRIL) 2.5 MG tablet Take 2.5 mg by mouth daily.    . meloxicam (MOBIC) 7.5 MG tablet Take 7.5 mg by mouth daily.    . mirtazapine (REMERON) 30 MG tablet Take 30 mg by mouth at bedtime.    . Multiple Vitamins-Minerals (PRESERVISION AREDS) TABS Take 2 tablets by mouth daily.    . travoprost, benzalkonium, (TRAVATAN) 0.004 %  ophthalmic solution Place 1 drop into both eyes at bedtime.    . cyclobenzaprine (FLEXERIL) 10 MG tablet Take 1 tablet (10 mg total) by mouth 3 (three) times daily as needed for muscle spasms. (Patient not taking: Reported on 05/04/2015) 60 tablet 1  . HYDROcodone-acetaminophen (NORCO/VICODIN) 5-325 MG per tablet Take 1 tablet by mouth every 6 (six) hours as needed. For pain    . oxyCODONE-acetaminophen (PERCOCET/ROXICET) 5-325 MG per tablet Take 1-2 tablets by mouth every 4 (four) hours as needed. (Patient not taking: Reported on 05/04/2015) 80 tablet 0   No current facility-administered medications for this visit.    PHYSICAL EXAM: Filed Vitals:   05/04/15 1442  BP: 122/77  Pulse: 81  Temp: 97.9 F (36.6 C)  Resp: 18     Body mass index is 17.87 kg/(m^2).      GENERAL: Patient is alert and oriented and in no acute distress. There is no icterus or pallor. HEENT: EOMs intact. Oral exam negative for thrush. CVS: S1S2, regular LUNGS: Bilaterally clear to auscultation, no rhonchi. ABDOMEN: Soft, nontender. No hepatosplenomegaly clinically.  EXTREMITIES: No pedal edema.   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 Patient also intolerant of oral iron  -   patient received a course of parenteral iron therapy with IV Feraheme in May 2014.  Labs today shows hemoglobin and iron studies are doing well and maintaining in the normal range.  Patient clinically feels better also, no anemia symptoms. She is currently taking oral Iron 1 tablet 3 times a week and is tolerating this low dose without side effects. She follows with GI, had EGD and colonoscopy in last April were negative for any obvious bleeding  source  Given that iron studies are doing extremely well, plan at this time is to continue current low dose oral iron and monitor. Will get Hemoglobin, ferritin, iron and TIBC at q 16 weeks, otherwise next MD followup at 48 weeks with CBC, iron studies and make further plan of management. In between visits, the patient has been advised to call in case of any progressive anemia symptoms and will be evaluated sooner.  She is agreeable to this plan.      Leia Alf, MD   05/14/2015 9:00 AM

## 2015-08-24 ENCOUNTER — Inpatient Hospital Stay: Payer: Medicare Other | Attending: Internal Medicine

## 2015-08-24 DIAGNOSIS — Z9884 Bariatric surgery status: Secondary | ICD-10-CM | POA: Diagnosis not present

## 2015-08-24 DIAGNOSIS — D509 Iron deficiency anemia, unspecified: Secondary | ICD-10-CM

## 2015-08-24 LAB — IRON AND TIBC
Iron: 146 ug/dL (ref 28–170)
SATURATION RATIOS: 52 % — AB (ref 10.4–31.8)
TIBC: 281 ug/dL (ref 250–450)
UIBC: 135 ug/dL

## 2015-08-24 LAB — CBC WITH DIFFERENTIAL/PLATELET
BASOS ABS: 0.1 10*3/uL (ref 0–0.1)
Basophils Relative: 1 %
Eosinophils Absolute: 0.1 10*3/uL (ref 0–0.7)
Eosinophils Relative: 1 %
HEMATOCRIT: 43.2 % (ref 35.0–47.0)
HEMOGLOBIN: 14.4 g/dL (ref 12.0–16.0)
LYMPHS ABS: 2.1 10*3/uL (ref 1.0–3.6)
Lymphocytes Relative: 26 %
MCH: 31.8 pg (ref 26.0–34.0)
MCHC: 33.4 g/dL (ref 32.0–36.0)
MCV: 95.2 fL (ref 80.0–100.0)
Monocytes Absolute: 0.4 10*3/uL (ref 0.2–0.9)
Monocytes Relative: 6 %
NEUTROS ABS: 5.3 10*3/uL (ref 1.4–6.5)
NEUTROS PCT: 66 %
PLATELETS: 298 10*3/uL (ref 150–440)
RBC: 4.54 MIL/uL (ref 3.80–5.20)
RDW: 13.9 % (ref 11.5–14.5)
WBC: 8 10*3/uL (ref 3.6–11.0)

## 2015-08-24 LAB — FERRITIN: Ferritin: 90 ng/mL (ref 11–307)

## 2015-09-11 ENCOUNTER — Other Ambulatory Visit: Payer: Self-pay | Admitting: Family Medicine

## 2015-09-11 DIAGNOSIS — Z1231 Encounter for screening mammogram for malignant neoplasm of breast: Secondary | ICD-10-CM

## 2015-09-12 ENCOUNTER — Ambulatory Visit
Admission: RE | Admit: 2015-09-12 | Discharge: 2015-09-12 | Disposition: A | Discharge status: Home / Self Care | Payer: Medicare Other | Source: Ambulatory Visit | Attending: Family Medicine | Admitting: Family Medicine

## 2015-09-12 ENCOUNTER — Other Ambulatory Visit: Payer: Self-pay | Admitting: Family Medicine

## 2015-09-12 DIAGNOSIS — Z1231 Encounter for screening mammogram for malignant neoplasm of breast: Secondary | ICD-10-CM | POA: Diagnosis not present

## 2015-12-10 ENCOUNTER — Other Ambulatory Visit: Payer: Self-pay | Admitting: Rheumatology

## 2015-12-10 DIAGNOSIS — M545 Low back pain: Secondary | ICD-10-CM

## 2015-12-14 ENCOUNTER — Inpatient Hospital Stay: Payer: Medicare Other | Attending: Internal Medicine

## 2015-12-14 DIAGNOSIS — D509 Iron deficiency anemia, unspecified: Secondary | ICD-10-CM | POA: Diagnosis not present

## 2015-12-14 LAB — IRON AND TIBC
IRON: 87 ug/dL (ref 28–170)
Saturation Ratios: 31 % (ref 10.4–31.8)
TIBC: 280 ug/dL (ref 250–450)
UIBC: 193 ug/dL

## 2015-12-14 LAB — CBC WITH DIFFERENTIAL/PLATELET
BASOS PCT: 1 %
Basophils Absolute: 0.1 10*3/uL (ref 0–0.1)
EOS ABS: 0.1 10*3/uL (ref 0–0.7)
Eosinophils Relative: 1 %
HEMATOCRIT: 42.7 % (ref 35.0–47.0)
Hemoglobin: 14.4 g/dL (ref 12.0–16.0)
LYMPHS ABS: 2.3 10*3/uL (ref 1.0–3.6)
Lymphocytes Relative: 27 %
MCH: 32.3 pg (ref 26.0–34.0)
MCHC: 33.8 g/dL (ref 32.0–36.0)
MCV: 95.6 fL (ref 80.0–100.0)
MONO ABS: 0.4 10*3/uL (ref 0.2–0.9)
MONOS PCT: 5 %
NEUTROS ABS: 5.7 10*3/uL (ref 1.4–6.5)
Neutrophils Relative %: 66 %
Platelets: 295 10*3/uL (ref 150–440)
RBC: 4.46 MIL/uL (ref 3.80–5.20)
RDW: 13.5 % (ref 11.5–14.5)
WBC: 8.7 10*3/uL (ref 3.6–11.0)

## 2015-12-14 LAB — FERRITIN: Ferritin: 85 ng/mL (ref 11–307)

## 2015-12-27 ENCOUNTER — Ambulatory Visit
Admission: RE | Admit: 2015-12-27 | Discharge: 2015-12-27 | Disposition: A | Discharge status: Home / Self Care | Payer: Medicare Other | Source: Ambulatory Visit | Attending: Rheumatology | Admitting: Rheumatology

## 2015-12-27 DIAGNOSIS — M4806 Spinal stenosis, lumbar region: Secondary | ICD-10-CM | POA: Insufficient documentation

## 2015-12-27 DIAGNOSIS — M545 Low back pain: Secondary | ICD-10-CM | POA: Insufficient documentation

## 2015-12-27 DIAGNOSIS — M7138 Other bursal cyst, other site: Secondary | ICD-10-CM | POA: Insufficient documentation

## 2015-12-27 DIAGNOSIS — Z9889 Other specified postprocedural states: Secondary | ICD-10-CM | POA: Diagnosis not present

## 2015-12-27 DIAGNOSIS — M4690 Unspecified inflammatory spondylopathy, site unspecified: Secondary | ICD-10-CM | POA: Insufficient documentation

## 2016-02-25 ENCOUNTER — Emergency Department
Admission: EM | Admit: 2016-02-25 | Discharge: 2016-02-25 | Disposition: A | Discharge status: Home / Self Care | Payer: Medicare Other | Attending: Emergency Medicine | Admitting: Emergency Medicine

## 2016-02-25 ENCOUNTER — Encounter: Payer: Self-pay | Admitting: Emergency Medicine

## 2016-02-25 ENCOUNTER — Emergency Department: Payer: Medicare Other

## 2016-02-25 DIAGNOSIS — Y939 Activity, unspecified: Secondary | ICD-10-CM | POA: Diagnosis not present

## 2016-02-25 DIAGNOSIS — Z79899 Other long term (current) drug therapy: Secondary | ICD-10-CM | POA: Diagnosis not present

## 2016-02-25 DIAGNOSIS — F329 Major depressive disorder, single episode, unspecified: Secondary | ICD-10-CM | POA: Insufficient documentation

## 2016-02-25 DIAGNOSIS — M1990 Unspecified osteoarthritis, unspecified site: Secondary | ICD-10-CM | POA: Diagnosis not present

## 2016-02-25 DIAGNOSIS — S72301A Unspecified fracture of shaft of right femur, initial encounter for closed fracture: Secondary | ICD-10-CM | POA: Insufficient documentation

## 2016-02-25 DIAGNOSIS — J45909 Unspecified asthma, uncomplicated: Secondary | ICD-10-CM | POA: Diagnosis not present

## 2016-02-25 DIAGNOSIS — Y999 Unspecified external cause status: Secondary | ICD-10-CM | POA: Insufficient documentation

## 2016-02-25 DIAGNOSIS — Z7982 Long term (current) use of aspirin: Secondary | ICD-10-CM | POA: Insufficient documentation

## 2016-02-25 DIAGNOSIS — X501XXA Overexertion from prolonged static or awkward postures, initial encounter: Secondary | ICD-10-CM | POA: Insufficient documentation

## 2016-02-25 DIAGNOSIS — S8291XA Unspecified fracture of right lower leg, initial encounter for closed fracture: Secondary | ICD-10-CM | POA: Diagnosis present

## 2016-02-25 DIAGNOSIS — Z791 Long term (current) use of non-steroidal anti-inflammatories (NSAID): Secondary | ICD-10-CM | POA: Diagnosis not present

## 2016-02-25 DIAGNOSIS — S7291XA Unspecified fracture of right femur, initial encounter for closed fracture: Secondary | ICD-10-CM

## 2016-02-25 DIAGNOSIS — Y9222 Religious institution as the place of occurrence of the external cause: Secondary | ICD-10-CM | POA: Diagnosis not present

## 2016-02-25 DIAGNOSIS — I10 Essential (primary) hypertension: Secondary | ICD-10-CM | POA: Insufficient documentation

## 2016-02-25 DIAGNOSIS — M4316 Spondylolisthesis, lumbar region: Secondary | ICD-10-CM | POA: Insufficient documentation

## 2016-02-25 MED ORDER — HYDROCODONE-ACETAMINOPHEN 5-325 MG PO TABS: 1.0000 | ORAL_TABLET | ORAL | Status: DC | PRN

## 2016-02-25 MED ORDER — HYDROCODONE-ACETAMINOPHEN 5-325 MG PO TABS
1.0000 | ORAL_TABLET | Freq: Once | ORAL | Status: AC
  Administered 2016-02-25: 1 via ORAL
  Filled 2016-02-25: qty 1

## 2016-02-25 NOTE — ED Provider Notes (Signed)
[facility] Emergency Department Provider Note   ____________________________________________  Time seen: Approximately 1:12 PM  I have reviewed the triage vital signs and the nursing notes.   HISTORY  Chief Complaint Leg Pain   HPI Renee Cabrera is a 75 y.o. female is here today after missing a step at church yesterday and now having pain in her leg. Patient states that she didn't actually hit the ground but more or less stumbled when she missed a step. She denies any head injury or loss of consciousness. She states that she believes that this may have been a result of her recent sciatica in her right leg. She went to physical therapy and seems to have gotten much better. She has continued to have some pain in her right leg and has been using her walker at home. She is status post hip replacement and her surgeon was Dr. Rudene Christians. She also has a history of osteoporosis. Currently she rates her pain as a 10 over 10.   Past Medical History  Diagnosis Date  . Stress fracture of foot     left foot  . Osteoporosis with fracture   . Hypertension   . H/O hypoglycemia   . Depression     controlled  . Asthma     remote h/o asthma  . Headache(784.0)     h/o migraines  . Lupus (systemic lupus erythematosus) ([place]) 1995    in remission since 2005  . Arthritis     osteoarthritis  . Vitamin B 12 deficiency   . Anemia   . Nasal sinus congestion   . Hearing loss, bilateral     wears hearing aides  . History of blood transfusion     pt states she had yellow jaundice  . H/O: GI bleed     Patient Active Problem List   Diagnosis Date Noted  . Iron deficiency anemia 05/04/2015  . 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 Anterior cervical decomp/discectomy fusion  2009  . Tonsillectomy  1945  . Partial gastrectomy  1963    and reconstruction 1973  . Orif femur fracture  2012    Current  Outpatient Rx  Name  Route  Sig  Dispense  Refill  . Ascorbic Acid (VITAMIN C) 1000 MG tablet   Oral   Take 1,000 mg by mouth 2 (two) times daily.         Marland Kitchen aspirin 81 MG tablet   Oral   Take 81 mg by mouth daily.         . Aspirin-Acetaminophen-Caffeine (EXCEDRIN MIGRAINE PO)   Oral   Take 1 tablet by mouth daily as needed. For migraines         . Calcium Carbonate (CALTRATE 600 PO)   Oral   Take 1 tablet by mouth 2 (two) times daily.         . cholecalciferol (VITAMIN D) 1000 UNITS tablet   Oral   Take 1,000 Units by mouth daily.         . citalopram (CELEXA) 20 MG tablet   Oral   Take 20 mg by mouth daily.         Marland Kitchen HYDROcodone-acetaminophen (NORCO/VICODIN) 5-325 MG tablet   Oral   Take 1 tablet by mouth every 4 (four) hours as needed for moderate pain.   20 tablet   0   . lisinopril (PRINIVIL,ZESTRIL) 2.5 MG tablet   Oral  Take 2.5 mg by mouth daily.         . meloxicam (MOBIC) 7.5 MG tablet   Oral   Take 7.5 mg by mouth daily.         . mirtazapine (REMERON) 30 MG tablet   Oral   Take 30 mg by mouth at bedtime.         . Multiple Vitamins-Minerals (PRESERVISION AREDS) TABS   Oral   Take 2 tablets by mouth daily.         . travoprost, benzalkonium, (TRAVATAN) 0.004 % ophthalmic solution   Both Eyes   Place 1 drop into both eyes at bedtime.           Allergies Sulfa antibiotics  Family History  Problem Relation Age of Onset  . Breast cancer Neg Hx     Social History Social History  Substance Use Topics  . Smoking status: Never Smoker   . Smokeless tobacco: Never Used  . Alcohol Use: No    Review of Systems Constitutional: No fever/chills Eyes: No visual changes. ENT: No trauma Cardiovascular: Denies chest pain. Respiratory: Denies shortness of breath. Gastrointestinal: No abdominal pain.  No nausea, no vomiting.  Musculoskeletal: Positive for right leg pain. Negative for hip pain. Skin: Negative for  rash. Neurological: Negative for headaches, focal weakness or numbness.  10-point ROS otherwise negative.  ____________________________________________   PHYSICAL EXAM:  VITAL SIGNS: ED Triage Vitals  Enc Vitals Group     BP 02/25/16 1239 124/46 mmHg     Pulse Rate 02/25/16 1239 78     Resp 02/25/16 1239 16     Temp 02/25/16 1239 98.4 F (36.9 C)     Temp Source 02/25/16 1239 Oral     SpO2 02/25/16 1239 98 %     Weight 02/25/16 1239 95 lb (43.092 kg)     Height 02/25/16 1239 5' (1.524 m)     Head Cir --      Peak Flow --      Pain Score 02/25/16 1239 10     Pain Loc --      Pain Edu? --      Excl. in [place]? --     Constitutional: Alert and oriented. Well appearing and in no acute distress. Eyes: Conjunctivae are normal. PERRL. EOMI. Head: Atraumatic. Nose: No congestion/rhinnorhea. Neck: No stridor. Cardiovascular: Normal rate, regular rhythm. Grossly normal heart sounds.  Good peripheral circulation. Respiratory: Normal respiratory effort.  No retractions. Lungs CTAB. Gastrointestinal: Soft and nontender. No distention.  Musculoskeletal: On examination of the right leg there is no gross deformity. There is no effusion or edema noted around the anterior right knee. Range of motion is without restriction. There is no ecchymosis or edema noted along the right thigh. There is some minimal tenderness on palpation of the distal to mid femur. No soft tissue swelling was noted. No edema or pain is noted with the right ankle or foot. Neurologic:  Normal speech and language. No gross focal neurologic deficits are appreciated. Skin:  Skin is warm, dry and intact. No erythema, ecchymosis, or abrasions were noted. Psychiatric: Mood and affect are normal. Speech and behavior are normal.  ____________________________________________   LABS (all labs ordered are listed, but only abnormal results are displayed)  Labs Reviewed - No data to display   RADIOLOGY  Pelvis x-ray per  radiologist is negative for acute pelvic fracture or dislocation. X-ray of right femur per radiologist shows nondisplaced acute right femoral midshaft fracture. Compression screw and  rod are in place. I, Johnn Hai, personally viewed and evaluated these images (plain radiographs) as part of my medical decision making, as well as reviewing the written report by the radiologist. ____________________________________________   PROCEDURES  Procedure(s) performed: None  Critical Care performed: No  ____________________________________________   INITIAL IMPRESSION / ASSESSMENT AND PLAN / ED COURSE  Pertinent labs & imaging results that were available during my care of the patient were reviewed by me and considered in my medical decision making (see chart for details).  Patient has a walker with her. She is aware of her injury and to be nonweightbearing until she is able to see her orthopedist. She is also placed in a knee immobilizer to help support and protect her thigh. Patient is to ice and elevate if needed for swelling and to help with pain. She has also taken hydrocodone in the past without any difficulty. She is still warned that this could cause drowsiness which may increase her risk of falling. She is to call Dr. Theodore Demark office today to make an appointment. ____________________________________________   FINAL CLINICAL IMPRESSION(S) / ED DIAGNOSES  Final diagnoses:  Femur fracture, right, closed, initial encounter      NEW MEDICATIONS STARTED DURING THIS VISIT:  Discharge Medication List as of 02/25/2016  2:24 PM       Note:  This document was prepared using Dragon voice recognition software and may include unintentional dictation errors.    Johnn Hai, PA-C 02/25/16 [place], MD 02/28/16 [phone]

## 2016-02-25 NOTE — ED Notes (Signed)
States she missed a step yesterday, recent siatica in right leg.  Now has pain in knee.

## 2016-02-25 NOTE — Discharge Instructions (Signed)
Use knee immobilizer for extra support. Do not weight bear on your foot and use your walker. Make an appointment with Dr. Rudene Christians to be seen this week.  Ice and elevate as needed for swelling and pain. A prescription for hydrocodone was given. Be aware that this could cause drowsiness and increase your risk for falling.

## 2016-03-31 ENCOUNTER — Other Ambulatory Visit: Payer: Self-pay | Admitting: *Deleted

## 2016-03-31 DIAGNOSIS — D509 Iron deficiency anemia, unspecified: Secondary | ICD-10-CM

## 2016-04-04 ENCOUNTER — Inpatient Hospital Stay (HOSPITAL_BASED_OUTPATIENT_CLINIC_OR_DEPARTMENT_OTHER): Payer: Medicare Other | Admitting: Oncology

## 2016-04-04 ENCOUNTER — Ambulatory Visit: Payer: Medicare Other | Admitting: Internal Medicine

## 2016-04-04 ENCOUNTER — Other Ambulatory Visit: Payer: Medicare Other

## 2016-04-04 ENCOUNTER — Inpatient Hospital Stay: Payer: Medicare Other | Attending: Internal Medicine

## 2016-04-04 VITALS — BP 116/67 | HR 75 | Temp 98.2°F | Resp 18 | Wt 95.9 lb

## 2016-04-04 DIAGNOSIS — I10 Essential (primary) hypertension: Secondary | ICD-10-CM

## 2016-04-04 DIAGNOSIS — Z79899 Other long term (current) drug therapy: Secondary | ICD-10-CM | POA: Diagnosis not present

## 2016-04-04 DIAGNOSIS — Z7982 Long term (current) use of aspirin: Secondary | ICD-10-CM | POA: Insufficient documentation

## 2016-04-04 DIAGNOSIS — M1990 Unspecified osteoarthritis, unspecified site: Secondary | ICD-10-CM | POA: Diagnosis not present

## 2016-04-04 DIAGNOSIS — D509 Iron deficiency anemia, unspecified: Secondary | ICD-10-CM | POA: Diagnosis not present

## 2016-04-04 DIAGNOSIS — Z8781 Personal history of (healed) traumatic fracture: Secondary | ICD-10-CM | POA: Insufficient documentation

## 2016-04-04 DIAGNOSIS — M79604 Pain in right leg: Secondary | ICD-10-CM

## 2016-04-04 LAB — IRON AND TIBC
IRON: 75 ug/dL (ref 28–170)
Saturation Ratios: 26 % (ref 10.4–31.8)
TIBC: 285 ug/dL (ref 250–450)
UIBC: 210 ug/dL

## 2016-04-04 LAB — CBC WITH DIFFERENTIAL/PLATELET
BASOS PCT: 1 %
Basophils Absolute: 0.1 10*3/uL (ref 0–0.1)
EOS ABS: 0.1 10*3/uL (ref 0–0.7)
EOS PCT: 1 %
HEMATOCRIT: 41.6 % (ref 35.0–47.0)
Hemoglobin: 14.1 g/dL (ref 12.0–16.0)
Lymphocytes Relative: 26 %
Lymphs Abs: 2.3 10*3/uL (ref 1.0–3.6)
MCH: 32.4 pg (ref 26.0–34.0)
MCHC: 33.9 g/dL (ref 32.0–36.0)
MCV: 95.5 fL (ref 80.0–100.0)
MONO ABS: 0.5 10*3/uL (ref 0.2–0.9)
MONOS PCT: 6 %
Neutro Abs: 5.7 10*3/uL (ref 1.4–6.5)
Neutrophils Relative %: 66 %
Platelets: 320 10*3/uL (ref 150–440)
RBC: 4.35 MIL/uL (ref 3.80–5.20)
RDW: 13.4 % (ref 11.5–14.5)
WBC: 8.7 10*3/uL (ref 3.6–11.0)

## 2016-04-04 LAB — FERRITIN: FERRITIN: 96 ng/mL (ref 11–307)

## 2016-04-04 NOTE — Progress Notes (Signed)
[place]  Telephone:([phone]) [phone]  Fax:([phone]     Renee Cabrera DOB: [birth year]  MR#: [id number]  [id number]  Patient Care Team: [name], MD as PCP - General (Family Medicine)  CHIEF COMPLAINT:  Chief Complaint  Patient presents with  . IDA    INTERVAL HISTORY: Patient returns to clinic today for lab results and evaluation for iron deficiency anemia. She was last seen one year ago. Her last IV iron was in 2014. She recently broke her right leg and has pain but otherwise feels well and is asymptomatic. She takes oral iron daily and tolerates it well without side effects. She denies chest pain, palpitations or shortness of breath.  She denies fatigue or weakness. She denies constipation or diarrhea. She is without complaints today.    REVIEW OF SYSTEMS:   Review of Systems  Constitutional: Negative.   HENT: Negative.   Eyes: Negative.   Respiratory: Negative.   Cardiovascular: Negative.   Gastrointestinal: Negative.   Genitourinary: Negative.   Musculoskeletal:       Right leg pain  Skin: Negative.   Neurological: Negative.   Endo/Heme/Allergies: Negative.   Psychiatric/Behavioral: Negative.     As per HPI. Otherwise, a complete review of systems is negatve.  ONCOLOGY HISTORY:  No history exists.    PAST MEDICAL HISTORY: Past Medical History  Diagnosis Date  . Stress fracture of foot     left foot  . Osteoporosis with fracture   . Hypertension   . H/O hypoglycemia   . Depression     controlled  . Asthma     remote h/o asthma  . Headache(784.0)     h/o migraines  . Lupus (systemic lupus erythematosus) ([place]) 1995    in remission since 2005  . Arthritis     osteoarthritis  . Vitamin B 12 deficiency   . Anemia   . Nasal sinus congestion   . Hearing loss, bilateral     wears hearing aides  . History of blood transfusion     pt states she had yellow jaundice  . H/O: GI bleed     PAST SURGICAL HISTORY: Past  Surgical History  Procedure Laterality Date  . Anterior cervical decomp/discectomy fusion  2009  . Tonsillectomy  1945  . Partial gastrectomy  1963    and reconstruction 1973  . Orif femur fracture  2012    FAMILY HISTORY Family History  Problem Relation Age of Onset  . Breast cancer Neg Hx     GYNECOLOGIC HISTORY:  No LMP recorded. Patient is postmenopausal.     ADVANCED DIRECTIVES:    HEALTH MAINTENANCE: Social History  Substance Use Topics  . Smoking status: Never Smoker   . Smokeless tobacco: Never Used  . Alcohol Use: No    Allergies  Allergen Reactions  . Sulfa Antibiotics Rash    Current Outpatient Prescriptions  Medication Sig Dispense Refill  . albuterol (PROAIR HFA) 108 (90 Base) MCG/ACT inhaler Inhale into the lungs.    . Ascorbic Acid (VITAMIN C) 1000 MG tablet Take 1,000 mg by mouth 2 (two) times daily.    Marland Kitchen aspirin 81 MG tablet Take 81 mg by mouth daily.    . Aspirin-Acetaminophen-Caffeine (EXCEDRIN MIGRAINE PO) Take 1 tablet by mouth daily as needed. For migraines    . Calcium Carbonate (CALTRATE 600 PO) Take 1 tablet by mouth 2 (two) times daily.    . cholecalciferol (VITAMIN D) 1000 UNITS tablet Take 1,000 Units by mouth daily.    Marland Kitchen  citalopram (CELEXA) 20 MG tablet Take 20 mg by mouth daily.    . cyanocobalamin (,VITAMIN B-12,) 1000 MCG/ML injection Inject into the muscle.    . denosumab (PROLIA) 60 MG/ML SOLN injection Inject into the skin.    . Fe Fum-FA-B Cmp-C-Zn-Mg-Mn-Cu (HEMATINIC PLUS VIT/MINERALS) 106-1 MG TABS Take 1 tablet by mouth daily.  3  . gabapentin (NEURONTIN) 100 MG capsule     . HYDROcodone-acetaminophen (NORCO/VICODIN) 5-325 MG tablet Take 1 tablet by mouth every 4 (four) hours as needed for moderate pain. 20 tablet 0  . lisinopril (PRINIVIL,ZESTRIL) 2.5 MG tablet Take 2.5 mg by mouth daily.    . meloxicam (MOBIC) 7.5 MG tablet Take 7.5 mg by mouth daily.    . mirtazapine (REMERON) 30 MG tablet Take 30 mg by mouth at bedtime.      . Multiple Vitamins-Minerals (PRESERVISION AREDS) TABS Take 2 tablets by mouth daily.    . pantoprazole (PROTONIX) 40 MG tablet TAKE 1 TABLET BY MOUTH DAILY 1 HOUR BEFORE MEALS  1  . travoprost, benzalkonium, (TRAVATAN) 0.004 % ophthalmic solution Place 1 drop into both eyes at bedtime.    Marland Kitchen zolpidem (AMBIEN) 5 MG tablet      No current facility-administered medications for this visit.    OBJECTIVE: BP 116/67 mmHg  Pulse 75  Temp(Src) 98.2 F (36.8 C) (Tympanic)  Resp 18  Wt 95 lb 14.4 oz (43.5 kg)   Body mass index is 18.73 kg/(m^2).    ECOG FS:0 - Asymptomatic  General: Well-developed, well-nourished, no acute distress. Lungs: Clear to auscultation bilaterally. Heart: Regular rate and rhythm.  Abdomen: Soft, nontender, nondistended. Musculoskeletal: Right leg pain Neuro: Alert, answering all questions appropriately. Skin: No rashes or petechiae noted. Psych: Normal affect.   LAB RESULTS:  Appointment on 04/04/2016  Component Date Value Ref Range Status  . WBC 04/04/2016 8.7  3.6 - 11.0 K/uL Final  . RBC 04/04/2016 4.35  3.80 - 5.20 MIL/uL Final  . Hemoglobin 04/04/2016 14.1  12.0 - 16.0 g/dL Final  . HCT 04/04/2016 41.6  35.0 - 47.0 % Final  . MCV 04/04/2016 95.5  80.0 - 100.0 fL Final  . MCH 04/04/2016 32.4  26.0 - 34.0 pg Final  . MCHC 04/04/2016 33.9  32.0 - 36.0 g/dL Final  . RDW 04/04/2016 13.4  11.5 - 14.5 % Final  . Platelets 04/04/2016 320  150 - 440 K/uL Final  . Neutrophils Relative % 04/04/2016 66   Final  . Neutro Abs 04/04/2016 5.7  1.4 - 6.5 K/uL Final  . Lymphocytes Relative 04/04/2016 26   Final  . Lymphs Abs 04/04/2016 2.3  1.0 - 3.6 K/uL Final  . Monocytes Relative 04/04/2016 6   Final  . Monocytes Absolute 04/04/2016 0.5  0.2 - 0.9 K/uL Final  . Eosinophils Relative 04/04/2016 1   Final  . Eosinophils Absolute 04/04/2016 0.1  0 - 0.7 K/uL Final  . Basophils Relative 04/04/2016 1   Final  . Basophils Absolute 04/04/2016 0.1  0 - 0.1 K/uL Final     STUDIES: No results found.  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   PLAN:    1. Iron deficiency Anemia: Patient takes oral iron daily. Her HGB is 14.1 today. Iron studies are still pending for today. Patient is asymptomatic. No IV iron needed today. Her last IV iron infusion was in 2014. Patient requests to have labs checked in 6 months and then see MD  in one year. She may be discharged from clinic at that time to her pcp but she is not comfortable with that today.   Patient expressed understanding and was in agreement with this plan. She also understands that She can call clinic at any time with any questions, concerns, or complaints.   Dr. Rogue Bussing was available for consultation and review of plan of care for this patient.   Mayra Reel, NP   04/04/2016 2:58 PM

## 2016-07-01 ENCOUNTER — Encounter
Admission: RE | Disposition: A | Discharge status: Skilled Nursing Facility | Payer: Self-pay | Source: Ambulatory Visit | Attending: Orthopedic Surgery

## 2016-07-01 ENCOUNTER — Inpatient Hospital Stay: Payer: Medicare Other | Admitting: Anesthesiology

## 2016-07-01 ENCOUNTER — Inpatient Hospital Stay: Payer: Medicare Other

## 2016-07-01 ENCOUNTER — Encounter: Payer: Self-pay | Admitting: *Deleted

## 2016-07-01 ENCOUNTER — Inpatient Hospital Stay
Admission: RE | Admit: 2016-07-01 | Discharge: 2016-07-04 | DRG: 481 | Disposition: A | Discharge status: Skilled Nursing Facility | Payer: Medicare Other | Source: Ambulatory Visit | Attending: Orthopedic Surgery | Admitting: Orthopedic Surgery

## 2016-07-01 DIAGNOSIS — Z79899 Other long term (current) drug therapy: Secondary | ICD-10-CM

## 2016-07-01 DIAGNOSIS — Z419 Encounter for procedure for purposes other than remedying health state, unspecified: Secondary | ICD-10-CM

## 2016-07-01 DIAGNOSIS — I951 Orthostatic hypotension: Secondary | ICD-10-CM | POA: Diagnosis present

## 2016-07-01 DIAGNOSIS — I10 Essential (primary) hypertension: Secondary | ICD-10-CM | POA: Diagnosis present

## 2016-07-01 DIAGNOSIS — J45909 Unspecified asthma, uncomplicated: Secondary | ICD-10-CM | POA: Diagnosis present

## 2016-07-01 DIAGNOSIS — Z7983 Long term (current) use of bisphosphonates: Secondary | ICD-10-CM

## 2016-07-01 DIAGNOSIS — Z791 Long term (current) use of non-steroidal anti-inflammatories (NSAID): Secondary | ICD-10-CM | POA: Diagnosis not present

## 2016-07-01 DIAGNOSIS — D649 Anemia, unspecified: Secondary | ICD-10-CM | POA: Diagnosis present

## 2016-07-01 DIAGNOSIS — Z7982 Long term (current) use of aspirin: Secondary | ICD-10-CM

## 2016-07-01 DIAGNOSIS — D62 Acute posthemorrhagic anemia: Secondary | ICD-10-CM | POA: Diagnosis not present

## 2016-07-01 DIAGNOSIS — H9193 Unspecified hearing loss, bilateral: Secondary | ICD-10-CM | POA: Diagnosis present

## 2016-07-01 DIAGNOSIS — M80051A Age-related osteoporosis with current pathological fracture, right femur, initial encounter for fracture: Secondary | ICD-10-CM | POA: Diagnosis present

## 2016-07-01 DIAGNOSIS — M80051K Age-related osteoporosis with current pathological fracture, right femur, subsequent encounter for fracture with nonunion: Secondary | ICD-10-CM | POA: Diagnosis present

## 2016-07-01 HISTORY — PX: INTRAMEDULLARY (IM) NAIL INTERTROCHANTERIC: SHX5875

## 2016-07-01 LAB — BASIC METABOLIC PANEL
ANION GAP: 7 (ref 5–15)
BUN: 15 mg/dL (ref 6–20)
CALCIUM: 9.3 mg/dL (ref 8.9–10.3)
CO2: 28 mmol/L (ref 22–32)
Chloride: 105 mmol/L (ref 101–111)
Creatinine, Ser: 0.74 mg/dL (ref 0.44–1.00)
GFR calc Af Amer: >60 mL/min (ref >60)
GLUCOSE: 100 mg/dL — AB (ref 65–99)
POTASSIUM: 3.9 mmol/L (ref 3.5–5.1)
SODIUM: 140 mmol/L (ref 135–145)

## 2016-07-01 LAB — APTT: aPTT: 35 seconds (ref 24–36)

## 2016-07-01 LAB — PROTIME-INR
INR: 0.92
Prothrombin Time: 12.3 seconds (ref 11.4–15.2)

## 2016-07-01 LAB — CBC
HEMATOCRIT: 42.9 % (ref 35.0–47.0)
Hemoglobin: 14.8 g/dL (ref 12.0–16.0)
MCH: 32.7 pg (ref 26.0–34.0)
MCHC: 34.6 g/dL (ref 32.0–36.0)
MCV: 94.5 fL (ref 80.0–100.0)
PLATELETS: 293 10*3/uL (ref 150–440)
RBC: 4.53 MIL/uL (ref 3.80–5.20)
RDW: 13.4 % (ref 11.5–14.5)
WBC: 8.1 10*3/uL (ref 3.6–11.0)

## 2016-07-01 LAB — URINALYSIS COMPLETE WITH MICROSCOPIC (ARMC ONLY)
BILIRUBIN URINE: NEGATIVE
Bacteria, UA: NONE SEEN
Glucose, UA: NEGATIVE mg/dL
KETONES UR: NEGATIVE mg/dL
Nitrite: NEGATIVE
PH: 5 (ref 5.0–8.0)
PROTEIN: NEGATIVE mg/dL
SPECIFIC GRAVITY, URINE: 1.011 (ref 1.005–1.030)
Squamous Epithelial / LPF: NONE SEEN

## 2016-07-01 LAB — SURGICAL PCR SCREEN
MRSA, PCR: NEGATIVE
Staphylococcus aureus: NEGATIVE

## 2016-07-01 LAB — HEMOGLOBIN: Hemoglobin: 11.5 g/dL — ABNORMAL LOW (ref 12.0–16.0)

## 2016-07-01 LAB — SEDIMENTATION RATE: Sed Rate: 5 mm/hr (ref 0–30)

## 2016-07-01 SURGERY — FIXATION, FRACTURE, INTERTROCHANTERIC, WITH INTRAMEDULLARY ROD
Anesthesia: General | Laterality: Right | Wound class: Clean

## 2016-07-01 MED ORDER — OCUVITE-LUTEIN PO CAPS
2.0000 | ORAL_CAPSULE | Freq: Every day | ORAL | Status: DC
  Administered 2016-07-02 – 2016-07-04 (×3): 2 via ORAL
  Filled 2016-07-01 (×3): qty 2

## 2016-07-01 MED ORDER — NEOMYCIN-POLYMYXIN B GU 40-200000 IR SOLN
Status: DC | PRN
  Administered 2016-07-01: 2 mL

## 2016-07-01 MED ORDER — DOCUSATE SODIUM 100 MG PO CAPS
100.0000 mg | ORAL_CAPSULE | Freq: Two times a day (BID) | ORAL | Status: DC
  Administered 2016-07-01 – 2016-07-04 (×7): 100 mg via ORAL
  Filled 2016-07-01 (×7): qty 1

## 2016-07-01 MED ORDER — NEOMYCIN-POLYMYXIN B GU 40-200000 IR SOLN
Status: AC
  Filled 2016-07-01: qty 2

## 2016-07-01 MED ORDER — GABAPENTIN 100 MG PO CAPS
100.0000 mg | ORAL_CAPSULE | Freq: Every day | ORAL | Status: DC
  Administered 2016-07-01 – 2016-07-03 (×3): 100 mg via ORAL
  Filled 2016-07-01 (×3): qty 1

## 2016-07-01 MED ORDER — DENOSUMAB 60 MG/ML ~~LOC~~ SOLN
60.0000 mg | Freq: Once | SUBCUTANEOUS | Status: DC
Start: 2016-07-02 — End: 2016-07-01

## 2016-07-01 MED ORDER — ACETAMINOPHEN 325 MG PO TABS: 650.0000 mg | ORAL_TABLET | Freq: Four times a day (QID) | ORAL | Status: DC | PRN

## 2016-07-01 MED ORDER — FENTANYL CITRATE (PF) 100 MCG/2ML IJ SOLN
25.0000 ug | INTRAMUSCULAR | Status: DC | PRN
  Administered 2016-07-01 (×4): 25 ug via INTRAVENOUS

## 2016-07-01 MED ORDER — PANTOPRAZOLE SODIUM 40 MG PO TBEC
40.0000 mg | DELAYED_RELEASE_TABLET | Freq: Two times a day (BID) | ORAL | Status: DC
  Administered 2016-07-01 – 2016-07-04 (×6): 40 mg via ORAL
  Filled 2016-07-01 (×8): qty 1

## 2016-07-01 MED ORDER — SODIUM CHLORIDE 0.9 % IV BOLUS (SEPSIS)
250.0000 mL | Freq: Once | INTRAVENOUS | Status: AC
  Administered 2016-07-01: 250 mL via INTRAVENOUS

## 2016-07-01 MED ORDER — ADULT MULTIVITAMIN W/MINERALS CH
1.0000 | ORAL_TABLET | Freq: Every day | ORAL | Status: DC
  Administered 2016-07-02 – 2016-07-04 (×3): 1 via ORAL
  Filled 2016-07-01 (×3): qty 1

## 2016-07-01 MED ORDER — LIDOCAINE HCL (CARDIAC) 20 MG/ML IV SOLN
INTRAVENOUS | Status: DC | PRN
  Administered 2016-07-01: 60 mg via INTRAVENOUS

## 2016-07-01 MED ORDER — ENOXAPARIN SODIUM 40 MG/0.4ML ~~LOC~~ SOLN
40.0000 mg | SUBCUTANEOUS | Status: DC
  Administered 2016-07-02 – 2016-07-03 (×2): 40 mg via SUBCUTANEOUS
  Filled 2016-07-01 (×3): qty 0.4

## 2016-07-01 MED ORDER — DEXAMETHASONE SODIUM PHOSPHATE 10 MG/ML IJ SOLN
INTRAMUSCULAR | Status: DC | PRN
  Administered 2016-07-01: 10 mg via INTRAVENOUS

## 2016-07-01 MED ORDER — FAMOTIDINE 20 MG PO TABS
ORAL_TABLET | ORAL | Status: AC
  Filled 2016-07-01: qty 1

## 2016-07-01 MED ORDER — MORPHINE SULFATE (PF) 2 MG/ML IV SOLN: 2.0000 mg | INTRAVENOUS | Status: DC | PRN

## 2016-07-01 MED ORDER — PHENYLEPHRINE HCL 10 MG/ML IJ SOLN
INTRAMUSCULAR | Status: DC | PRN
  Administered 2016-07-01 (×5): 100 ug via INTRAVENOUS

## 2016-07-01 MED ORDER — ONDANSETRON HCL 4 MG/2ML IJ SOLN: 4.0000 mg | Freq: Once | INTRAMUSCULAR | Status: DC | PRN

## 2016-07-01 MED ORDER — CALCIUM CARBONATE ANTACID 500 MG PO CHEW
600.0000 mg | CHEWABLE_TABLET | Freq: Two times a day (BID) | ORAL | Status: DC
  Administered 2016-07-01 – 2016-07-04 (×7): 600 mg via ORAL
  Filled 2016-07-01 (×7): qty 3

## 2016-07-01 MED ORDER — MIRTAZAPINE 15 MG PO TABS
30.0000 mg | ORAL_TABLET | Freq: Every day | ORAL | Status: DC
  Administered 2016-07-01 – 2016-07-03 (×3): 30 mg via ORAL
  Filled 2016-07-01 (×3): qty 2

## 2016-07-01 MED ORDER — VITAMIN D 1000 UNITS PO TABS
1000.0000 [IU] | ORAL_TABLET | Freq: Every day | ORAL | Status: DC
  Administered 2016-07-02 – 2016-07-04 (×3): 1000 [IU] via ORAL
  Filled 2016-07-01 (×3): qty 1

## 2016-07-01 MED ORDER — ASPIRIN EC 81 MG PO TBEC
81.0000 mg | DELAYED_RELEASE_TABLET | Freq: Every day | ORAL | Status: DC
  Administered 2016-07-01 – 2016-07-04 (×4): 81 mg via ORAL
  Filled 2016-07-01 (×4): qty 1

## 2016-07-01 MED ORDER — ROCURONIUM BROMIDE 100 MG/10ML IV SOLN
INTRAVENOUS | Status: DC | PRN
  Administered 2016-07-01: 10 mg via INTRAVENOUS
  Administered 2016-07-01: 35 mg via INTRAVENOUS
  Administered 2016-07-01: 5 mg via INTRAVENOUS

## 2016-07-01 MED ORDER — CITALOPRAM HYDROBROMIDE 20 MG PO TABS
20.0000 mg | ORAL_TABLET | Freq: Every day | ORAL | Status: DC
  Administered 2016-07-01 – 2016-07-04 (×4): 20 mg via ORAL
  Filled 2016-07-01 (×4): qty 1

## 2016-07-01 MED ORDER — FAMOTIDINE 20 MG PO TABS
20.0000 mg | ORAL_TABLET | Freq: Once | ORAL | Status: AC
  Administered 2016-07-01: 20 mg via ORAL

## 2016-07-01 MED ORDER — ALBUTEROL SULFATE (2.5 MG/3ML) 0.083% IN NEBU: 2.5000 mg | INHALATION_SOLUTION | Freq: Four times a day (QID) | RESPIRATORY_TRACT | Status: DC | PRN

## 2016-07-01 MED ORDER — FENTANYL CITRATE (PF) 100 MCG/2ML IJ SOLN
INTRAMUSCULAR | Status: AC
  Administered 2016-07-01: 25 ug via INTRAVENOUS
  Filled 2016-07-01: qty 2

## 2016-07-01 MED ORDER — ONDANSETRON HCL 4 MG/2ML IJ SOLN
INTRAMUSCULAR | Status: DC | PRN
  Administered 2016-07-01: 4 mg via INTRAVENOUS

## 2016-07-01 MED ORDER — HEMATINIC PLUS VIT/MINERALS 106-1 MG PO TABS: 1.0000 | ORAL_TABLET | Freq: Every day | ORAL | Status: DC

## 2016-07-01 MED ORDER — CEFAZOLIN IN D5W 1 GM/50ML IV SOLN
1.0000 g | Freq: Four times a day (QID) | INTRAVENOUS | Status: AC
  Administered 2016-07-01 – 2016-07-02 (×3): 1 g via INTRAVENOUS
  Filled 2016-07-01 (×3): qty 50

## 2016-07-01 MED ORDER — CEFAZOLIN IN D5W 1 GM/50ML IV SOLN
INTRAVENOUS | Status: AC
  Filled 2016-07-01: qty 50

## 2016-07-01 MED ORDER — CEFAZOLIN SODIUM 1 G IJ SOLR
1000.0000 mg | Freq: Once | INTRAMUSCULAR | Status: AC
  Administered 2016-07-01: 1000 mg via INTRAVENOUS
  Filled 2016-07-01: qty 10

## 2016-07-01 MED ORDER — METOCLOPRAMIDE HCL 10 MG PO TABS: 5.0000 mg | ORAL_TABLET | Freq: Three times a day (TID) | ORAL | Status: DC | PRN

## 2016-07-01 MED ORDER — VITAMIN C 500 MG PO TABS
1000.0000 mg | ORAL_TABLET | Freq: Two times a day (BID) | ORAL | Status: DC
Start: 2016-07-01 — End: 2016-07-04
  Administered 2016-07-01 – 2016-07-04 (×6): 1000 mg via ORAL
  Filled 2016-07-01 (×6): qty 2

## 2016-07-01 MED ORDER — PROPOFOL 10 MG/ML IV BOLUS
INTRAVENOUS | Status: DC | PRN
  Administered 2016-07-01: 80 mg via INTRAVENOUS

## 2016-07-01 MED ORDER — PHENOL 1.4 % MT LIQD
1.0000 | OROMUCOSAL | Status: DC | PRN
  Filled 2016-07-01: qty 177

## 2016-07-01 MED ORDER — LISINOPRIL 5 MG PO TABS
2.5000 mg | ORAL_TABLET | Freq: Every day | ORAL | Status: DC
  Administered 2016-07-03 – 2016-07-04 (×2): 2.5 mg via ORAL
  Filled 2016-07-01 (×2): qty 1

## 2016-07-01 MED ORDER — MAGNESIUM HYDROXIDE 400 MG/5ML PO SUSP
30.0000 mL | Freq: Every day | ORAL | Status: DC | PRN
  Administered 2016-07-03: 30 mL via ORAL
  Filled 2016-07-01: qty 30

## 2016-07-01 MED ORDER — ONDANSETRON HCL 4 MG PO TABS: 4.0000 mg | ORAL_TABLET | Freq: Four times a day (QID) | ORAL | Status: DC | PRN

## 2016-07-01 MED ORDER — SODIUM CHLORIDE 0.9 % IV SOLN
INTRAVENOUS | Status: DC
  Administered 2016-07-01 – 2016-07-03 (×4): via INTRAVENOUS

## 2016-07-01 MED ORDER — METOCLOPRAMIDE HCL 5 MG/ML IJ SOLN: 5.0000 mg | Freq: Three times a day (TID) | INTRAMUSCULAR | Status: DC | PRN

## 2016-07-01 MED ORDER — MELOXICAM 7.5 MG PO TABS
7.5000 mg | ORAL_TABLET | Freq: Every day | ORAL | Status: DC
  Administered 2016-07-01 – 2016-07-04 (×4): 7.5 mg via ORAL
  Filled 2016-07-01 (×5): qty 1

## 2016-07-01 MED ORDER — HYDROCODONE-ACETAMINOPHEN 5-325 MG PO TABS
1.0000 | ORAL_TABLET | ORAL | Status: DC | PRN
  Administered 2016-07-01 – 2016-07-04 (×9): 1 via ORAL
  Filled 2016-07-01 (×9): qty 1

## 2016-07-01 MED ORDER — MENTHOL 3 MG MT LOZG
1.0000 | LOZENGE | OROMUCOSAL | Status: DC | PRN
  Filled 2016-07-01: qty 9

## 2016-07-01 MED ORDER — ACETAMINOPHEN 650 MG RE SUPP: 650.0000 mg | Freq: Four times a day (QID) | RECTAL | Status: DC | PRN

## 2016-07-01 MED ORDER — SUGAMMADEX SODIUM 200 MG/2ML IV SOLN
INTRAVENOUS | Status: DC | PRN
  Administered 2016-07-01: 87 mg via INTRAVENOUS

## 2016-07-01 MED ORDER — ZOLPIDEM TARTRATE 5 MG PO TABS: 5.0000 mg | ORAL_TABLET | Freq: Every evening | ORAL | Status: DC | PRN

## 2016-07-01 MED ORDER — ASPIRIN-ACETAMINOPHEN-CAFFEINE 250-250-65 MG PO TABS: 1.0000 | ORAL_TABLET | Freq: Four times a day (QID) | ORAL | Status: DC | PRN

## 2016-07-01 MED ORDER — BISACODYL 10 MG RE SUPP
10.0000 mg | Freq: Every day | RECTAL | Status: DC | PRN
  Administered 2016-07-04: 10 mg via RECTAL
  Filled 2016-07-01: qty 1

## 2016-07-01 MED ORDER — TRAVOPROST 0.004 % OP SOLN: 1.0000 [drp] | Freq: Every day | OPHTHALMIC | Status: DC

## 2016-07-01 MED ORDER — MAGNESIUM CITRATE PO SOLN
1.0000 | Freq: Once | ORAL | Status: DC | PRN
Start: 2016-07-01 — End: 2016-07-04
  Filled 2016-07-01: qty 296

## 2016-07-01 MED ORDER — LACTATED RINGERS IV SOLN
INTRAVENOUS | Status: DC
  Administered 2016-07-01 (×2): via INTRAVENOUS

## 2016-07-01 MED ORDER — SUCCINYLCHOLINE CHLORIDE 20 MG/ML IJ SOLN
INTRAMUSCULAR | Status: DC | PRN
  Administered 2016-07-01: 80 mg via INTRAVENOUS

## 2016-07-01 MED ORDER — FENTANYL CITRATE (PF) 100 MCG/2ML IJ SOLN
INTRAMUSCULAR | Status: DC | PRN
  Administered 2016-07-01 (×3): 50 ug via INTRAVENOUS

## 2016-07-01 MED ORDER — ALUM & MAG HYDROXIDE-SIMETH 200-200-20 MG/5ML PO SUSP: 30.0000 mL | ORAL | Status: DC | PRN

## 2016-07-01 MED ORDER — ONDANSETRON HCL 4 MG/2ML IJ SOLN
4.0000 mg | Freq: Four times a day (QID) | INTRAMUSCULAR | Status: DC | PRN
Start: 2016-07-01 — End: 2016-07-04

## 2016-07-01 MED ORDER — LATANOPROST 0.005 % OP SOLN
1.0000 [drp] | Freq: Every day | OPHTHALMIC | Status: DC
  Administered 2016-07-02 – 2016-07-03 (×2): 1 [drp] via OPHTHALMIC
  Filled 2016-07-01 (×2): qty 2.5

## 2016-07-01 SURGICAL SUPPLY — 37 items
BIT DRILL 4.3MMS DISTAL GRDTED (BIT) ×1 IMPLANT
CANISTER SUCT 1200ML W/VALVE (MISCELLANEOUS) ×3 IMPLANT
CHLORAPREP W/TINT 26ML (MISCELLANEOUS) ×3 IMPLANT
COVER LIGHT HANDLE STERIS (MISCELLANEOUS) ×3 IMPLANT
DRAPE SHEET LG 3/4 BI-LAMINATE (DRAPES) ×6 IMPLANT
DRAPE SURG 17X11 SM STRL (DRAPES) ×3 IMPLANT
DRAPE U-SHAPE 47X51 STRL (DRAPES) ×3 IMPLANT
DRILL 4.3MMS DISTAL GRADUATED (BIT) ×3
DRSG OPSITE POSTOP 3X4 (GAUZE/BANDAGES/DRESSINGS) ×9 IMPLANT
DRSG OPSITE POSTOP 4X6 (GAUZE/BANDAGES/DRESSINGS) IMPLANT
ELECT REM PT RETURN 9FT ADLT (ELECTROSURGICAL) ×3
ELECTRODE REM PT RTRN 9FT ADLT (ELECTROSURGICAL) ×1 IMPLANT
GAUZE SPONGE 4X4 12PLY STRL (GAUZE/BANDAGES/DRESSINGS) ×3 IMPLANT
GLOVE BIOGEL PI IND STRL 9 (GLOVE) ×1 IMPLANT
GLOVE BIOGEL PI INDICATOR 9 (GLOVE) ×2
GLOVE SURG ORTHO 9.0 STRL STRW (GLOVE) ×3 IMPLANT
GOWN SRG 2XL LVL 4 RGLN SLV (GOWNS) ×1 IMPLANT
GOWN STRL NON-REIN 2XL LVL4 (GOWNS) ×2
GOWN STRL REUS W/ TWL LRG LVL3 (GOWN DISPOSABLE) ×1 IMPLANT
GOWN STRL REUS W/TWL LRG LVL3 (GOWN DISPOSABLE) ×2
GUIDEPIN VERSANAIL DSP 3.2X444 ×3 IMPLANT
GUIDEWIRE BALL NOSE 80CM (WIRE) ×3 IMPLANT
HFN LAG SCREW 10.5MM X 85MM (Screw) ×3 IMPLANT
IV NS 500ML (IV SOLUTION) ×2
IV NS 500ML BAXH (IV SOLUTION) ×1 IMPLANT
KIT RM TURNOVER STRD PROC AR (KITS) ×3 IMPLANT
MAT BLUE FLOOR 46X72 FLO (MISCELLANEOUS) ×3 IMPLANT
NAIL AFFIXUS RT 11X340MM (Nail) ×3 IMPLANT
NEEDLE FILTER BLUNT 18X 1/2SAF (NEEDLE) ×2
NEEDLE FILTER BLUNT 18X1 1/2 (NEEDLE) ×1 IMPLANT
PACK HIP COMPR (MISCELLANEOUS) ×3 IMPLANT
SCREW BONE CORTICAL 5.0X38 (Screw) ×3 IMPLANT
STAPLER SKIN PROX 35W (STAPLE) ×3 IMPLANT
SUT VIC AB 1 CT1 36 (SUTURE) ×3 IMPLANT
SUT VIC AB 2-0 CT1 (SUTURE) ×3 IMPLANT
SYRINGE 10CC LL (SYRINGE) ×3 IMPLANT
TAPE MICROFOAM 4IN (TAPE) IMPLANT

## 2016-07-01 NOTE — Progress Notes (Signed)
NT notified this RN of patient's BP 67/40. This RN went into room to re-check BP was 71/48. Patient is A&O x4, pain is 4/10.  IV fluids running at 75cc/hr. Notified MD. New orders received for 250cc NS bolus, increase IV rate to 100cc/hr after bolus, and check a HGB. Orders placed and bolus started.

## 2016-07-01 NOTE — H&P (Signed)
Reviewed paper H+P, will be scanned into chart. No changes noted.  

## 2016-07-01 NOTE — Op Note (Signed)
07/01/2016  12:41 PM  PATIENT:  Renee Cabrera  75 y.o. female  PRE-OPERATIVE DIAGNOSIS:  closed nondisplaced  transverse FRACTURE of shaft right femur  POST-OPERATIVE DIAGNOSIS:  closed nondisplaced  transverse FRACTURE of shaft right femur  PROCEDURE:  Procedure(s): INTRAMEDULLARY (IM) NAIL EXCHANGE OF FEMORAL ROD (Right)  SURGEON: Laurene Footman, MD  ASSISTANTS: None  ANESTHESIA:   general  EBL:  Total I/O In: 800 [I.V.:800] Out: 500 [Urine:200; Blood:300]  BLOOD ADMINISTERED:none  DRAINS: none   LOCAL MEDICATIONS USED:  NONE  SPECIMEN:  No Specimen  DISPOSITION OF SPECIMEN:  None  COUNTS:  YES  TOURNIQUET:  * No tourniquets in log *  IMPLANTS: 11 x 3 40 Biomet affixes rod with 85 mm lag screw and 38 mm distal interlocking screw  DICTATION: .Dragon Dictation patient brought the operating room and after adequate general anesthesia was obtained, the patient placed on the operative table fracture table being used. C-arm was brought in and good visualization of the right hip was obtained with the left leg in the well-leg holder. After patient identification and timeout procedure after Barrier drape method the 2 proximal incisions were opened and the screw set to loosen the screw for the lag screw and the lag screw was removed without difficulty over guidewire. There is a great of difficulty getting the wire the rod out of the femur with difficulty getting the threads on the extraction device but eventually this was done and the rod removed with the aid of vice grip. 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 The rod was inserted to the same depth as the prior rod and a 85 mm lag screw was inserted and tightened as a fixed angle device proximally. She said permanent C-arm views obtained. Going distally a single  distal interlocking screw was placed through the slotted hole using standard technique of making a small incision drilling and placing the 5.0 cortical screw with AP image saved. The wounds were all thoroughly irrigated and the deep fascia repaired using 0 Vicryl 2-0 Vicryl substantially and skin staples. Xeroform ABDs and honeycomb dressing is applied  PLAN OF CARE: Admit to inpatient   PATIENT DISPOSITION:  PACU - hemodynamically stable.

## 2016-07-01 NOTE — Progress Notes (Signed)
Patient was admitted to room 146 from surgery via room bed. A&O x4.  NSL in place and dressings x3 from surgery. PPP. CMTS WNL. Friends at bedside. Bed alarm in place for safety. TED on left leg, along with foot pumps on. Reviewed POC.

## 2016-07-01 NOTE — Transfer of Care (Signed)
Immediate Anesthesia Transfer of Care Note  Patient: Renee Cabrera  Procedure(s) Performed: Procedure(s): INTRAMEDULLARY (IM) NAIL EXCHANGE OF FEMORAL ROD (Right)  Patient Location: PACU  Anesthesia Type:General  Level of Consciousness: awake, alert  and oriented  Airway & Oxygen Therapy: Patient Spontanous Breathing and Patient connected to face mask oxygen  Post-op Assessment: Report given to RN and Post -op Vital signs reviewed and stable  Post vital signs: Reviewed and stable  Last Vitals:  Vitals:   07/01/16 0822  BP: 140/79  Pulse: 83  Resp: 16  Temp: 36.9 C    Last Pain:  Vitals:   07/01/16 0822  TempSrc: Oral         Complications: No apparent anesthesia complications

## 2016-07-01 NOTE — Progress Notes (Signed)
Lab and Respiratory here for labs and EKG.  Urine Specimen obtained and also MRSA swab obtained Also.

## 2016-07-01 NOTE — Anesthesia Postprocedure Evaluation (Signed)
Anesthesia Post Note  Patient: [name]  Procedure(s) Performed: Procedure(s) (LRB): INTRAMEDULLARY (IM) NAIL EXCHANGE OF FEMORAL ROD (Right)  Patient location during evaluation: PACU Anesthesia Type: General Level of consciousness: awake and alert Pain management: pain level controlled Vital Signs Assessment: post-procedure vital signs reviewed and stable Respiratory status: spontaneous breathing, nonlabored ventilation, respiratory function stable and patient connected to nasal cannula oxygen Cardiovascular status: blood pressure returned to baseline and stable Postop Assessment: no signs of nausea or vomiting Anesthetic complications: no    Last Vitals:  Vitals:   07/01/16 1700 07/01/16 1747  BP: (!) 98/58 (!) 91/50  Pulse: 85 89  Resp:    Temp:      Last Pain:  Vitals:   07/01/16 1634  TempSrc: Oral  PainSc:                  Molli Barrows

## 2016-07-01 NOTE — Anesthesia Preprocedure Evaluation (Signed)
Anesthesia Evaluation  Patient identified by MRN, date of birth, ID band Patient awake    Reviewed: Allergy & Precautions, H&P , NPO status , Patient's Chart, lab work & pertinent test results, reviewed documented beta blocker date and time   Airway Mallampati: II  TM Distance: >3 FB Neck ROM: full    Dental  (+) Teeth Intact   Pulmonary neg pulmonary ROS, asthma ,    Pulmonary exam normal        Cardiovascular hypertension, negative cardio ROS Normal cardiovascular exam Rhythm:regular Rate:Normal     Neuro/Psych  Headaches, PSYCHIATRIC DISORDERS negative neurological ROS  negative psych ROS   GI/Hepatic negative GI ROS, Neg liver ROS,   Endo/Other  negative endocrine ROS  Renal/GU negative Renal ROS  negative genitourinary   Musculoskeletal   Abdominal   Peds  Hematology negative hematology ROS (+) anemia ,   Anesthesia Other Findings Past Medical History: No date: Anemia No date: Arthritis     Comment: osteoarthritis No date: Asthma     Comment: remote h/o asthma No date: Depression     Comment: controlled No date: H/O hypoglycemia No date: H/O: GI bleed No date: Headache(784.0)     Comment: h/o migraines No date: Hearing loss, bilateral     Comment: wears hearing aides No date: History of blood transfusion     Comment: pt states she had yellow jaundice No date: Hypertension 1995: Lupus (systemic lupus erythematosus) ([facility])     Comment: in remission since 2005 No date: Nasal sinus congestion No date: Osteoporosis with fracture No date: Stress fracture of foot     Comment: left foot No date: Vitamin B 12 deficiency Past Surgical History: 2009: ANTERIOR CERVICAL DECOMP/DISCECTOMY FUSION 2012: ORIF FEMUR FRACTURE 1963: PARTIAL GASTRECTOMY     Comment: and reconstruction 1973 1945: TONSILLECTOMY BMI    Body Mass Index:  18.75 kg/m     Reproductive/Obstetrics negative OB ROS                              Anesthesia Physical Anesthesia Plan  ASA: III  Anesthesia Plan: General ETT   Post-op Pain Management:    Induction: Intravenous and Cricoid pressure planned  Airway Management Planned:   Additional Equipment:   Intra-op Plan:   Post-operative Plan:   Informed Consent: I have reviewed the patients History and Physical, chart, labs and discussed the procedure including the risks, benefits and alternatives for the proposed anesthesia with the patient or authorized representative who has indicated his/her understanding and acceptance.   Dental Advisory Given  Plan Discussed with: CRNA  Anesthesia Plan Comments: (Good cervical rom without pain or upper extr sx.  JA)        Anesthesia Quick Evaluation

## 2016-07-01 NOTE — Anesthesia Postprocedure Evaluation (Deleted)
Anesthesia Post Note  Patient: [name]  Procedure(s) Performed: Procedure(s) (LRB): INTRAMEDULLARY (IM) NAIL EXCHANGE OF FEMORAL ROD (Right)  Patient location during evaluation: PACU Anesthesia Type: General Level of consciousness: awake and alert Pain management: pain level controlled Vital Signs Assessment: post-procedure vital signs reviewed and stable Respiratory status: spontaneous breathing, nonlabored ventilation, respiratory function stable and patient connected to nasal cannula oxygen Cardiovascular status: blood pressure returned to baseline and stable Postop Assessment: no signs of nausea or vomiting Anesthetic complications: no    Last Vitals:  Vitals:   07/01/16 0822  BP: 140/79  Pulse: 83  Resp: 16  Temp: 36.9 C    Last Pain:  Vitals:   07/01/16 [id number]  TempSrc: Oral                 Molli Barrows

## 2016-07-01 NOTE — Progress Notes (Signed)
BP 98/58 after 250cc bolus. Admit orders of Lisinopril 2.5mg  . Notified MD. New orders to hold med if SBP less than 110. Order placed.

## 2016-07-01 NOTE — Care Management (Signed)
Per attending, patient lives alone and it is anticipated that patient will required skilled nursing placement.  CSW aware.  Will not assess copay for Lovenox at present. CM not able to assess as patient sleeping post op

## 2016-07-01 NOTE — NC FL2 (Signed)
[place] LEVEL OF CARE SCREENING TOOL     IDENTIFICATION  Patient Name: Renee Cabrera Birthdate: 23-Dec-1940 Sex: female Admission Date (Current Location): 07/01/2016  [place] and [place] Number:  Engineering geologist and Address:  [facility], [address], [place], [place] [address]      Provider Number: [id number]  Attending Physician Name and Address:  Hessie Knows, MD  Relative Name and Phone Number:       Current Level of Care: Hospital Recommended Level of Care: [place] Prior Approval Number:    Date Approved/Denied:   PASRR Number:  ([id number])  Discharge Plan: SNF    Current Diagnoses: Patient Active Problem List   Diagnosis Date Noted  . 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 Iron deficiency anemia 05/04/2015  . 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  Cataract cortical, senile    Chickenpox    Depression    Hypoglycemia    Senile osteoporosis  Fosamax. Atypical femur fracture.  Metatarsal fracture  nonunion, fifth.  Peptic ulcer    Chronic diarrhea    Diverticulosis    Lupus (CMS-HCC)       Orientation RESPIRATION BLADDER Height & Weight     Self, Time, Situation, Place  Normal Continent Weight: 97 lb (44 kg) Height:  5' (152.4 cm)  BEHAVIORAL SYMPTOMS/MOOD NEUROLOGICAL BOWEL NUTRITION STATUS   (None.)  (None. ) Continent Diet (Diet: Regular)   AMBULATORY STATUS COMMUNICATION OF NEEDS Skin   Extensive Assist Verbally Surgical wounds (Incision: Right Leg)                       Personal Care Assistance Level of Assistance  Bathing, Feeding, Dressing Bathing Assistance: Limited assistance Feeding assistance: Independent Dressing Assistance: Limited assistance     Functional Limitations Info  Sight, Hearing, Speech Sight Info: Adequate Hearing Info: Adequate Speech Info: Adequate    SPECIAL CARE FACTORS FREQUENCY  PT (By licensed PT), OT (By licensed OT)     PT Frequency:  (5) OT Frequency:  (5)            Contractures      Additional Factors Info  Code Status, Allergies Code Status Info:  (Full Code. ) Allergies Info:  (Allergies: Sulfa Antibiotics )           Current Medications (07/01/2016):  This is the current hospital active medication list Current Facility-Administered Medications  Medication Dose Route Frequency Provider Last Rate Last Dose  . 0.9 %  sodium chloride infusion   Intravenous Continuous Hessie Knows, MD      . acetaminophen (TYLENOL) tablet 650 mg  650 mg Oral Q6H PRN Hessie Knows, MD       Or  . acetaminophen (TYLENOL) suppository 650 mg  650 mg Rectal Q6H PRN Hessie Knows, MD      . albuterol (PROVENTIL) (2.5 MG/3ML) 0.083% nebulizer solution 2.5 mg  2.5 mg Inhalation Q6H PRN Hessie Knows, MD      . alum &  mag hydroxide-simeth (MAALOX/MYLANTA) 200-200-20 MG/5ML suspension 30 mL  30 mL Oral Q4H PRN Hessie Knows, MD      . aspirin EC tablet 81 mg  81 mg Oral Daily Hessie Knows, MD      . bisacodyl (DULCOLAX) suppository 10 mg  10 mg Rectal Daily PRN Hessie Knows, MD      . calcium carbonate (TUMS - dosed in mg elemental calcium) chewable tablet 600 mg of elemental calcium  600 mg of elemental calcium Oral BID Hessie Knows, MD      . ceFAZolin (ANCEF) 1 GM/50ML IVPB           . ceFAZolin (ANCEF) IVPB 1 g/50 mL premix  1 g Intravenous Q6H Hessie Knows, MD      . cholecalciferol  (VITAMIN D) tablet 1,000 Units  1,000 Units Oral Daily Hessie Knows, MD      . citalopram (CELEXA) tablet 20 mg  20 mg Oral Daily Hessie Knows, MD      . docusate sodium (COLACE) capsule 100 mg  100 mg Oral BID Hessie Knows, MD      . Derrill Memo ON 07/02/2016] enoxaparin (LOVENOX) injection 40 mg  40 mg Subcutaneous Q24H Hessie Knows, MD      . famotidine (PEPCID) 20 MG tablet           . gabapentin (NEURONTIN) capsule 100 mg  100 mg Oral QHS Hessie Knows, MD      . HYDROcodone-acetaminophen (NORCO/VICODIN) 5-325 MG per tablet 1 tablet  1 tablet Oral Q4H PRN Hessie Knows, MD      . latanoprost (XALATAN) 0.005 % ophthalmic solution 1 drop  1 drop Both Eyes QHS Hessie Knows, MD      . lisinopril (PRINIVIL,ZESTRIL) tablet 2.5 mg  2.5 mg Oral Daily Hessie Knows, MD      . magnesium citrate solution 1 Bottle  1 Bottle Oral Once PRN Hessie Knows, MD      . magnesium hydroxide (MILK OF MAGNESIA) suspension 30 mL  30 mL Oral Daily PRN Hessie Knows, MD      . meloxicam [facility]) tablet 7.5 mg  7.5 mg Oral Daily Hessie Knows, MD      . menthol-cetylpyridinium (CEPACOL) lozenge 3 mg  1 lozenge Oral PRN Hessie Knows, MD       Or  . phenol (CHLORASEPTIC) mouth spray 1 spray  1 spray Mouth/Throat PRN Hessie Knows, MD      . metoCLOPramide (REGLAN) tablet 5-10 mg  5-10 mg Oral Q8H PRN Hessie Knows, MD       Or  . metoCLOPramide (REGLAN) injection 5-10 mg  5-10 mg Intravenous Q8H PRN Hessie Knows, MD      . mirtazapine (REMERON) tablet 30 mg  30 mg Oral QHS Hessie Knows, MD      . morphine 2 MG/ML injection 2 mg  2 mg Intravenous Q2H PRN Hessie Knows, MD      . multivitamin with minerals tablet 1 tablet  1 tablet Oral Daily Hessie Knows, MD      . multivitamin-lutein [facility]) capsule 2 capsule  2 capsule Oral Daily Hessie Knows, MD      . ondansetron [facility]) tablet 4 mg  4 mg Oral Q6H PRN Hessie Knows, MD       Or  . ondansetron [facility]) injection 4 mg  4 mg Intravenous Q6H PRN Hessie Knows, MD      .  pantoprazole (PROTONIX) EC tablet 40 mg  40 mg Oral BID AC Hessie Knows, MD      .  vitamin C (ASCORBIC ACID) tablet 1,000 mg  1,000 mg Oral BID Hessie Knows, MD      . zolpidem [facility]) tablet 5 mg  5 mg Oral QHS PRN Hessie Knows, MD         Discharge Medications: Please see discharge summary for a list of discharge medications.  Relevant Imaging Results:  Relevant Lab Results:   Additional Information  (SSN: [id number])  Danie Chandler, [place] [employer] [phone]

## 2016-07-01 NOTE — Anesthesia Procedure Notes (Signed)
Procedure Name: Intubation Date/Time: 07/01/2016 10:45 AM Performed by: Nelda Marseille Pre-anesthesia Checklist: Patient identified, Patient being monitored, Timeout performed, Emergency Drugs available and Suction available Patient Re-evaluated:Patient Re-evaluated prior to inductionOxygen Delivery Method: Circle system utilized Preoxygenation: Pre-oxygenation with 100% oxygen Intubation Type: IV induction Ventilation: Mask ventilation without difficulty Laryngoscope Size: Mac and 3 Grade View: Grade I Tube type: Oral Tube size: 7.0 mm Number of attempts: 1 Airway Equipment and Method: Stylet Placement Confirmation: ETT inserted through vocal cords under direct vision,  positive ETCO2 and breath sounds checked- equal and bilateral Secured at: 21 cm Tube secured with: Tape Dental Injury: Teeth and Oropharynx as per pre-operative assessment

## 2016-07-02 ENCOUNTER — Encounter: Payer: Self-pay | Admitting: Orthopedic Surgery

## 2016-07-02 LAB — CBC
HCT: 27.5 % — ABNORMAL LOW (ref 35.0–47.0)
Hemoglobin: 9.4 g/dL — ABNORMAL LOW (ref 12.0–16.0)
MCH: 32.8 pg (ref 26.0–34.0)
MCHC: 34.1 g/dL (ref 32.0–36.0)
MCV: 96 fL (ref 80.0–100.0)
PLATELETS: 238 10*3/uL (ref 150–440)
RBC: 2.86 MIL/uL — AB (ref 3.80–5.20)
RDW: 13.1 % (ref 11.5–14.5)
WBC: 11.6 10*3/uL — AB (ref 3.6–11.0)

## 2016-07-02 LAB — BASIC METABOLIC PANEL
ANION GAP: 5 (ref 5–15)
BUN: 15 mg/dL (ref 6–20)
CALCIUM: 7.8 mg/dL — AB (ref 8.9–10.3)
CO2: 26 mmol/L (ref 22–32)
Chloride: 108 mmol/L (ref 101–111)
Creatinine, Ser: 0.62 mg/dL (ref 0.44–1.00)
Glucose, Bld: 93 mg/dL (ref 65–99)
POTASSIUM: 3.7 mmol/L (ref 3.5–5.1)
Sodium: 139 mmol/L (ref 135–145)

## 2016-07-02 LAB — URINE CULTURE: CULTURE: NO GROWTH

## 2016-07-02 MED ORDER — FE FUMARATE-B12-VIT C-FA-IFC PO CAPS
1.0000 | ORAL_CAPSULE | Freq: Three times a day (TID) | ORAL | Status: DC
  Administered 2016-07-02 – 2016-07-04 (×7): 1 via ORAL
  Filled 2016-07-02 (×7): qty 1

## 2016-07-02 NOTE — Progress Notes (Signed)
Talked with MD about patient's low BP's, will continue to monitor and keep IV fluids running.

## 2016-07-02 NOTE — Progress Notes (Signed)
Physical Therapy Treatment Patient Details Name: HEMMA CANEL MRN: [id number] DOB: [birth year] Today's Date: 07/02/2016    History of Present Illness Pt is a 75 y.o. female s/p 07/01/16 R IMN exchange of femoral rod secondary to nondisplaced transverse fx of R femur.  Pt reports stepping wrong and has h/o fx's from moving "the wrong way".  PMH includes: "brittle bones" (per pt report), anemia, asthma, B hearing loss (hearing aides), htn, Lupus, stress fx L foot.    PT Comments    Pt agreeable to PT; pt notes she is ready to get back to bed, as she is quite uncomfortable in the chair. Pt would benefit from pillow to sit on next time up in chair. Pt rates pain 5/10 in R hip/thigh currently. Pt participates in seated and supine exercises for Bilateral lower extremities with assist as needed. Pt's blood pressure 100/49 before attempt to ambulate. Ambulation held to chair to bed at this time. Continue PT to progress strength and endurance to improve all functional mobility to prior level of function.   Follow Up Recommendations  SNF (pending progress)     Equipment Recommendations   (pt already owns RW)    Recommendations for Other Services       Precautions / Restrictions Precautions Precautions: Fall Restrictions Weight Bearing Restrictions: Yes RLE Weight Bearing: Weight bearing as tolerated    Mobility  Bed Mobility Overal bed mobility: Needs Assistance Bed Mobility: Sit to Supine     Supine to sit: Supervision;HOB elevated Sit to supine: Min assist   General bed mobility comments: Assist for LEs; repositions upward in bed with rails  Transfers Overall transfer level: Needs assistance Equipment used: Rolling walker (2 wheeled) Transfers: Sit to/from Stand Sit to Stand: Min guard         General transfer comment: Cues for safe hand placement   Ambulation/Gait Ambulation/Gait assistance: Min guard Ambulation Distance (Feet): 3 Feet Assistive device: Rolling walker  (2 wheeled) Gait Pattern/deviations: Step-to pattern Gait velocity: decreased Gait velocity interpretation: Below normal speed for age/gender General Gait Details: Chair to bed only, as BP 100/49   Stairs            Wheelchair Mobility    Modified Rankin (Stroke Patients Only)       Balance Overall balance assessment: Needs assistance Sitting-balance support: Feet supported;Bilateral upper extremity supported Sitting balance-Leahy Scale: Good     Standing balance support: Bilateral upper extremity supported Standing balance-Leahy Scale: Fair Standing balance comment: static standing                    Cognition Arousal/Alertness: Awake/alert Behavior During Therapy: WFL for tasks assessed/performed Overall Cognitive Status: Within Functional Limits for tasks assessed                      Exercises General Exercises - Lower Extremity Ankle Circles/Pumps: AROM;Both;20 reps;Supine Quad Sets: Strengthening;Both;20 reps;Supine Gluteal Sets: Strengthening;Both;20 reps;Supine Short Arc Quad: AROM;Both;10 reps;Supine Long Arc Quad: AROM;Both;10 reps;Seated Heel Slides: AAROM;Right;10 reps;Supine (AROM L) Hip ABduction/ADduction: AAROM;Right;10 reps;Supine (AROM L ) Straight Leg Raises: AAROM;10 reps;Supine;Both Hip Flexion/Marching: AROM;Strengthening;Both;10 reps;Seated Toe Raises: AROM;Both;10 reps;Seated Heel Raises: AROM;Both;10 reps;Seated    General Comments General comments (skin integrity, edema, etc.): Small amount of drainage noted R thigh dressing beginning and end of session (no change during session); L UE IV leaking blood (nursing notified and came to address)      Pertinent Vitals/Pain Pain Assessment: 0-10 Pain Score: 5  Pain Location: R  hip Pain Descriptors / Indicators: Sharp;Constant Pain Intervention(s): Monitored during session;Limited activity within patient's tolerance;Premedicated before session;Repositioned    Home Living  Family/patient expects to be discharged to:: Private residence Living Arrangements: Alone Available Help at Discharge: Neighbor Type of Home: House Home Access: Stairs to enter Entrance Stairs-Rails: Right Home Layout: Two level;Able to live on main level with bedroom/bathroom Home Equipment: Hand held shower head;Grab bars - tub/shower;Walker - 2 wheels;Cane - single point      Prior Function Level of Independence: Independent      Comments: Pt delivers MOW 1x/month.   PT Goals (current goals can now be found in the care plan section) Acute Rehab PT Goals Patient Stated Goal: to be able to walk again PT Goal Formulation: With patient Time For Goal Achievement: 07/16/16 Potential to Achieve Goals: Good    Frequency  BID    PT Plan Current plan remains appropriate    Co-evaluation             End of Session Equipment Utilized During Treatment: Gait belt Activity Tolerance: Patient tolerated treatment well;Other (comment) (low BP) Patient left: in bed;with call bell/phone within reach;with bed alarm set;with SCD's reapplied     Time: [id number] PT Time Calculation (min) (ACUTE ONLY): 36 min  Charges:  $Gait Training: 8-22 mins $Therapeutic Exercise: 8-22 mins $Therapeutic Activity: 8-22 mins                    G Codes:      Charlaine Dalton, PTA 07/02/2016, 3:27 PM

## 2016-07-02 NOTE — Progress Notes (Signed)
Received report on this pt from [place], [place], assumed care of pt.

## 2016-07-02 NOTE — Progress Notes (Signed)
   Subjective: 1 Day Post-Op Procedure(s) (LRB): INTRAMEDULLARY (IM) NAIL EXCHANGE OF FEMORAL ROD (Right) Patient reports pain as mild.   Patient is well, and has had no acute complaints or problems Denies any CP, SOB, ABD pain. We will start physical therapy today.    Objective: Vital signs in last 24 hours: Temp:  [97.2 F (36.2 C)-99.5 F (37.5 C)] 98 F (36.7 C) (09/06 0727) Pulse Rate:  [80-105] 91 (09/06 0727) Resp:  [14-21] 18 (09/06 0727) BP: (67-121)/(40-73) 112/52 (09/06 0727) SpO2:  [94 %-98 %] 95 % (09/06 0727) FiO2 (%):  [8 %] 8 % (09/05 1250) Weight:  [44 kg (97 lb)] 44 kg (97 lb) (09/05 1410)  Intake/Output from previous day: 09/05 0701 - 09/06 0700 In: 2267.1 [I.V.:2167.1; IV Piggyback:100] Out: 975 [Urine:675; Blood:300] Intake/Output this shift: No intake/output data recorded.   Recent Labs  07/01/16 0815 07/01/16 1624 07/02/16 0434  HGB 14.8 11.5* 9.4*    Recent Labs  07/01/16 0815 07/02/16 0434  WBC 8.1 11.6*  RBC 4.53 2.86*  HCT 42.9 27.5*  PLT 293 238    Recent Labs  07/01/16 0815 07/02/16 0434  NA 140 139  K 3.9 3.7  CL 105 108  CO2 28 26  BUN 15 15  CREATININE 0.74 0.62  GLUCOSE 100* 93  CALCIUM 9.3 7.8*    Recent Labs  07/01/16 0815  INR 0.92    EXAM General - Patient is Alert, Appropriate and Oriented Extremity - Neurovascular intact Sensation intact distally Intact pulses distally Dorsiflexion/Plantar flexion intact No cellulitis present Compartment soft Dressing - dressing C/D/I and scant drainage Motor Function - intact, moving foot and toes well on exam.   Past Medical History:  Diagnosis Date  . Anemia   . Arthritis    osteoarthritis  . Asthma    remote h/o asthma  . Depression    controlled  . H/O hypoglycemia   . H/O: GI bleed   . Headache(784.0)    h/o migraines  . Hearing loss, bilateral    wears hearing aides  . History of blood transfusion    pt states she had yellow jaundice  .  Hypertension   . Lupus (systemic lupus erythematosus) ([facility]) 1995   in remission since 2005  . Nasal sinus congestion   . Osteoporosis with fracture   . Stress fracture of foot    left foot  . 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   Weight as of this encounter: 44 kg (97 lb). Advance diet Up with therapy  Needs BM Acute post op blood loss anemia- start Fe supplement, recheck labs in the am CM to assist with discharge    DVT Prophylaxis - Lovenox, Foot Pumps and TED hose Weight-Bearing as tolerated to right leg   T. Rachelle Hora, PA-C [place] 07/02/2016, 8:22 AM

## 2016-07-02 NOTE — Evaluation (Signed)
Physical Therapy Evaluation Patient Details Name: Renee Cabrera MRN: [id number] DOB: [birth year] Today's Date: 07/02/2016   History of Present Illness  Pt is a 75 y.o. female s/p 07/01/16 R IMN exchange of femoral rod secondary to nondisplaced transverse fx of R femur.  Pt reports stepping wrong and has h/o fx's from moving "the wrong way".  PMH includes: "brittle bones" (per pt report), anemia, asthma, B hearing loss (hearing aides), htn, Lupus, stress fx L foot.  Clinical Impression  Prior to admission, pt was independent with functional mobility.  Pt reports h/o fx's d/t "brittle bones" with moving "the wrong way" and has gone through rehab before.  Pt lives alone on main level of home with 3 steps to enter with R railing.  Currently pt is supervision supine to sit and min assist with transfers and ambulation short distance with RW.  Pt demonstrating decreased cadence with ambulation.  Pt's BP supine in bed 118/53 and after ambulating short distance pt's BP decreased to 89/39 (sitting in chair).  After reclining pt some in chair and elevating LE's, pt's BP improved to 94/49.  Nursing notified of pt's BP during session and that pt was asymptomatic.  Pt would benefit from skilled PT to address noted impairments and functional limitations.  Recommend pt discharge to  STR pending pt's progress when medically appropriate.    Follow Up Recommendations  (STR pending progress)    Equipment Recommendations   (pt already owns RW)    Recommendations for Other Services       Precautions / Restrictions Precautions Precautions: Fall Restrictions Weight Bearing Restrictions: Yes RLE Weight Bearing: Weight bearing as tolerated      Mobility  Bed Mobility Overal bed mobility: Needs Assistance Bed Mobility: Supine to Sit     Supine to sit: Supervision;HOB elevated     General bed mobility comments: increased effort and time to perform on pt's own  Transfers Overall transfer level: Needs  assistance Equipment used: Rolling walker (2 wheeled) Transfers: Sit to/from Stand Sit to Stand: Min assist         General transfer comment: vc's for hand placement; pt NWB'ing R LE when attempting to stand  Ambulation/Gait Ambulation/Gait assistance: Min guard;Min assist Ambulation Distance (Feet): 10 Feet Assistive device: Rolling walker (2 wheeled)   Gait velocity: decreased   General Gait Details: antalgic; decreased stance time R LE  Stairs            Wheelchair Mobility    Modified Rankin (Stroke Patients Only)       Balance Overall balance assessment: Needs assistance Sitting-balance support: Bilateral upper extremity supported;Feet supported Sitting balance-Leahy Scale: Good     Standing balance support: Bilateral upper extremity supported (on RW) Standing balance-Leahy Scale: Good Standing balance comment: static standing                             Pertinent Vitals/Pain Pain Assessment: 0-10 Pain Score: 4  Pain Location: R thigh Pain Descriptors / Indicators: Aching Pain Intervention(s): Limited activity within patient's tolerance  HR 84-103 bpm during session.    Home Living Family/patient expects to be discharged to:: Private residence Living Arrangements: Alone Available Help at Discharge: Neighbor Type of Home: House Home Access: Stairs to enter Entrance Stairs-Rails: Right Entrance Stairs-Number of Steps: 3 Home Layout: Two level;Able to live on main level with bedroom/bathroom Home Equipment: Hand held shower head;Grab bars - tub/shower;Walker - 2 wheels;Cane - single point  Prior Function Level of Independence: Independent         Comments: Pt delivers MOW 1x/month.     Hand Dominance        Extremity/Trunk Assessment   Upper Extremity Assessment: Overall WFL for tasks assessed           Lower Extremity Assessment: RLE deficits/detail;LLE deficits/detail RLE Deficits / Details: R hip flexion at  least 3-/5 (limited d/t pain); R knee flexion/extension at least 3/5 AROM; DF at least 3+/5 LLE Deficits / Details: Strength and ROM WFL     Communication   Communication: No difficulties  Cognition Arousal/Alertness: Awake/alert Behavior During Therapy: WFL for tasks assessed/performed Overall Cognitive Status: Within Functional Limits for tasks assessed                      General Comments General comments (skin integrity, edema, etc.): Small amount of drainage noted R thigh dressing beginning and end of session (no change during session); L UE IV leaking blood (nursing notified and came to address).  Nursing cleared pt for participation in physical therapy.  Pt agreeable to PT session.    Exercises General Exercises - Lower Extremity Ankle Circles/Pumps: AROM;Strengthening;Both;10 reps;Supine Quad Sets: AROM;Strengthening;Both;10 reps;Supine Gluteal Sets: AROM;Strengthening;Both;10 reps;Supine Short Arc Quad: AROM;Strengthening;Both;10 reps;Supine Heel Slides: Strengthening;Both;10 reps;Supine (AAROM R; AROM L) Hip ABduction/ADduction: Strengthening;Both;10 reps;Supine (AAROM R; AROM L)      Assessment/Plan    PT Assessment Patient needs continued PT services  PT Diagnosis Difficulty walking;Acute pain   PT Problem List Decreased strength;Decreased activity tolerance;Decreased balance;Decreased mobility  PT Treatment Interventions DME instruction;Gait training;Stair training;Functional mobility training;Therapeutic activities;Therapeutic exercise;Balance training;Patient/family education   PT Goals (Current goals can be found in the Care Plan section) Acute Rehab PT Goals Patient Stated Goal: to be able to walk again PT Goal Formulation: With patient Time For Goal Achievement: 07/16/16 Potential to Achieve Goals: Good    Frequency BID   Barriers to discharge Decreased caregiver support      Co-evaluation               End of Session Equipment Utilized  During Treatment: Gait belt Activity Tolerance: Patient tolerated treatment well Patient left: in chair;with call bell/phone within reach;with chair alarm set;with SCD's reapplied (B heels elevated via pillow) Nurse Communication: Mobility status;Precautions (BP status)         Time: [id number] PT Time Calculation (min) (ACUTE ONLY): 54 min   Charges:   PT Evaluation $PT Eval Low Complexity: 1 Procedure PT Treatments $Therapeutic Exercise: 8-22 mins $Therapeutic Activity: 8-22 mins   PT G CodesLeitha Bleak Jul 03, 2016, 1:46 PM Leitha Bleak, [place]

## 2016-07-02 NOTE — Clinical Social Work Note (Signed)
Clinical Social Work Assessment  Patient Details  Name: Renee Cabrera MRN: [id number] Date of Birth: Aug 28, 1941  Date of referral:  07/02/16               Reason for consult:  Facility Placement                Permission sought to share information with:  Chartered certified accountant granted to share information::  Yes, Verbal Permission Granted  Name::      Renee Cabrera::   Renee Cabrera   Relationship::     Contact Information:     Housing/Transportation Living arrangements for the past 2 months:  [place] of Information:  Patient Patient Interpreter Needed:  None Criminal Activity/Legal Involvement Pertinent to Current Situation/Hospitalization:  No - Comment as needed Significant Relationships:  Other Family Members Lives with:  Self Do you feel safe going back to the place where you live?  Yes Need for family participation in patient care:  No (Coment)  Care giving concerns:  Patient lives alone in [place].    Social Worker assessment / plan:  Holiday representative (CSW) received SNF consult. PT is pending. OT is recommending SNF. Physician is also recommending SNF. CSW met with patient alone at bedside to discuss D/C plan. Patient was alert and oriented and was sitting up in the chair. CSW introduced self and explained role of CSW department. Patient reported that she lives alone in [place] and has 1 dog and 2 cats. Per patient she has no children and her primary support comes from her cousin Renee Cabrera and Renee Cabrera's husband Renee Cabrera. CSW explained SNF process. Patient is agreeable to SNF search in [facility]. CSW presented bed offers. Patient chose [facility]. Kim admissions coordinator is aware of accepted bed offer. CSW will continue to follow and assist as needed.   Employment status:  Retired Nurse, adult PT Recommendations:  [place] / Referral to  community resources:  [place]  Patient/Family's Response to care:  Patient is agreeable to going to [employer]   Patient/Family's Understanding of and Emotional Response to Diagnosis, Current Treatment, and Prognosis:  Patient was pleasant and thanked CSW for visit.   Emotional Assessment Appearance:  Appears stated age Attitude/Demeanor/Rapport:    Affect (typically observed):  Accepting, Adaptable, Pleasant Orientation:  Oriented to Self, Oriented to Place, Oriented to  Time, Oriented to Situation Alcohol / Substance use:  Not Applicable Psych involvement (Current and /or in the community):  No (Comment)  Discharge Needs  Concerns to be addressed:  Discharge Planning Concerns Readmission within the last 30 days:  No Current discharge risk:  Dependent with Mobility Barriers to Discharge:  Continued Medical Work up   [employer], Veronia Beets, LCSW 07/02/2016, 1:24 PM

## 2016-07-02 NOTE — Clinical Social Work Placement (Signed)
   CLINICAL SOCIAL WORK PLACEMENT  NOTE  Date:  07/02/2016  Patient Details  Name: STASHA BRAZ MRN: [id number] Date of Birth: [birth year]  Clinical Social Work is seeking post-discharge placement for this patient at the [place] level of care (*CSW will initial, date and re-position this form in  chart as items are completed):  Yes   Patient/family provided with [place] Work Department's list of facilities offering this level of care within the geographic area requested by the patient (or if unable, by the patient's family).  Yes   Patient/family informed of their freedom to choose among providers that offer the needed level of care, that participate in Medicare, Medicaid or managed care program needed by the patient, have an available bed and are willing to accept the patient.  Yes   Patient/family informed of [place]'s ownership interest in [facility] and [facility], as well as of the fact that they are under no obligation to receive care at these facilities.  PASRR submitted to EDS on 07/01/16     PASRR number received on 07/01/16     Existing PASRR number confirmed on       FL2 transmitted to all facilities in geographic area requested by pt/family on 07/02/16     FL2 transmitted to all facilities within larger geographic area on       Patient informed that his/her managed care company has contracts with or will negotiate with certain facilities, including the following:        Yes   Patient/family informed of bed offers received.  Patient chooses bed at  [facility] )     Physician recommends and patient chooses bed at      Patient to be transferred to   on  .  Patient to be transferred to facility by       Patient family notified on   of transfer.  Name of family member notified:        PHYSICIAN       Additional Comment:    _______________________________________________ Zaleigh Bermingham, Veronia Beets,  LCSW 07/02/2016, 1:23 PM

## 2016-07-02 NOTE — Evaluation (Signed)
Occupational Therapy Evaluation Patient Details Name: Renee Cabrera MRN: [id number] DOB: [birth year] Today's Date: 07/02/2016    History of Present Illness Pt. is a 75 y.o. female who was admitted to [facility] for a right intramedullary nail exchange fo a Femoral rod.    Clinical Impression  Pt. Is a 75 y.o. Female who was admitted to [facility] for a right intramedullary nail exchange to the femoral rod. Pt. Presents with pain, decreased activity tolerance, decreased functional mobility, low BP which hinder her ability to complete ADL tasks. Pt. Could benefit from skilled OT services to work on improving ADl, and A/E training, UE strength, ROM, and functional mobility for ADLs. Follow-up OT services are warranted upon discharge.    Follow Up Recommendations  SNF    Equipment Recommendations       Recommendations for Other Services PT consult     Precautions / Restrictions Restrictions Weight Bearing Restrictions: Yes RLE Weight Bearing: Weight bearing as tolerated      Mobility Bed Mobility  Pt. Up in chair upon arrival                  Transfers  Deferred secondary to low BP                      Balance                                            ADL Overall ADL's : Needs assistance/impaired Eating/Feeding: Set up   Grooming: Set up               Lower Body Dressing: Maximal assistance                 General ADL Comments: Pt. education was provided about A/E use for LE dressing.     Vision     Perception     Praxis      Pertinent Vitals/Pain Pain Assessment: 0-10 Pain Score: 4  Pain Descriptors / Indicators: Aching Pain Intervention(s): Limited activity within patient's tolerance     Hand Dominance     Extremity/Trunk Assessment Upper Extremity Assessment Upper Extremity Assessment: Overall WFL for tasks assessed           Communication Communication Communication: No difficulties   Cognition  Arousal/Alertness: Awake/alert Behavior During Therapy: WFL for tasks assessed/performed Overall Cognitive Status: Within Functional Limits for tasks assessed                     General Comments       Exercises       Shoulder Instructions      Home Living Family/patient expects to be discharged to:: Private residence Living Arrangements: Alone Available Help at Discharge: Neighbor Type of Home: House Home Access: Stairs to enter Technical brewer of Steps: 3 Entrance Stairs-Rails: Right Home Layout: Two level (Resides on the 1st floor.)     Bathroom Shower/Tub: Tub/shower unit;Curtain Shower/tub characteristics: Architectural technologist: Handicapped height     Home Equipment: Hand held shower head;Grab bars - tub/shower;Walker - 2 wheels;Cane - single point Management consultant, and sockaide.)          Prior Functioning/Environment Level of Independence: Independent             OT Diagnosis: Generalized weakness   OT Problem List: Decreased strength;Pain;Decreased activity tolerance;Decreased knowledge of use of DME  or AE   OT Treatment/Interventions: Self-care/ADL training;Therapeutic exercise;Therapeutic activities;Patient/family education    OT Goals(Current goals can be found in the care plan section)    OT Frequency: Min 1X/week   Barriers to D/C:            Co-evaluation              End of Session    Activity Tolerance: Patient tolerated treatment well Patient left: in chair;with call bell/phone within reach;with chair alarm set   Time: 1010-1045 OT Time Calculation (min): 35 min Charges:  OT General Charges $OT Visit: 1 Procedure OT Evaluation $OT Eval Moderate Complexity: 1 Procedure OT Treatments $Self Care/Home Management : 8-22 mins G-Codes:    Harrel Carina, MS, OTR/L 07/02/2016, 11:14 AM

## 2016-07-03 LAB — BASIC METABOLIC PANEL
ANION GAP: 3 — AB (ref 5–15)
BUN: 12 mg/dL (ref 6–20)
CHLORIDE: 114 mmol/L — AB (ref 101–111)
CO2: 26 mmol/L (ref 22–32)
CREATININE: 0.61 mg/dL (ref 0.44–1.00)
Calcium: 7.7 mg/dL — ABNORMAL LOW (ref 8.9–10.3)
GFR calc non Af Amer: >60 mL/min (ref >60)
Glucose, Bld: 100 mg/dL — ABNORMAL HIGH (ref 65–99)
POTASSIUM: 3.6 mmol/L (ref 3.5–5.1)
SODIUM: 143 mmol/L (ref 135–145)

## 2016-07-03 LAB — CBC
HEMATOCRIT: 22.4 % — AB (ref 35.0–47.0)
HEMOGLOBIN: 7.7 g/dL — AB (ref 12.0–16.0)
MCH: 32.8 pg (ref 26.0–34.0)
MCHC: 34.3 g/dL (ref 32.0–36.0)
MCV: 95.5 fL (ref 80.0–100.0)
Platelets: 167 10*3/uL (ref 150–440)
RBC: 2.35 MIL/uL — AB (ref 3.80–5.20)
RDW: 13.6 % (ref 11.5–14.5)
WBC: 7.6 10*3/uL (ref 3.6–11.0)

## 2016-07-03 LAB — SURGICAL PATHOLOGY

## 2016-07-03 LAB — PREPARE RBC (CROSSMATCH)

## 2016-07-03 LAB — HEMOGLOBIN: HEMOGLOBIN: 11.3 g/dL — AB (ref 12.0–16.0)

## 2016-07-03 LAB — ABO/RH: ABO/RH(D): O POS

## 2016-07-03 MED ORDER — ENOXAPARIN SODIUM 30 MG/0.3ML ~~LOC~~ SOLN: 30.0000 mg | SUBCUTANEOUS | Status: DC

## 2016-07-03 MED ORDER — SODIUM CHLORIDE 0.9 % IV SOLN
Freq: Once | INTRAVENOUS | Status: AC
  Administered 2016-07-03: 14:00:00 via INTRAVENOUS

## 2016-07-03 NOTE — Progress Notes (Signed)
Patient with Hgb 7.7, has history of chronic anemia requiring iron infusion and is very slow to make her own blood.  Sees hematologist for this.  Symptomatic with hypotension on standing.  Will transfuse 2 U PRBC's  today

## 2016-07-03 NOTE — Progress Notes (Signed)
Physical Therapy Treatment Patient Details Name: Renee Cabrera MRN: [id number] DOB: 27-Sep-1941 Today's Date: 07/03/2016    History of Present Illness Pt. is a 75 y.o. female who was admitted for an IMN femoral rod exchange secondary to a nondisplaced transverse Fracture of the right Femur.    PT Comments    Initial attempt for treatment this afternoon, pt with nursing initiating blood transfusion. Second attempt, nursing notes pt may participate as tolerated. Nursing informs therapist of active bleeding at surgical site and IV site; nursing has identified and marked. Pt is worried about increased bleeding; pt also notes fatigue and is noted to look more pale in color this afternoon. Limited exercises supine for isometrics and non surgical side active range of motion for only 10 repetitions. Pt left to rest and encouraged to perform isometrics as tolerated and range on left. Range on Right lower extremity depending on bleeding status. Continue PT to progress range and strength as appropriate to improve all functional mobility.   Follow Up Recommendations  SNF     Equipment Recommendations       Recommendations for Other Services       Precautions / Restrictions Precautions Precautions: Fall Restrictions Weight Bearing Restrictions: Yes RLE Weight Bearing: Weight bearing as tolerated    Mobility  Bed Mobility               General bed mobility comments: Not tested; pt receiving transfusion currently  Transfers                    Ambulation/Gait                 Stairs            Wheelchair Mobility    Modified Rankin (Stroke Patients Only)       Balance                                    Cognition Arousal/Alertness: Awake/alert Behavior During Therapy: WFL for tasks assessed/performed Overall Cognitive Status: Within Functional Limits for tasks assessed                      Exercises General Exercises - Lower  Extremity Ankle Circles/Pumps: AROM;Both;20 reps;Supine Quad Sets: Strengthening;Both;20 reps;Supine Gluteal Sets: Strengthening;Both;20 reps;Supine Short Arc Quad: AROM;Left;10 reps;Supine Heel Slides: AROM;Left;10 reps;Supine Hip ABduction/ADduction: AROM;Left;10 reps;Supine    General Comments        Pertinent Vitals/Pain Pain Assessment: 0-10 Pain Score: 3  Pain Location: R hip/knee Pain Descriptors / Indicators: Aching Pain Intervention(s): Limited activity within patient's tolerance    Home Living                      Prior Function            PT Goals (current goals can now be found in the care plan section) Acute Rehab PT Goals Patient Stated Goal: To return home. Progress towards PT goals: Progressing toward goals    Frequency  BID    PT Plan Current plan remains appropriate    Co-evaluation             End of Session   Activity Tolerance: Patient limited by fatigue;Other (comment) (active bleeding R hip surgical site) Patient left: in bed;with call bell/phone within reach;with bed alarm set;with SCD's reapplied     Time: [id number] PT Time Calculation (min) (  ACUTE ONLY): 15 min  Charges:  $Therapeutic Exercise: 8-22 mins                    G Codes:      Charlaine Dalton, PTA 07/03/2016, 3:18 PM

## 2016-07-03 NOTE — Progress Notes (Signed)
Patient has a small amount of bloody drainage on honeycomb dressing. Dressing was newly changed this morning by PA Gaines. Dr. Rudene Christians notified. Lovenox is being discontinued and aspirin 81 mg is being ordered instead. RN will continue to measure the drainage.   Deri Fuelling, RN

## 2016-07-03 NOTE — Progress Notes (Signed)
Occupational Therapy Treatment Patient Details Name: Renee Cabrera MRN: [id number] DOB: 10-28-1940 Today's Date: 07/03/2016    History of present illness Pt. is a 75 y.o. female who was admitted for an IMN femoral rod exchange secondary to a nondisplaced transverse Fracture of the right Femur.   OT comments  Pt. Presents with low hemoglobin of 7.7. Pt. is waiting for 2 units of blood to be transfused. Pt. was seen for bedside treatment. Pt. education was provided about A/E use for LE dressing, and general reacher, and A/E use. Pt. Presents with pain, decreased activity tolerance, weakness, low hemoglobin, episodes of low BP, and decreased functional mobility for ADLs which continue to hinder independence with ADLs. Pt. continues to benefit from skilled OT intervention at the next level of care. Pt. Is going to SNF upon discharge.   Follow Up Recommendations  SNF    Equipment Recommendations       Recommendations for Other Services PT consult    Precautions / Restrictions Precautions Precautions: Fall Restrictions Weight Bearing Restrictions: Yes RLE Weight Bearing: Weight bearing as tolerated                                             ADL Overall ADL's : Needs assistance/impaired Eating/Feeding: Set up   Grooming: Set up               Lower Body Dressing: Maximal assistance                 General ADL Comments: Pt. education was provided about A/E use for LE dressing.      Vision                     Perception     Praxis      Cognition   Behavior During Therapy: [facility] for tasks assessed/performed Overall Cognitive Status: Within Functional Limits for tasks assessed                       Extremity/Trunk Assessment               Exercises General Exercises - Lower Extremity Ankle Circles/Pumps: AROM;Both;20 reps;Supine Quad Sets: Strengthening;Both;20 reps;Supine Gluteal Sets: Strengthening;Both;20  reps;Supine Short Arc Quad: AROM;Both;20 reps;Supine;Other (comment) (allow several second pause/stretch in R knee flexion) Heel Slides: AAROM;Right;20 reps;Supine (L AROM) Hip ABduction/ADduction: AAROM;Right;20 reps;Supine (L AROM) Straight Leg Raises: AAROM;Both;10 reps;Supine (2 sets)   Shoulder Instructions       General Comments      Pertinent Vitals/ Pain       Pain Assessment: 0-10 Pain Score: 2  Pain Location: Right hip. Pain Descriptors / Indicators: Aching Pain Intervention(s): Monitored during session  Home Living                                          Prior Functioning/Environment              Frequency Min 1X/week     Progress Toward Goals  OT Goals(current goals can now be found in the care plan section)  Progress towards OT goals: Progressing toward goals  Acute Rehab OT Goals Patient Stated Goal: To return home.  Plan Discharge plan remains appropriate    Co-evaluation  End of Session     Activity Tolerance Patient tolerated treatment well   Patient Left with call bell/phone within reach;in bed;with bed alarm set   Nurse Communication          Time: [id number] OT Time Calculation (min): 23 min  Charges: OT General Charges $OT Visit: 1 Procedure OT Treatments $Self Care/Home Management : 23-37 mins  Harrel Carina, MS, OTR/L 07/03/2016, 11:39 AM

## 2016-07-03 NOTE — Progress Notes (Signed)
   Subjective: 2 Days Post-Op Procedure(s) (LRB): INTRAMEDULLARY (IM) NAIL EXCHANGE OF FEMORAL ROD (Right) Patient reports pain as mild.   Patient is well, and has had no acute complaints or problems. Denies any CP, SOB, ABD pain. We will start physical therapy today.    Objective: Vital signs in last 24 hours: Temp:  [98.5 F (36.9 C)-99 F (37.2 C)] 98.8 F (37.1 C) (09/07 0740) Pulse Rate:  [84-102] 102 (09/07 0740) Resp:  [16-18] 16 (09/07 0740) BP: (94-140)/(49-65) 139/65 (09/07 0740) SpO2:  [94 %-100 %] 96 % (09/07 0740)  Intake/Output from previous day: 09/06 0701 - 09/07 0700 In: 2200 [P.O.:600; I.V.:1600] Out: 1300 [Urine:1300] Intake/Output this shift: Total I/O In: 400 [I.V.:400] Out: -    Recent Labs  07/01/16 0815 07/01/16 1624 07/02/16 0434 07/03/16 0339  HGB 14.8 11.5* 9.4* 7.7*    Recent Labs  07/02/16 0434 07/03/16 0339  WBC 11.6* 7.6  RBC 2.86* 2.35*  HCT 27.5* 22.4*  PLT 238 167    Recent Labs  07/02/16 0434 07/03/16 0339  NA 139 143  K 3.7 3.6  CL 108 114*  CO2 26 26  BUN 15 12  CREATININE 0.62 0.61  GLUCOSE 93 100*  CALCIUM 7.8* 7.7*    Recent Labs  07/01/16 0815  INR 0.92    EXAM General - Patient is Alert, Appropriate and Oriented Extremity - Neurovascular intact Sensation intact distally Intact pulses distally Dorsiflexion/Plantar flexion intact No cellulitis present Compartment soft Dressing - dressing C/D/I and changed this am. Motor Function - intact, moving foot and toes well on exam.   Past Medical History:  Diagnosis Date  . Anemia   . Arthritis    osteoarthritis  . Asthma    remote h/o asthma  . Depression    controlled  . H/O hypoglycemia   . H/O: GI bleed   . Headache(784.0)    h/o migraines  . Hearing loss, bilateral    wears hearing aides  . History of blood transfusion    pt states she had yellow jaundice  . Hypertension   . Lupus (systemic lupus erythematosus) ([place]) 1995   in  remission since 2005  . Nasal sinus congestion   . Osteoporosis with fracture   . Stress fracture of foot    left foot  . 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   Weight as of this encounter: 44 kg (97 lb). Advance diet Up with therapy  Needs BM Acute post op blood loss anemia- transfuse 2 units of PRBC, recheck labs in the am     DVT Prophylaxis - Lovenox, Foot Pumps and TED hose Weight-Bearing as tolerated to right leg   T. Rachelle Hora, PA-C [place] 07/03/2016, 8:13 AM

## 2016-07-03 NOTE — Progress Notes (Signed)
Physical Therapy Treatment Patient Details Name: Renee Cabrera MRN: [id number] DOB: September 13, 1941 Today's Date: 07/03/2016    History of Present Illness Pt is a 75 y.o. female s/p 07/01/16 R IMN exchange of femoral rod secondary to nondisplaced transverse fx of R femur.  Pt reports stepping wrong and has h/o fx's from moving "the wrong way".  PMH includes: "brittle bones" (per pt report), anemia, asthma, B hearing loss (hearing aides), htn, Lupus, stress fx L foot.    PT Comments    Pt agreeable to PT today; pain in right hip improving, but pt notes right knee feeling more sore than yesterday. Pt is to receive 2 units of PRBC today. Pt notes difficulty sitting in recliner yesterday; therefore, transfer out of bed deferred until post transfusion. Pt performs supine bed exercises with assist as needed and provided with written home exercise program. Pt encouraged to perform several times a day. Continue PT to progress strength and activity tolerance to improve all functional mobility and decrease risk of falls.   Follow Up Recommendations  SNF     Equipment Recommendations       Recommendations for Other Services       Precautions / Restrictions Precautions Precautions: Fall Restrictions Weight Bearing Restrictions: Yes RLE Weight Bearing: Weight bearing as tolerated    Mobility  Bed Mobility               General bed mobility comments: Pt worried to be in chair too long today, as it "drove me crazy" yesterday. Pt is to receive 2 units of PRBC today; therefore deferred until after transfusion  Transfers                    Ambulation/Gait                 Stairs            Wheelchair Mobility    Modified Rankin (Stroke Patients Only)       Balance                                    Cognition Arousal/Alertness: Awake/alert Behavior During Therapy: WFL for tasks assessed/performed Overall Cognitive Status: Within Functional  Limits for tasks assessed                      Exercises General Exercises - Lower Extremity Ankle Circles/Pumps: AROM;Both;20 reps;Supine Quad Sets: Strengthening;Both;20 reps;Supine Gluteal Sets: Strengthening;Both;20 reps;Supine Short Arc Quad: AROM;Both;20 reps;Supine;Other (comment) (allow several second pause/stretch in R knee flexion) Heel Slides: AAROM;Right;20 reps;Supine (L AROM) Hip ABduction/ADduction: AAROM;Right;20 reps;Supine (L AROM) Straight Leg Raises: AAROM;Both;10 reps;Supine (2 sets)    General Comments        Pertinent Vitals/Pain Pain Assessment: 0-10 Pain Score: 2  Pain Location: R hip/thigh and knee Pain Descriptors / Indicators: Aching Pain Intervention(s): Monitored during session;Premedicated before session    Home Living                      Prior Function            PT Goals (current goals can now be found in the care plan section) Progress towards PT goals: Progressing toward goals    Frequency  BID    PT Plan Current plan remains appropriate    Co-evaluation             End  of Session   Activity Tolerance: Patient tolerated treatment well;Patient limited by pain (pain in knee moreso than hip with exercises) Patient left: in bed;with call bell/phone within reach;with bed alarm set;with SCD's reapplied     Time: [id number] PT Time Calculation (min) (ACUTE ONLY): 26 min  Charges:  $Therapeutic Exercise: 23-37 mins                    G Codes:      Charlaine Dalton, PTA 07/03/2016, 10:18 AM

## 2016-07-03 NOTE — Progress Notes (Signed)
Enoxaparin   Patient qualifies for Enoxaparin 30 mg SQ daily based on low body weight of [id number] kg per policy. Will change to Enoxaparin 30  mg SQ daily.  Larene Beach, PharmD

## 2016-07-03 NOTE — Progress Notes (Signed)
Checked to see if pre-authorization would be needed for non-emergent EMS transport. Per [employer] benefits obtained online through [employer], patient has a [employer] Medicare Advantage PPO policy.  Medicare PPO plans do not require pre-auth for non-emergent ground transports using service codes A0426 or A0428.   

## 2016-07-04 ENCOUNTER — Encounter
Admission: RE | Admit: 2016-07-04 | Discharge: 2016-07-04 | Disposition: A | Discharge status: Home / Self Care | Payer: Medicare Other | Source: Ambulatory Visit | Attending: Internal Medicine | Admitting: Internal Medicine

## 2016-07-04 DIAGNOSIS — D649 Anemia, unspecified: Secondary | ICD-10-CM | POA: Insufficient documentation

## 2016-07-04 DIAGNOSIS — I951 Orthostatic hypotension: Secondary | ICD-10-CM | POA: Insufficient documentation

## 2016-07-04 LAB — CBC
HCT: 32.6 % — ABNORMAL LOW (ref 35.0–47.0)
Hemoglobin: 11.3 g/dL — ABNORMAL LOW (ref 12.0–16.0)
MCH: 31.6 pg (ref 26.0–34.0)
MCHC: 34.5 g/dL (ref 32.0–36.0)
MCV: 91.6 fL (ref 80.0–100.0)
PLATELETS: 165 10*3/uL (ref 150–440)
RBC: 3.56 MIL/uL — AB (ref 3.80–5.20)
RDW: 15.4 % — AB (ref 11.5–14.5)
WBC: 9.4 10*3/uL (ref 3.6–11.0)

## 2016-07-04 LAB — TYPE AND SCREEN
ABO/RH(D): O POS
ANTIBODY SCREEN: NEGATIVE
UNIT DIVISION: 0
Unit division: 0

## 2016-07-04 MED ORDER — HYDROCODONE-ACETAMINOPHEN 5-325 MG PO TABS: 1.0000 | ORAL_TABLET | ORAL | 0 refills | Status: DC | PRN

## 2016-07-04 NOTE — Progress Notes (Signed)
Security brought patient her belongings- 2 checks, visa card and license.   Deri Fuelling, RN

## 2016-07-04 NOTE — Progress Notes (Signed)
   Subjective: 3 Days Post-Op Procedure(s) (LRB): INTRAMEDULLARY (IM) NAIL EXCHANGE OF FEMORAL ROD (Right) Patient reports pain as mild.   Patient is well, and has had no acute complaints or problems. Denies any CP, SOB, ABD pain. We will start physical therapy today.    Objective: Vital signs in last 24 hours: Temp:  [97.8 F (36.6 C)-99.4 F (37.4 C)] 99 F (37.2 C) (09/08 0758) Pulse Rate:  [78-100] 85 (09/08 0758) Resp:  [14-19] 14 (09/08 0758) BP: (99-166)/(45-91) 145/71 (09/08 0758) SpO2:  [94 %-98 %] 97 % (09/08 0758)  Intake/Output from previous day: 09/07 0701 - 09/08 0700 In: 3726 [P.O.:480; I.V.:2500; Blood:746] Out: 951 [Urine:951] Intake/Output this shift: Total I/O In: 431.7 [P.O.:150; I.V.:281.7] Out: 250 [Urine:250]   Recent Labs  07/01/16 1624 07/02/16 0434 07/03/16 0339 07/03/16 2208 07/04/16 0342  HGB 11.5* 9.4* 7.7* 11.3* 11.3*    Recent Labs  07/03/16 0339 07/04/16 0342  WBC 7.6 9.4  RBC 2.35* 3.56*  HCT 22.4* 32.6*  PLT 167 165    Recent Labs  07/02/16 0434 07/03/16 0339  NA 139 143  K 3.7 3.6  CL 108 114*  CO2 26 26  BUN 15 12  CREATININE 0.62 0.61  GLUCOSE 93 100*  CALCIUM 7.8* 7.7*    Recent Labs  07/01/16 0815  INR 0.92    EXAM General - Patient is Alert, Appropriate and Oriented Extremity - Neurovascular intact Sensation intact distally Intact pulses distally Dorsiflexion/Plantar flexion intact No cellulitis present Compartment soft Dressing - dressing C/D/I and scant drainage Motor Function - intact, moving foot and toes well on exam.   Past Medical History:  Diagnosis Date  . Anemia   . Arthritis    osteoarthritis  . Asthma    remote h/o asthma  . Depression    controlled  . H/O hypoglycemia   . H/O: GI bleed   . Headache(784.0)    h/o migraines  . Hearing loss, bilateral    wears hearing aides  . History of blood transfusion    pt states she had yellow jaundice  . Hypertension   . Lupus  (systemic lupus erythematosus) ([place]) 1995   in remission since 2005  . Nasal sinus congestion   . Osteoporosis with fracture   . Stress fracture of foot    left foot  . 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   Weight as of this encounter: 44 kg (97 lb). Advance diet Up with therapy  Needs BM Acute post op blood loss anemia- improved after 2 units of PRBC.  Discharge to SNF today pending BM  Follow up with [facility] in 2 weeks for staple removal      DVT Prophylaxis - Lovenox, Foot Pumps and TED hose Weight-Bearing as tolerated to right leg   T. Rachelle Hora, PA-C [place] 07/04/2016, 8:09 AM

## 2016-07-04 NOTE — Clinical Social Work Placement (Signed)
   CLINICAL SOCIAL WORK PLACEMENT  NOTE  Date:  07/04/2016  Patient Details  Name: Renee Cabrera MRN: [id number] Date of Birth: 07/19/1941  Clinical Social Work is seeking post-discharge placement for this patient at the [place] level of care (*CSW will initial, date and re-position this form in  chart as items are completed):  Yes   Patient/family provided with [place] Work Department's list of facilities offering this level of care within the geographic area requested by the patient (or if unable, by the patient's family).  Yes   Patient/family informed of their freedom to choose among providers that offer the needed level of care, that participate in Medicare, Medicaid or managed care program needed by the patient, have an available bed and are willing to accept the patient.  Yes   Patient/family informed of [place]'s ownership interest in [facility] and [facility], as well as of the fact that they are under no obligation to receive care at these facilities.  PASRR submitted to EDS on 07/01/16     PASRR number received on 07/01/16     Existing PASRR number confirmed on       FL2 transmitted to all facilities in geographic area requested by pt/family on 07/02/16     FL2 transmitted to all facilities within larger geographic area on       Patient informed that his/her managed care company has contracts with or will negotiate with certain facilities, including the following:        Yes   Patient/family informed of bed offers received.  Patient chooses bed at  [facility] )     Physician recommends and patient chooses bed at      Patient to be transferred to  [facility] ) on 07/04/16.  Patient to be transferred to facility by  [facility] )     Patient family notified on 07/04/16 of transfer.  Name of family member notified:   (Patient has notifed her brother who is out of town. Patient requested for CSW  to not call her family. )     PHYSICIAN       Additional Comment:    _______________________________________________ Joanthan Hlavacek, Veronia Beets, LCSW 07/04/2016, 10:13 AM

## 2016-07-04 NOTE — Progress Notes (Signed)
RN gave report to Control and instrumentation engineer at [place]. VS are stable and all questions were answered. EMS called for transport.   Deri Fuelling, RN

## 2016-07-04 NOTE — Discharge Summary (Signed)
Physician Discharge Summary  Patient ID: Renee Cabrera MRN: [id number] DOB/AGE: 11-12-1940 75 y.o.  Admit date: 07/01/2016 Discharge date: 07/04/2016  Admission Diagnoses:  closed nondisplaced fracture transverse of shaft right femur   Discharge Diagnoses: Patient Active Problem List   Diagnosis Date Noted  . 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 Iron deficiency anemia 05/04/2015  . 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 Anemia   . Arthritis    osteoarthritis  . Asthma    remote h/o asthma  . Depression    controlled  . H/O hypoglycemia   . H/O: GI bleed   . Headache(784.0)    h/o migraines  . Hearing loss, bilateral    wears hearing aides  . History of blood transfusion    pt states she had yellow jaundice  . Hypertension   . Lupus (systemic lupus erythematosus) ([place]) 1995   in remission since 2005  . Nasal sinus congestion   . Osteoporosis with fracture   . Stress fracture of foot    left foot  . Vitamin B 12 deficiency      Transfusion: none   Consultants (if any):   Discharged Condition: Improved  Hospital Course: Renee TRUDO is an 75 y.o. female who was admitted 07/01/2016 with a diagnosis of right femoral shaft non-union and went to the operating room on 07/01/2016 and underwent the above named procedures.    Surgeries: Procedure(s): INTRAMEDULLARY (IM) NAIL EXCHANGE OF FEMORAL ROD on 07/01/2016 Patient tolerated the surgery well. Taken to PACU where she was stabilized and then transferred to the orthopedic floor.  Started on Lovenox 40 q 24 hrs. Foot pumps applied bilaterally at 80 mm. Heels elevated on bed with rolled towels. No evidence of DVT. Negative [place]oman. Physical therapy started on day #1 for gait training and transfer. OT started day #1 for ADL and assisted devices.  Patient's foley was d/c on day #1. Patient's IV was  d/c on day #2.on post op day #2 hgb fell to 7.7, patient was given 2 units of PRBC, Hgb up to 11.3.  On post op day #3 patient was stable and ready for discharge to SNF.  Implants: 11 x 3 40 Biomet affixes rod with 85 mm lag screw and 38 mm distal interlocking screw  She was given perioperative antibiotics:  Anti-infectives    Start     Dose/Rate Route Frequency Ordered Stop   07/01/16 1700  ceFAZolin (ANCEF) IVPB 1 g/50 mL premix     1 g 100 mL/hr over 30 Minutes Intravenous Every 6 hours 07/01/16 1407 07/02/16 0617   07/01/16 0930  ceFAZolin (ANCEF) 1,000 mg in dextrose 5 % 100 mL IVPB     1,000 mg 220 mL/hr over 30 Minutes Intravenous  Once 07/01/16 0925 07/01/16 1047   07/01/16 0831  ceFAZolin (ANCEF) 1 GM/50ML IVPB    Comments:  Rexanne Mano: cabinet override      07/01/16 0831 07/01/16 2044    .  She was given sequential compression devices, early ambulation, and aspirin for DVT prophylaxis.  She benefited maximally from the hospital stay and there were no complications.    Recent vital signs:  Vitals:   07/04/16 0426 07/04/16 0758  BP: (!) 166/91 (!) 145/71  Pulse: 90 85  Resp: 18 14  Temp: 98.2 F (36.8 C) 99 F (37.2 C)    Recent laboratory studies:  Lab Results  Component Value Date   HGB 11.3 (L) 07/04/2016   HGB 11.3 (L) 07/03/2016   HGB 7.7 (L) 07/03/2016   Lab Results  Component Value Date   WBC 9.4 07/04/2016   PLT 165 07/04/2016   Lab Results  Component Value Date   INR 0.92 07/01/2016   Lab Results  Component Value Date   NA 143 07/03/2016   K 3.6 07/03/2016   CL 114 (H) 07/03/2016   CO2 26 07/03/2016   BUN 12 07/03/2016   CREATININE 0.61 07/03/2016   GLUCOSE 100 (H) 07/03/2016    Discharge Medications:     Medication List    STOP taking these medications   meloxicam 7.5 MG tablet Commonly known as:  MOBIC     TAKE these medications   aspirin 81 MG tablet Take 81 mg by mouth daily.   CALTRATE 600 PO Take 1 tablet by  mouth 2 (two) times daily.   cholecalciferol 1000 units tablet Commonly known as:  VITAMIN D Take 1,000 Units by mouth daily.   citalopram 20 MG tablet Commonly known as:  CELEXA Take 20 mg by mouth daily.   cyanocobalamin 1000 MCG/ML injection Commonly known as:  (VITAMIN B-12) Inject into the muscle.   EXCEDRIN MIGRAINE PO Take 1 tablet by mouth daily as needed. For migraines   FORTEO Hazel Inject into the skin every morning.   gabapentin 100 MG capsule Commonly known as:  NEURONTIN   HEMATINIC PLUS VIT/MINERALS 106-1 MG Tabs Take 1 tablet by mouth daily.   HYDROcodone-acetaminophen 5-325 MG tablet Commonly known as:  NORCO/VICODIN Take 1 tablet by mouth every 4 (four) hours as needed for moderate pain. What changed:  Another medication with the same name was added. Make sure you understand how and when to take each.   HYDROcodone-acetaminophen 5-325 MG tablet Commonly known as:  NORCO/VICODIN Take 1 tablet by mouth every 4 (four) hours as needed for moderate pain. What changed:  You were already taking a medication with the same name, and this prescription was added. Make sure you understand how and when to take each.   lisinopril 2.5 MG tablet Commonly known as:  PRINIVIL,ZESTRIL Take 2.5 mg by mouth daily.   mirtazapine 30 MG tablet Commonly known as:  REMERON Take 30 mg by mouth at bedtime.   pantoprazole 40 MG tablet Commonly known as:  PROTONIX TAKE 1 TABLET BY MOUTH DAILY 1 HOUR BEFORE MEALS   PRESERVISION AREDS Tabs Take 2 tablets by mouth daily.   PROAIR HFA 108 (90 Base) MCG/ACT inhaler Generic drug:  albuterol Inhale 2 puffs into the lungs every 6 (six) hours as needed.   travoprost (benzalkonium) 0.004 % ophthalmic solution Commonly known as:  TRAVATAN Place 1 drop into both eyes at bedtime.   vitamin C 1000 MG tablet Take 500 mg by mouth 2 (two) times daily.   zolpidem 5 MG tablet Commonly known as:  AMBIEN Take 5 mg by mouth at bedtime as  needed.       Diagnostic Studies: Dg C-arm 1-60 Min  Result Date: 07/01/2016 CLINICAL DATA:  ORIF EXAM: RIGHT FEMUR 2 VIEWS; DG C-ARM 61-120 MIN COMPARISON:  Right femur radiographs 02/25/2016. FINDINGS: A single intraoperative AP view of the right hip demonstrates an intra medullary rod. The femoral neck screw has been revised. The distal aspect of the IM ride is not imaged. IMPRESSION: Revision right hip ORIF without radiographic evidence for complication. Electronically Signed   By: San Morelle M.D.   On: 07/01/2016  12:47   Dg Femur, Min 2 Views Right  Result Date: 07/01/2016 CLINICAL DATA:  ORIF EXAM: RIGHT FEMUR 2 VIEWS; DG C-ARM 61-120 MIN COMPARISON:  Right femur radiographs 02/25/2016. FINDINGS: A single intraoperative AP view of the right hip demonstrates an intra medullary rod. The femoral neck screw has been revised. The distal aspect of the IM ride is not imaged. IMPRESSION: Revision right hip ORIF without radiographic evidence for complication. Electronically Signed   By: San Morelle M.D.   On: 07/01/2016 12:47    Disposition: 01-Home or Self Care     Contact information for follow-up providers    MENZ,MICHAEL, MD Follow up in 2 week(s).   Specialty:  Orthopedic Surgery Contact information: [place] [address] [phone]            Contact information for after-discharge care    Destination    [facility] .   Specialty:  [place] information: [place] [phone]                   Signed: Feliberto Gottron 07/04/2016, 8:16 AM

## 2016-07-04 NOTE — Progress Notes (Signed)
Physical Therapy Treatment Patient Details Name: Renee Cabrera MRN: [id number] DOB: 1941/04/18 Today's Date: 07/04/2016    History of Present Illness Pt. is a 75 y.o. female who was admitted for an IMN femoral rod exchange secondary to a nondisplaced transverse Fracture of the right Femur.    PT Comments    Pt did much better today than any previous PT session.  She was very weak yesterday but has much more energy today post transfusions and generally does well with all PT tasks.  She is able to do most exercises against some resistance, shows good WBing tolerance and very increased ambulation confidence.  Pt making good gains.   Follow Up Recommendations        Equipment Recommendations       Recommendations for Other Services       Precautions / Restrictions Precautions Precautions: Fall Restrictions Weight Bearing Restrictions: Yes RLE Weight Bearing: Weight bearing as tolerated    Mobility  Bed Mobility Overal bed mobility: Modified Independent Bed Mobility: Supine to Sit     Supine to sit: Supervision     General bed mobility comments: Pt did well with getting to EOB w/o direct assist, she states "I couldn't even move my leg yesterday"  Transfers Overall transfer level: Modified independent Equipment used: Rolling walker (2 wheeled) Transfers: Sit to/from Stand Sit to Stand: Min guard         General transfer comment: Pt is not overly reliant on the UEs to get to standing, she did well and was able to rise w/o direct assist  Ambulation/Gait Ambulation/Gait assistance: Min guard Ambulation Distance (Feet): 125 Feet Assistive device: Rolling walker (2 wheeled)       General Gait Details: Pt walking with much more confidence and ability today, she has only minimal hesitation on the the R and is not reliant on the walker   Stairs            Wheelchair Mobility    Modified Rankin (Stroke Patients Only)       Balance                                    Cognition Arousal/Alertness: Awake/alert Behavior During Therapy: WFL for tasks assessed/performed Overall Cognitive Status: Within Functional Limits for tasks assessed                      Exercises General Exercises - Lower Extremity Ankle Circles/Pumps: Strengthening;10 reps Quad Sets: Strengthening;10 reps Gluteal Sets: Strengthening;10 reps Short Arc Quad: 15 reps;Strengthening Heel Slides: Strengthening;AROM;10 reps Hip ABduction/ADduction: Strengthening;10 reps Straight Leg Raises: Strengthening;10 reps    General Comments        Pertinent Vitals/Pain Pain Assessment: 0-10 Pain Score: 3     Home Living                      Prior Function            PT Goals (current goals can now be found in the care plan section) Progress towards PT goals: Progressing toward goals    Frequency  BID    PT Plan Current plan remains appropriate    Co-evaluation             End of Session Equipment Utilized During Treatment: Gait belt Activity Tolerance: Patient limited by fatigue;Other (comment) Patient left: in chair     Time: [phone] PT  Time Calculation (min) (ACUTE ONLY): 29 min  Charges:  $Gait Training: 8-22 mins $Therapeutic Exercise: 8-22 mins                    G Codes:      Kreg Shropshire, DPT 07/04/2016, 10:46 AM

## 2016-07-04 NOTE — Progress Notes (Signed)
Patient is medically stable for D/C to [facility] today. Per Kim admissions coordinator at [facility] patient will go to room 213. RN will call report at [phone] and arrange EMS for transport. Clinical Education officer, museum (CSW) sent D/C orders to [employer] via [employer]. Patient is aware of above. Patient reported that her family is aware of D/C today and asked CSW to not call her family. Please reconsult if future social work needs arise. CSW signing off.   [employer], LCSW [phone]

## 2016-07-04 NOTE — Discharge Instructions (Signed)

## 2016-07-08 ENCOUNTER — Non-Acute Institutional Stay (SKILLED_NURSING_FACILITY): Payer: Medicare Other | Admitting: Gerontology

## 2016-07-08 DIAGNOSIS — I951 Orthostatic hypotension: Secondary | ICD-10-CM | POA: Diagnosis not present

## 2016-07-08 DIAGNOSIS — E559 Vitamin D deficiency, unspecified: Secondary | ICD-10-CM | POA: Diagnosis not present

## 2016-07-08 DIAGNOSIS — D649 Anemia, unspecified: Secondary | ICD-10-CM | POA: Diagnosis not present

## 2016-07-08 DIAGNOSIS — L03115 Cellulitis of right lower limb: Secondary | ICD-10-CM

## 2016-07-08 LAB — COMPREHENSIVE METABOLIC PANEL
ALK PHOS: 60 U/L (ref 38–126)
ALT: 21 U/L (ref 14–54)
ANION GAP: 8 (ref 5–15)
AST: 28 U/L (ref 15–41)
Albumin: 3.6 g/dL (ref 3.5–5.0)
BUN: 13 mg/dL (ref 6–20)
CALCIUM: 9.2 mg/dL (ref 8.9–10.3)
CO2: 29 mmol/L (ref 22–32)
Chloride: 100 mmol/L — ABNORMAL LOW (ref 101–111)
Creatinine, Ser: 0.6 mg/dL (ref 0.44–1.00)
GFR calc non Af Amer: >60 mL/min (ref >60)
Glucose, Bld: 90 mg/dL (ref 65–99)
Potassium: 3.7 mmol/L (ref 3.5–5.1)
SODIUM: 137 mmol/L (ref 135–145)
TOTAL PROTEIN: 6.6 g/dL (ref 6.5–8.1)
Total Bilirubin: 1.2 mg/dL (ref 0.3–1.2)

## 2016-07-08 LAB — CBC WITH DIFFERENTIAL/PLATELET
Basophils Absolute: 0.1 10*3/uL (ref 0–0.1)
Basophils Relative: 0 %
EOS ABS: 0.7 10*3/uL (ref 0–0.7)
EOS PCT: 6 %
HCT: 37.3 % (ref 35.0–47.0)
HEMOGLOBIN: 12.6 g/dL (ref 12.0–16.0)
LYMPHS ABS: 2 10*3/uL (ref 1.0–3.6)
Lymphocytes Relative: 16 %
MCH: 31.2 pg (ref 26.0–34.0)
MCHC: 33.8 g/dL (ref 32.0–36.0)
MCV: 92.2 fL (ref 80.0–100.0)
MONO ABS: 1.3 10*3/uL — AB (ref 0.2–0.9)
MONOS PCT: 11 %
NEUTROS PCT: 67 %
Neutro Abs: 8 10*3/uL — ABNORMAL HIGH (ref 1.4–6.5)
Platelets: 405 10*3/uL (ref 150–440)
RBC: 4.04 MIL/uL (ref 3.80–5.20)
RDW: 15.4 % — ABNORMAL HIGH (ref 11.5–14.5)
WBC: 12.1 10*3/uL — ABNORMAL HIGH (ref 3.6–11.0)

## 2016-07-08 LAB — VITAMIN B12: VITAMIN B 12: 771 pg/mL (ref 180–914)

## 2016-07-08 LAB — MAGNESIUM: Magnesium: 1.8 mg/dL (ref 1.7–2.4)

## 2016-07-09 ENCOUNTER — Non-Acute Institutional Stay (SKILLED_NURSING_FACILITY): Payer: Medicare Other | Admitting: Gerontology

## 2016-07-09 DIAGNOSIS — I950 Idiopathic hypotension: Secondary | ICD-10-CM | POA: Diagnosis not present

## 2016-07-09 DIAGNOSIS — L740 Miliaria rubra: Secondary | ICD-10-CM | POA: Diagnosis not present

## 2016-07-09 LAB — VITAMIN D 25 HYDROXY (VIT D DEFICIENCY, FRACTURES): VIT D 25 HYDROXY: 25.2 ng/mL — AB (ref 30.0–100.0)

## 2016-07-09 NOTE — Progress Notes (Signed)
Location:      Place of Service:  SNF (31) Provider:  Toni Arthurs, NP-C  [name], MD  Patient Care Team: [name], MD as PCP - General [facility] Medicine)  Extended Emergency Contact Information Primary Emergency Contact: [facility] Address: [address]          [place], [place] [address] Johnnette Litter of [place] Phone: [phone] Relation: Other Secondary Emergency Contact: HORNE,ROBERT F Address: [address]          [place], [place] [address] Home Phone: [phone] Relation: None  Code Status:  full Goals of care: Advanced Directive information Advanced Directives 07/02/2016  Does patient have an advance directive? No  Type of Advance Directive -  Does patient want to make changes to advanced directive? -  Would patient like information on creating an advanced directive? -  Pre-existing out of facility DNR order (yellow form or pink MOST form) -     Chief Complaint  Patient presents with  . Follow-up    HPI:  Pt is a 75 y.o. female seen today for an acute visit for Complaint of itchy rash on back. Rash is described as red with slight pustular appearance. Some scabbing from scratching. Rash started 2 days ago. Patient has tried when necessary Benadryl with relief of symptoms. Also, patient seen for follow-up from orthostatic hypotension addressed yesterday. Medication modifications made. Patient was educated on slow position changes for compensation. Orthostatic vitals obtained today and are stable. Patient denies any further feeling of syncope or dizziness. Patient reports she is feeling well today. Vital signs stable. No complaints.   Past Medical History:  Diagnosis Date  . Anemia   . Arthritis    osteoarthritis  . Asthma    remote h/o asthma  . Depression    controlled  . H/O hypoglycemia   . H/O: GI bleed   . Headache(784.0)    h/o migraines  . Hearing loss, bilateral    wears hearing aides  . History of blood transfusion    pt  states she had yellow jaundice  . Hypertension   . Lupus (systemic lupus erythematosus) ([place]) 1995   in remission since 2005  . Nasal sinus congestion   . Osteoporosis with fracture   . Stress fracture of foot    left foot  . Vitamin B 12 deficiency    Past Surgical History:  Procedure Laterality Date  . ANTERIOR CERVICAL DECOMP/DISCECTOMY FUSION  2009  . INTRAMEDULLARY (IM) NAIL INTERTROCHANTERIC Right 07/01/2016   Procedure: INTRAMEDULLARY (IM) NAIL EXCHANGE OF FEMORAL ROD;  Surgeon: Hessie Knows, MD;  Location: [facility] ORS;  Service: Orthopedics;  Laterality: Right;  . ORIF FEMUR FRACTURE  2012  . PARTIAL GASTRECTOMY  1963   and reconstruction 1973  . TONSILLECTOMY  1945    Allergies  Allergen Reactions  . Sulfa Antibiotics Rash      Medication List       Accurate as of 07/09/16  7:05 PM. Always use your most recent med list.          aspirin 81 MG tablet Take 81 mg by mouth daily.   CALTRATE 600 PO Take 1 tablet by mouth 2 (two) times daily.   cholecalciferol 1000 units tablet Commonly known as:  VITAMIN D Take 1,000 Units by mouth daily.   citalopram 20 MG tablet Commonly known as:  CELEXA Take 20 mg by mouth daily.   cyanocobalamin 1000 MCG/ML injection Commonly known as:  (VITAMIN B-12) Inject into the muscle.  EXCEDRIN MIGRAINE PO Take 1 tablet by mouth daily as needed. For migraines   FORTEO [place] Inject into the skin every morning.   gabapentin 100 MG capsule Commonly known as:  NEURONTIN   HEMATINIC PLUS VIT/MINERALS 106-1 MG Tabs Take 1 tablet by mouth daily.   HYDROcodone-acetaminophen 5-325 MG tablet Commonly known as:  NORCO/VICODIN Take 1 tablet by mouth every 4 (four) hours as needed for moderate pain.   HYDROcodone-acetaminophen 5-325 MG tablet Commonly known as:  NORCO/VICODIN Take 1 tablet by mouth every 4 (four) hours as needed for moderate pain.   lisinopril 2.5 MG tablet Commonly known as:  PRINIVIL,ZESTRIL Take 2.5 mg by mouth  daily.   mirtazapine 30 MG tablet Commonly known as:  REMERON Take 30 mg by mouth at bedtime.   pantoprazole 40 MG tablet Commonly known as:  PROTONIX TAKE 1 TABLET BY MOUTH DAILY 1 HOUR BEFORE MEALS   PRESERVISION AREDS Tabs Take 2 tablets by mouth daily.   PROAIR HFA 108 (90 Base) MCG/ACT inhaler Generic drug:  albuterol Inhale 2 puffs into the lungs every 6 (six) hours as needed.   travoprost (benzalkonium) 0.004 % ophthalmic solution Commonly known as:  TRAVATAN Place 1 drop into both eyes at bedtime.   vitamin C 1000 MG tablet Take 500 mg by mouth 2 (two) times daily.   zolpidem 5 MG tablet Commonly known as:  AMBIEN Take 5 mg by mouth at bedtime as needed.       Review of Systems  Constitutional: Negative for activity change, appetite change, chills, diaphoresis and fever.  HENT: Negative.   Eyes: Negative.   Respiratory: Negative for apnea, cough, choking, chest tightness, shortness of breath and wheezing.   Cardiovascular: Negative for chest pain, palpitations and leg swelling.  Gastrointestinal: Negative.   Genitourinary: Negative.   Musculoskeletal: Negative for back pain, gait problem and myalgias. Arthralgias: typical arthritis.  Skin: Positive for rash. Negative for color change, pallor and wound.  Neurological: Negative for dizziness, tremors, syncope, speech difficulty, weakness, light-headedness, numbness and headaches.  Psychiatric/Behavioral: Negative.   All other systems reviewed and are negative.    There is no immunization history on file for this patient. Pertinent  Health Maintenance Due  Topic Date Due  . DEXA SCAN  10/22/2004  . PNA vac Low Risk Adult (1 of 2 - PCV13) 10/22/2004  . INFLUENZA VACCINE  05/27/2016   No flowsheet data found. Functional Status Survey:    There were no vitals filed for this visit. There is no height or weight on file to calculate BMI. Physical Exam  Constitutional: She is oriented to person, place, and  time. Vital signs are normal. She appears well-developed and well-nourished. She is active and cooperative. She does not appear ill. No distress.  HENT:  Head: Normocephalic and atraumatic.  Mouth/Throat: Uvula is midline, oropharynx is clear and moist and mucous membranes are normal. Mucous membranes are not pale, not dry and not cyanotic.  Eyes: Conjunctivae, EOM and lids are normal. Pupils are equal, round, and reactive to light.  Neck: Trachea normal, normal range of motion and full passive range of motion without pain. Neck supple. No JVD present. No tracheal deviation, no edema and no erythema present. No thyromegaly present.  Cardiovascular: Normal rate, regular rhythm, intact distal pulses and normal pulses.  Exam reveals no gallop, no distant heart sounds and no friction rub.   Murmur heard. Pulmonary/Chest: Effort normal and breath sounds normal. No accessory muscle usage. No respiratory distress. She has no  decreased breath sounds. She has no wheezes. She has no rhonchi. She has no rales. She exhibits no tenderness.  Abdominal: Normal appearance and bowel sounds are normal. She exhibits no distension and no ascites. There is no tenderness.  Musculoskeletal: She exhibits no edema or tenderness.       Right hip: She exhibits decreased range of motion (Right Total Hip Revision) and decreased strength.  Expected osteoarthritis, stiffness  Neurological: She is alert and oriented to person, place, and time. She has normal strength.  Skin: Skin is warm and dry. Laceration (right hip incision) and rash noted. Rash is pustular (slightly). She is not diaphoretic. No cyanosis. No pallor. Nails show no clubbing.     Psychiatric: She has a normal mood and affect. Her speech is normal and behavior is normal. Judgment and thought content normal. Cognition and memory are normal.  Nursing note and vitals reviewed.   Labs reviewed:  Recent Labs  07/02/16 0434 07/03/16 0339 07/08/16 0737  NA 139  143 137  K 3.7 3.6 3.7  CL 108 114* 100*  CO2 26 26 29   GLUCOSE 93 100* 90  BUN 15 12 13   CREATININE 0.62 0.61 0.60  CALCIUM 7.8* 7.7* 9.2  MG  --   --  1.8    Recent Labs  07/08/16 0737  AST 28  ALT 21  ALKPHOS 60  BILITOT 1.2  PROT 6.6  ALBUMIN 3.6    Recent Labs  12/14/15 1452 04/04/16 1400  07/03/16 0339 07/03/16 2208 07/04/16 0342 07/08/16 0737  WBC 8.7 8.7  < > 7.6  --  9.4 12.1*  NEUTROABS 5.7 5.7  --   --   --   --  8.0*  HGB 14.4 14.1  < > 7.7* 11.3* 11.3* 12.6  HCT 42.7 41.6  < > 22.4*  --  32.6* 37.3  MCV 95.6 95.5  < > 95.5  --  91.6 92.2  PLT 295 320  < > 167  --  165 405  < > = values in this interval not displayed. No results found for: TSH No results found for: HGBA1C No results found for: CHOL, HDL, LDLCALC, LDLDIRECT, TRIG, CHOLHDL  Significant Diagnostic Results in last 30 days:  Dg C-arm 1-60 Min  Result Date: 07/01/2016 CLINICAL DATA:  ORIF EXAM: RIGHT FEMUR 2 VIEWS; DG C-ARM 61-120 MIN COMPARISON:  Right femur radiographs 02/25/2016. FINDINGS: A single intraoperative AP view of the right hip demonstrates an intra medullary rod. The femoral neck screw has been revised. The distal aspect of the IM ride is not imaged. IMPRESSION: Revision right hip ORIF without radiographic evidence for complication. Electronically Signed   By: San Morelle M.D.   On: 07/01/2016 12:47   Dg Femur, Min 2 Views Right  Result Date: 07/01/2016 CLINICAL DATA:  ORIF EXAM: RIGHT FEMUR 2 VIEWS; DG C-ARM 61-120 MIN COMPARISON:  Right femur radiographs 02/25/2016. FINDINGS: A single intraoperative AP view of the right hip demonstrates an intra medullary rod. The femoral neck screw has been revised. The distal aspect of the IM ride is not imaged. IMPRESSION: Revision right hip ORIF without radiographic evidence for complication. Electronically Signed   By: San Morelle M.D.   On: 07/01/2016 12:47    Assessment/Plan 1. Idiopathic hypotension  DC lisinopril 2.5  mg tablet  Encourage by mouth fluid intake  2. Prickly heat  Triamcinolone 0.1% cream apply thin film on to back rash twice a day 7 days  Keep skin on back clean and dry  Moisture barrier between skin and mattress  Family/ staff Communication:   Total Time:  Documentation:  Face to Face:  Family/Phone:   Labs/tests ordered:   Medication list reviewed and assessed for continued appropriateness.  Vikki Ports, NP-C Geriatrics [facility] [phone] [place], [place] [address] Cell Phone (Mon-Fri 8am-5pm):  [phone] On Call:  [phone] & follow prompts after 5pm & weekends Office Phone:  [phone] Office Fax:  [phone]

## 2016-07-12 DIAGNOSIS — E559 Vitamin D deficiency, unspecified: Secondary | ICD-10-CM | POA: Insufficient documentation

## 2016-07-12 NOTE — Progress Notes (Signed)
Location:      Place of Service:  SNF (31) Provider:  Toni Arthurs, NP-C  [name], MD  Patient Care Team: [name], MD as PCP - General [facility] Medicine)  Extended Emergency Contact Information Primary Emergency Contact: [facility] Address: [address]          [place], [place] [address] Johnnette Litter of [place] Phone: [phone] Relation: Other Secondary Emergency Contact: HORNE,ROBERT F Address: [address]          [place], [place] [address] Home Phone: [phone] Relation: None  Code Status:  Full Goals of care: Advanced Directive information Advanced Directives 07/02/2016  Does patient have an advance directive? No  Type of Advance Directive -  Does patient want to make changes to advanced directive? -  Would patient like information on creating an advanced directive? -  Pre-existing out of facility DNR order (yellow form or pink MOST form) -     Chief Complaint  Patient presents with  . Acute Visit    HPI:  Pt is a 75 y.o. female seen today for an acute visit for Reports from physical and occupational therapy of orthostatic hypotension. Also, to follow-up from diagnosis of cellulitis of the right hip. Patient was seen yesterday by Dr. Ouida Sills and put on Levaquin. Patient denies any excess pain or decreased range of motion to the right hip. No drainage from incision site. Redness and warmth appear to have diminished. Patient reports that while working with therapy today, she was standing at the sink brushing her teeth. Without warning, she felt as if "everything just drained out of her." She denies loss of consciousness/syncope. However, she had to sit down very quickly to avoid syncope. Patient denies having BM before or after the event. Patient denies dizziness, headache, alterations in vision. Patient reports pain is a minimum and being well controlled by pain meds. No excess usage of pain medications. No other complaints. Aside from blood  pressure, other vital signs stable.   Past Medical History:  Diagnosis Date  . Anemia   . Arthritis    osteoarthritis  . Asthma    remote h/o asthma  . Depression    controlled  . H/O hypoglycemia   . H/O: GI bleed   . Headache(784.0)    h/o migraines  . Hearing loss, bilateral    wears hearing aides  . History of blood transfusion    pt states she had yellow jaundice  . Hypertension   . Lupus (systemic lupus erythematosus) ([place]) 1995   in remission since 2005  . Nasal sinus congestion   . Osteoporosis with fracture   . Stress fracture of foot    left foot  . Vitamin B 12 deficiency    Past Surgical History:  Procedure Laterality Date  . ANTERIOR CERVICAL DECOMP/DISCECTOMY FUSION  2009  . INTRAMEDULLARY (IM) NAIL INTERTROCHANTERIC Right 07/01/2016   Procedure: INTRAMEDULLARY (IM) NAIL EXCHANGE OF FEMORAL ROD;  Surgeon: Hessie Knows, MD;  Location: [facility] ORS;  Service: Orthopedics;  Laterality: Right;  . ORIF FEMUR FRACTURE  2012  . PARTIAL GASTRECTOMY  1963   and reconstruction 1973  . TONSILLECTOMY  1945    Allergies  Allergen Reactions  . Sulfa Antibiotics Rash      Medication List       Accurate as of 07/08/16 11:59 PM. Always use your most recent med list.          aspirin 81 MG tablet Take 81 mg by mouth daily.   CALTRATE  600 PO Take 1 tablet by mouth 2 (two) times daily.   cholecalciferol 1000 units tablet Commonly known as:  VITAMIN D Take 1,000 Units by mouth daily.   citalopram 20 MG tablet Commonly known as:  CELEXA Take 20 mg by mouth daily.   cyanocobalamin 1000 MCG/ML injection Commonly known as:  (VITAMIN B-12) Inject into the muscle.   EXCEDRIN MIGRAINE PO Take 1 tablet by mouth daily as needed. For migraines   FORTEO [place] Inject into the skin every morning.   gabapentin 100 MG capsule Commonly known as:  NEURONTIN   HEMATINIC PLUS VIT/MINERALS 106-1 MG Tabs Take 1 tablet by mouth daily.   HYDROcodone-acetaminophen 5-325 MG  tablet Commonly known as:  NORCO/VICODIN Take 1 tablet by mouth every 4 (four) hours as needed for moderate pain.   HYDROcodone-acetaminophen 5-325 MG tablet Commonly known as:  NORCO/VICODIN Take 1 tablet by mouth every 4 (four) hours as needed for moderate pain.   lisinopril 2.5 MG tablet Commonly known as:  PRINIVIL,ZESTRIL Take 2.5 mg by mouth daily.   mirtazapine 30 MG tablet Commonly known as:  REMERON Take 30 mg by mouth at bedtime.   pantoprazole 40 MG tablet Commonly known as:  PROTONIX TAKE 1 TABLET BY MOUTH DAILY 1 HOUR BEFORE MEALS   PRESERVISION AREDS Tabs Take 2 tablets by mouth daily.   PROAIR HFA 108 (90 Base) MCG/ACT inhaler Generic drug:  albuterol Inhale 2 puffs into the lungs every 6 (six) hours as needed.   travoprost (benzalkonium) 0.004 % ophthalmic solution Commonly known as:  TRAVATAN Place 1 drop into both eyes at bedtime.   vitamin C 1000 MG tablet Take 500 mg by mouth 2 (two) times daily.   zolpidem 5 MG tablet Commonly known as:  AMBIEN Take 5 mg by mouth at bedtime as needed.       Review of Systems  Constitutional: Negative for activity change, appetite change, chills, diaphoresis and fever.  HENT: Negative for congestion, sneezing, sore throat, trouble swallowing and voice change.   Respiratory: Negative for apnea, cough, choking, chest tightness, shortness of breath and wheezing.   Cardiovascular: Negative for chest pain, palpitations and leg swelling.  Gastrointestinal: Negative for abdominal distention, abdominal pain, constipation, diarrhea and nausea.  Genitourinary: Negative for difficulty urinating, dysuria, frequency and urgency.  Musculoskeletal: Positive for arthralgias (typical arthritis), gait problem and joint swelling. Negative for back pain and myalgias.  Skin: Positive for color change and wound. Negative for pallor and rash.  Neurological: Negative for dizziness, tremors, syncope, speech difficulty, weakness,  light-headedness, numbness and headaches.  Psychiatric/Behavioral: Negative for agitation and behavioral problems.  All other systems reviewed and are negative.    There is no immunization history on file for this patient. Pertinent  Health Maintenance Due  Topic Date Due  . DEXA SCAN  10/22/2004  . PNA vac Low Risk Adult (1 of 2 - PCV13) 10/22/2004  . INFLUENZA VACCINE  05/27/2016   No flowsheet data found. Functional Status Survey:    Vitals:   07/08/16 0500  BP: (!) 110/53  Pulse: 74  Resp: (!) 22  Temp: 97.8 F (36.6 C)  SpO2: 98%  Weight: 97 lb 1.6 oz (44 kg)   Body mass index is 18.96 kg/m. Physical Exam  Constitutional: She is oriented to person, place, and time. Vital signs are normal. She appears well-developed and well-nourished. She is active and cooperative. She does not appear ill. No distress.  HENT:  Head: Normocephalic and atraumatic.  Mouth/Throat: Uvula is  midline, oropharynx is clear and moist and mucous membranes are normal. Mucous membranes are not pale, not dry and not cyanotic.  Eyes: Conjunctivae, EOM and lids are normal. Pupils are equal, round, and reactive to light.  Neck: Trachea normal, normal range of motion and full passive range of motion without pain. Neck supple. No JVD present. No tracheal deviation, no edema and no erythema present. No thyromegaly present.  Cardiovascular: Normal rate, regular rhythm, normal heart sounds, intact distal pulses and normal pulses.  Exam reveals no gallop, no distant heart sounds and no friction rub.   No murmur heard. Pulmonary/Chest: Effort normal and breath sounds normal. No accessory muscle usage. No respiratory distress. She has no wheezes. She has no rales. She exhibits no tenderness.  Abdominal: Normal appearance and bowel sounds are normal. She exhibits no distension and no ascites. There is no tenderness.  Musculoskeletal: Normal range of motion. She exhibits no edema or tenderness.  Expected  osteoarthritis, stiffness  Neurological: She is alert and oriented to person, place, and time. She has normal strength.  Skin: Skin is warm, dry and intact. No rash noted. She is not diaphoretic. There is erythema. No cyanosis. No pallor. Nails show no clubbing.     Psychiatric: She has a normal mood and affect. Her speech is normal and behavior is normal. Judgment and thought content normal. Cognition and memory are normal.  Nursing note and vitals reviewed.   Labs reviewed:  Recent Labs  07/02/16 0434 07/03/16 0339 07/08/16 0737  NA 139 143 137  K 3.7 3.6 3.7  CL 108 114* 100*  CO2 26 26 29   GLUCOSE 93 100* 90  BUN 15 12 13   CREATININE 0.62 0.61 0.60  CALCIUM 7.8* 7.7* 9.2  MG  --   --  1.8    Recent Labs  07/08/16 0737  AST 28  ALT 21  ALKPHOS 60  BILITOT 1.2  PROT 6.6  ALBUMIN 3.6    Recent Labs  12/14/15 1452 04/04/16 1400  07/03/16 0339 07/03/16 2208 07/04/16 0342 07/08/16 0737  WBC 8.7 8.7  < > 7.6  --  9.4 12.1*  NEUTROABS 5.7 5.7  --   --   --   --  8.0*  HGB 14.4 14.1  < > 7.7* 11.3* 11.3* 12.6  HCT 42.7 41.6  < > 22.4*  --  32.6* 37.3  MCV 95.6 95.5  < > 95.5  --  91.6 92.2  PLT 295 320  < > 167  --  165 405  < > = values in this interval not displayed. No results found for: TSH No results found for: HGBA1C No results found for: CHOL, HDL, LDLCALC, LDLDIRECT, TRIG, CHOLHDL  Significant Diagnostic Results in last 30 days:  Dg C-arm 1-60 Min  Result Date: 07/01/2016 CLINICAL DATA:  ORIF EXAM: RIGHT FEMUR 2 VIEWS; DG C-ARM 61-120 MIN COMPARISON:  Right femur radiographs 02/25/2016. FINDINGS: A single intraoperative AP view of the right hip demonstrates an intra medullary rod. The femoral neck screw has been revised. The distal aspect of the IM ride is not imaged. IMPRESSION: Revision right hip ORIF without radiographic evidence for complication. Electronically Signed   By: San Morelle M.D.   On: 07/01/2016 12:47   Dg Femur, Min 2 Views  Right  Result Date: 07/01/2016 CLINICAL DATA:  ORIF EXAM: RIGHT FEMUR 2 VIEWS; DG C-ARM 61-120 MIN COMPARISON:  Right femur radiographs 02/25/2016. FINDINGS: A single intraoperative AP view of the right hip demonstrates an intra medullary  rod. The femoral neck screw has been revised. The distal aspect of the IM ride is not imaged. IMPRESSION: Revision right hip ORIF without radiographic evidence for complication. Electronically Signed   By: San Morelle M.D.   On: 07/01/2016 12:47    Assessment/Plan 1. Vitamin D deficiency  Increase cholecalciferol from 1000 units daily to 2000 units daily  2. Orthostatic hypotension  DC lisinopril 2.5 mg  Encourage by mouth fluid intake  Educated patient on slow position changes  3. Cellulitis of hip, right  Continue Levaquin 500 milligrams by mouth once daily 7  days as ordered yesterday by Dr. Ouida Sills  Family/ staff Communication:   Total Time:  Documentation:  Face to Face:  Family/Phone:   Labs/tests ordered:  CBC, met C, mag, vitamin B12, vitamin D,  Medication list reviewed and assessed for continued appropriateness.  Vikki Ports, NP-C Geriatrics [facility] [phone] [place], [place] [address] Cell Phone (Mon-Fri 8am-5pm):  [phone] On Call:  [phone] & follow prompts after 5pm & weekends Office Phone:  [phone] Office Fax:  [phone]

## 2016-07-18 ENCOUNTER — Non-Acute Institutional Stay (SKILLED_NURSING_FACILITY): Payer: Medicare Other | Admitting: Gerontology

## 2016-07-18 DIAGNOSIS — M80051K Age-related osteoporosis with current pathological fracture, right femur, subsequent encounter for fracture with nonunion: Secondary | ICD-10-CM | POA: Diagnosis not present

## 2016-07-18 DIAGNOSIS — I950 Idiopathic hypotension: Secondary | ICD-10-CM

## 2016-07-22 NOTE — Progress Notes (Signed)
Location:      Place of Service:  SNF (31) Provider:  Toni Arthurs, NP-C  [name], MD  Patient Care Team: [name], MD as PCP - General [facility])  Extended Emergency Contact Information Primary Emergency Contact: [facility] Address: [address]          [place], [place] [address] Johnnette Litter of [place] Phone: [phone] Relation: Other Secondary Emergency Contact: HORNE,ROBERT F Address: [address]          [place], [place] [address] Home Phone: [phone] Relation: None  Code Status:  full Goals of care: Advanced Directive information Advanced Directives 07/02/2016  Does patient have an advance directive? No  Type of Advance Directive -  Does patient want to make changes to advanced directive? -  Would patient like information on creating an advanced directive? -  Pre-existing out of facility DNR order (yellow form or pink MOST form) -     Chief Complaint  Patient presents with  . Follow-up    HPI:  Pt is a 75 y.o. female seen today for follow-up. Patient was admitted to rehab status post fall with left hip fracture and surgical intervention. Patient reports pain is well controlled. She is progressing well with PT OT. No signs of bleeding patient also began to experience orthostatic hypotension when she first started working with therapy. Po fluids were encouraged and her home regimen antihypertensive was put on hold until further notice. Patient reports she has not had any further episodes of near syncope. Patient reports she is feeling well and is happy with her progress with therapy and is looking forward to going home. Vital signs stable, Labs table. No other complaints.    Past Medical History:  Diagnosis Date  . Anemia   . Arthritis    osteoarthritis  . Asthma    remote h/o asthma  . Depression    controlled  . H/O hypoglycemia   . H/O: GI bleed   . Headache(784.0)    h/o migraines  . Hearing loss, bilateral    wears  hearing aides  . History of blood transfusion    pt states she had yellow jaundice  . Hypertension   . Lupus (systemic lupus erythematosus) ([place]) 1995   in remission since 2005  . Nasal sinus congestion   . Osteoporosis with fracture   . Stress fracture of foot    left foot  . Vitamin B 12 deficiency    Past Surgical History:  Procedure Laterality Date  . ANTERIOR CERVICAL DECOMP/DISCECTOMY FUSION  2009  . INTRAMEDULLARY (IM) NAIL INTERTROCHANTERIC Right 07/01/2016   Procedure: INTRAMEDULLARY (IM) NAIL EXCHANGE OF FEMORAL ROD;  Surgeon: Hessie Knows, MD;  Location: [facility] ORS;  Service: Orthopedics;  Laterality: Right;  . ORIF FEMUR FRACTURE  2012  . PARTIAL GASTRECTOMY  1963   and reconstruction 1973  . TONSILLECTOMY  1945    Allergies  Allergen Reactions  . Sulfa Antibiotics Rash      Medication List       Accurate as of 07/18/16 11:59 PM. Always use your most recent med list.          aspirin 81 MG tablet Take 81 mg by mouth daily.   CALTRATE 600 PO Take 1 tablet by mouth 2 (two) times daily.   cholecalciferol 1000 units tablet Commonly known as:  VITAMIN D Take 1,000 Units by mouth daily.   citalopram 20 MG tablet Commonly known as:  CELEXA Take 20 mg by mouth daily.  cyanocobalamin 1000 MCG/ML injection Commonly known as:  (VITAMIN B-12) Inject into the muscle.   EXCEDRIN MIGRAINE PO Take 1 tablet by mouth daily as needed. For migraines   FORTEO [place] Inject into the skin every morning.   gabapentin 100 MG capsule Commonly known as:  NEURONTIN   HEMATINIC PLUS VIT/MINERALS 106-1 MG Tabs Take 1 tablet by mouth daily.   HYDROcodone-acetaminophen 5-325 MG tablet Commonly known as:  NORCO/VICODIN Take 1 tablet by mouth every 4 (four) hours as needed for moderate pain.   HYDROcodone-acetaminophen 5-325 MG tablet Commonly known as:  NORCO/VICODIN Take 1 tablet by mouth every 4 (four) hours as needed for moderate pain.   lisinopril 2.5 MG  tablet Commonly known as:  PRINIVIL,ZESTRIL Take 2.5 mg by mouth daily.   mirtazapine 30 MG tablet Commonly known as:  REMERON Take 30 mg by mouth at bedtime.   pantoprazole 40 MG tablet Commonly known as:  PROTONIX TAKE 1 TABLET BY MOUTH DAILY 1 HOUR BEFORE MEALS   PRESERVISION AREDS Tabs Take 2 tablets by mouth daily.   travoprost (benzalkonium) 0.004 % ophthalmic solution Commonly known as:  TRAVATAN Place 1 drop into both eyes at bedtime.   vitamin C 1000 MG tablet Take 500 mg by mouth 2 (two) times daily.   zolpidem 5 MG tablet Commonly known as:  AMBIEN Take 5 mg by mouth at bedtime as needed.       Review of Systems  Constitutional: Negative for activity change, appetite change, chills, diaphoresis and fever.  HENT: Negative.   Eyes: Negative.   Respiratory: Negative for apnea, cough, choking, chest tightness, shortness of breath and wheezing.   Cardiovascular: Negative for chest pain, palpitations and leg swelling.  Gastrointestinal: Negative.   Genitourinary: Negative.   Musculoskeletal: Negative for back pain, gait problem and myalgias. Arthralgias: typical arthritis.  Skin: Positive for rash. Negative for color change, pallor and wound.  Neurological: Negative for dizziness, tremors, syncope, speech difficulty, weakness, light-headedness, numbness and headaches.  Psychiatric/Behavioral: Negative.   All other systems reviewed and are negative.    There is no immunization history on file for this patient. Pertinent  Health Maintenance Due  Topic Date Due  . DEXA SCAN  10/22/2004  . PNA vac Low Risk Adult (1 of 2 - PCV13) 10/22/2004  . INFLUENZA VACCINE  05/27/2016   No flowsheet data found. Functional Status Survey:    Vitals:   07/18/16 0500  BP: (!) 119/56  Pulse: 68  Resp: 16  Temp: (!) 96.2 F (35.7 C)  SpO2: 97%   There is no height or weight on file to calculate BMI. Physical Exam  Constitutional: She is oriented to person, place, and  time. Vital signs are normal. She appears well-developed and well-nourished. She is active and cooperative. She does not appear ill. No distress.  HENT:  Head: Normocephalic and atraumatic.  Mouth/Throat: Uvula is midline, oropharynx is clear and moist and mucous membranes are normal. Mucous membranes are not pale, not dry and not cyanotic.  Eyes: Conjunctivae, EOM and lids are normal. Pupils are equal, round, and reactive to light.  Neck: Trachea normal, normal range of motion and full passive range of motion without pain. Neck supple. No JVD present. No tracheal deviation, no edema and no erythema present. No thyromegaly present.  Cardiovascular: Normal rate, regular rhythm, intact distal pulses and normal pulses.  Exam reveals no gallop, no distant heart sounds and no friction rub.   Murmur heard. Pulmonary/Chest: Effort normal and breath sounds normal.  No accessory muscle usage. No respiratory distress. She has no decreased breath sounds. She has no wheezes. She has no rhonchi. She has no rales. She exhibits no tenderness.  Abdominal: Normal appearance and bowel sounds are normal. She exhibits no distension and no ascites. There is no tenderness.  Musculoskeletal: She exhibits no edema or tenderness.       Right hip: She exhibits decreased range of motion (Right Total Hip Revision) and decreased strength.  Expected osteoarthritis, stiffness  Neurological: She is alert and oriented to person, place, and time. She has normal strength.  Skin: Skin is warm and dry. Laceration (right hip incision) and rash noted. Rash is pustular (slightly). She is not diaphoretic. No cyanosis. No pallor. Nails show no clubbing.     Psychiatric: She has a normal mood and affect. Her speech is normal and behavior is normal. Judgment and thought content normal. Cognition and memory are normal.  Nursing note and vitals reviewed.   Labs reviewed:  Recent Labs  07/02/16 0434 07/03/16 0339 07/08/16 0737  NA 139  143 137  K 3.7 3.6 3.7  CL 108 114* 100*  CO2 26 26 29   GLUCOSE 93 100* 90  BUN 15 12 13   CREATININE 0.62 0.61 0.60  CALCIUM 7.8* 7.7* 9.2  MG  --   --  1.8    Recent Labs  07/08/16 0737  AST 28  ALT 21  ALKPHOS 60  BILITOT 1.2  PROT 6.6  ALBUMIN 3.6    Recent Labs  12/14/15 1452 04/04/16 1400  07/03/16 0339 07/03/16 2208 07/04/16 0342 07/08/16 0737  WBC 8.7 8.7  < > 7.6  --  9.4 12.1*  NEUTROABS 5.7 5.7  --   --   --   --  8.0*  HGB 14.4 14.1  < > 7.7* 11.3* 11.3* 12.6  HCT 42.7 41.6  < > 22.4*  --  32.6* 37.3  MCV 95.6 95.5  < > 95.5  --  91.6 92.2  PLT 295 320  < > 167  --  165 405  < > = values in this interval not displayed. No results found for: TSH No results found for: HGBA1C No results found for: CHOL, HDL, LDLCALC, LDLDIRECT, TRIG, CHOLHDL  Significant Diagnostic Results in last 30 days:  Dg C-arm 1-60 Min  Result Date: 07/01/2016 CLINICAL DATA:  ORIF EXAM: RIGHT FEMUR 2 VIEWS; DG C-ARM 61-120 MIN COMPARISON:  Right femur radiographs 02/25/2016. FINDINGS: A single intraoperative AP view of the right hip demonstrates an intra medullary rod. The femoral neck screw has been revised. The distal aspect of the IM ride is not imaged. IMPRESSION: Revision right hip ORIF without radiographic evidence for complication. Electronically Signed   By: San Morelle M.D.   On: 07/01/2016 12:47   Dg Femur, Min 2 Views Right  Result Date: 07/01/2016 CLINICAL DATA:  ORIF EXAM: RIGHT FEMUR 2 VIEWS; DG C-ARM 61-120 MIN COMPARISON:  Right femur radiographs 02/25/2016. FINDINGS: A single intraoperative AP view of the right hip demonstrates an intra medullary rod. The femoral neck screw has been revised. The distal aspect of the IM ride is not imaged. IMPRESSION: Revision right hip ORIF without radiographic evidence for complication. Electronically Signed   By: San Morelle M.D.   On: 07/01/2016 12:47    Assessment/Plan 1. Idiopathic hypotension  Continue TED hose  on in the morning and off in the evening.   Continue slow position changes from lying to sitting to standing.   Continue to encourage  po fluid intake.   Will continue to hold antihypertensives.   Will continue to monitor vitals / BP's to assess need to restart medications.  2. 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   Refill request completed for Norco 5 / 325 one tablet q 4 hours PRN pain number 120, no refills.   Continue ice to the hip PRN for pain or swelling    Family/ staff Communication:   Total Time:  Documentation:  Face to Face:  Family/Phone:   Labs/tests ordered:   Medication list reviewed and assessed for continued appropriateness. Monthly medication orders reviewed and signed.  Vikki Ports, NP-C Geriatrics [facility] [phone] [place] [address], [place] [address] Cell Phone (Mon-Fri 8am-5pm):  [phone] On Call:  [phone] & follow prompts after 5pm & weekends Office Phone:  [phone] Office Fax:  [phone]

## 2016-07-23 ENCOUNTER — Non-Acute Institutional Stay (SKILLED_NURSING_FACILITY): Payer: Medicare Other | Admitting: Gerontology

## 2016-07-23 DIAGNOSIS — I950 Idiopathic hypotension: Secondary | ICD-10-CM | POA: Diagnosis not present

## 2016-07-23 DIAGNOSIS — M80051K Age-related osteoporosis with current pathological fracture, right femur, subsequent encounter for fracture with nonunion: Secondary | ICD-10-CM | POA: Diagnosis not present

## 2016-07-27 NOTE — Progress Notes (Signed)
Location:      Place of Service:  SNF (31) Provider:  Toni Arthurs, NP-C  [name], MD  Patient Care Team: [name], MD as PCP - General [facility])  Extended Emergency Contact Information Primary Emergency Contact: [place] Address: [address]          [place], [place] [address] Johnnette Litter of [place] Phone: [phone] Relation: Other Secondary Emergency Contact: HORNE,ROBERT F Address: [address]          [place], [place] [address] Home Phone: [phone] Relation: None  Code Status:  full Goals of care: Advanced Directive information Advanced Directives 07/02/2016  Does patient have an advance directive? No  Type of Advance Directive -  Does patient want to make changes to advanced directive? -  Would patient like information on creating an advanced directive? -  Pre-existing out of facility DNR order (yellow form or pink MOST form) -     Chief Complaint  Patient presents with  . Discharge Note    HPI:  Pt is a 75 y.o. female seen today forDischarge. Patient was admitted to rehab status post fall with left hip fracture and surgical intervention. Patient reports pain is well controlled. She is progressing well with PT OT. No signs of bleeding patient also began to experience orthostatic hypotension when she first started working with therapy. Po fluids were encouraged and her home regimen antihypertensive was put on hold until further notice. Patient reports she has not had any further episodes of near syncope. Patient reports she is feeling well and is happy with her progress with therapy and is looking forward to going home. Vital signs stable, Labs table. No other complaints.    Past Medical History:  Diagnosis Date  . Anemia   . Arthritis    osteoarthritis  . Asthma    remote h/o asthma  . Depression    controlled  . H/O hypoglycemia   . H/O: GI bleed   . Headache(784.0)    h/o migraines  . Hearing loss, bilateral    wears hearing aides  . History of blood transfusion    pt states she had yellow jaundice  . Hypertension   . Lupus (systemic lupus erythematosus) ([place]) 1995   in remission since 2005  . Nasal sinus congestion   . Osteoporosis with fracture   . Stress fracture of foot    left foot  . Vitamin B 12 deficiency    Past Surgical History:  Procedure Laterality Date  . ANTERIOR CERVICAL DECOMP/DISCECTOMY FUSION  2009  . INTRAMEDULLARY (IM) NAIL INTERTROCHANTERIC Right 07/01/2016   Procedure: INTRAMEDULLARY (IM) NAIL EXCHANGE OF FEMORAL ROD;  Surgeon: Hessie Knows, MD;  Location: [facility] ORS;  Service: Orthopedics;  Laterality: Right;  . ORIF FEMUR FRACTURE  2012  . PARTIAL GASTRECTOMY  1963   and reconstruction 1973  . TONSILLECTOMY  1945    Allergies  Allergen Reactions  . Sulfa Antibiotics Rash      Medication List       Accurate as of 07/23/16 11:59 PM. Always use your most recent med list.          aspirin 81 MG tablet Take 81 mg by mouth daily.   CALTRATE 600 PO Take 1 tablet by mouth 2 (two) times daily.   cholecalciferol 1000 units tablet Commonly known as:  VITAMIN D Take 1,000 Units by mouth daily.   citalopram 20 MG tablet Commonly known as:  CELEXA Take 20 mg by mouth daily.   cyanocobalamin  1000 MCG/ML injection Commonly known as:  (VITAMIN B-12) Inject into the muscle.   EXCEDRIN MIGRAINE PO Take 1 tablet by mouth daily as needed. For migraines   FORTEO [place] Inject into the skin every morning.   gabapentin 100 MG capsule Commonly known as:  NEURONTIN   HEMATINIC PLUS VIT/MINERALS 106-1 MG Tabs Take 1 tablet by mouth daily.   HYDROcodone-acetaminophen 5-325 MG tablet Commonly known as:  NORCO/VICODIN Take 1 tablet by mouth every 4 (four) hours as needed for moderate pain.   HYDROcodone-acetaminophen 5-325 MG tablet Commonly known as:  NORCO/VICODIN Take 1 tablet by mouth every 4 (four) hours as needed for moderate pain.   lisinopril 2.5 MG  tablet Commonly known as:  PRINIVIL,ZESTRIL Take 2.5 mg by mouth daily.   mirtazapine 30 MG tablet Commonly known as:  REMERON Take 30 mg by mouth at bedtime.   pantoprazole 40 MG tablet Commonly known as:  PROTONIX TAKE 1 TABLET BY MOUTH DAILY 1 HOUR BEFORE MEALS   PRESERVISION AREDS Tabs Take 2 tablets by mouth daily.   travoprost (benzalkonium) 0.004 % ophthalmic solution Commonly known as:  TRAVATAN Place 1 drop into both eyes at bedtime.   vitamin C 1000 MG tablet Take 500 mg by mouth 2 (two) times daily.   zolpidem 5 MG tablet Commonly known as:  AMBIEN Take 5 mg by mouth at bedtime as needed.       Review of Systems  Constitutional: Negative for activity change, appetite change, chills, diaphoresis and fever.  HENT: Negative.   Eyes: Negative.   Respiratory: Negative for apnea, cough, choking, chest tightness, shortness of breath and wheezing.   Cardiovascular: Negative for chest pain, palpitations and leg swelling.  Gastrointestinal: Negative.   Genitourinary: Negative.   Musculoskeletal: Negative for back pain, gait problem and myalgias. Arthralgias: typical arthritis.  Skin: Positive for rash. Negative for color change, pallor and wound.  Neurological: Negative for dizziness, tremors, syncope, speech difficulty, weakness, light-headedness, numbness and headaches.  Psychiatric/Behavioral: Negative.   All other systems reviewed and are negative.    There is no immunization history on file for this patient. Pertinent  Health Maintenance Due  Topic Date Due  . DEXA SCAN  10/22/2004  . PNA vac Low Risk Adult (1 of 2 - PCV13) 10/22/2004  . INFLUENZA VACCINE  05/27/2016   No flowsheet data found. Functional Status Survey:    Vitals:   07/22/16 0730  BP: 129/64  Pulse: 65  Resp: 16  Temp: (!) 96.2 F (35.7 C)  SpO2: 100%  Weight: 91 lb 12.8 oz (41.6 kg)   Body mass index is 17.93 kg/m. Physical Exam  Constitutional: She is oriented to person,  place, and time. Vital signs are normal. She appears well-developed and well-nourished. She is active and cooperative. She does not appear ill. No distress.  HENT:  Head: Normocephalic and atraumatic.  Mouth/Throat: Uvula is midline, oropharynx is clear and moist and mucous membranes are normal. Mucous membranes are not pale, not dry and not cyanotic.  Eyes: Conjunctivae, EOM and lids are normal. Pupils are equal, round, and reactive to light.  Neck: Trachea normal, normal range of motion and full passive range of motion without pain. Neck supple. No JVD present. No tracheal deviation, no edema and no erythema present. No thyromegaly present.  Cardiovascular: Normal rate, regular rhythm, intact distal pulses and normal pulses.  Exam reveals no gallop, no distant heart sounds and no friction rub.   Murmur heard. Pulmonary/Chest: Effort normal and breath sounds  normal. No accessory muscle usage. No respiratory distress. She has no decreased breath sounds. She has no wheezes. She has no rhonchi. She has no rales. She exhibits no tenderness.  Abdominal: Normal appearance and bowel sounds are normal. She exhibits no distension and no ascites. There is no tenderness.  Musculoskeletal: She exhibits no edema or tenderness.       Right hip: She exhibits decreased range of motion (Right Total Hip Revision) and decreased strength.  Expected osteoarthritis, stiffness  Neurological: She is alert and oriented to person, place, and time. She has normal strength.  Skin: Skin is warm and dry. Laceration (right hip incision) and rash noted. Rash is pustular (slightly). She is not diaphoretic. No cyanosis. No pallor. Nails show no clubbing.     Psychiatric: She has a normal mood and affect. Her speech is normal and behavior is normal. Judgment and thought content normal. Cognition and memory are normal.  Nursing note and vitals reviewed.   Labs reviewed:  Recent Labs  07/02/16 0434 07/03/16 0339  07/08/16 0737  NA 139 143 137  K 3.7 3.6 3.7  CL 108 114* 100*  CO2 26 26 29   GLUCOSE 93 100* 90  BUN 15 12 13   CREATININE 0.62 0.61 0.60  CALCIUM 7.8* 7.7* 9.2  MG  --   --  1.8    Recent Labs  07/08/16 0737  AST 28  ALT 21  ALKPHOS 60  BILITOT 1.2  PROT 6.6  ALBUMIN 3.6    Recent Labs  12/14/15 1452 04/04/16 1400  07/03/16 0339 07/03/16 2208 07/04/16 0342 07/08/16 0737  WBC 8.7 8.7  < > 7.6  --  9.4 12.1*  NEUTROABS 5.7 5.7  --   --   --   --  8.0*  HGB 14.4 14.1  < > 7.7* 11.3* 11.3* 12.6  HCT 42.7 41.6  < > 22.4*  --  32.6* 37.3  MCV 95.6 95.5  < > 95.5  --  91.6 92.2  PLT 295 320  < > 167  --  165 405  < > = values in this interval not displayed. No results found for: TSH No results found for: HGBA1C No results found for: CHOL, HDL, LDLCALC, LDLDIRECT, TRIG, CHOLHDL  Significant Diagnostic Results in last 30 days:  Dg C-arm 1-60 Min  Result Date: 07/01/2016 CLINICAL DATA:  ORIF EXAM: RIGHT FEMUR 2 VIEWS; DG C-ARM 61-120 MIN COMPARISON:  Right femur radiographs 02/25/2016. FINDINGS: A single intraoperative AP view of the right hip demonstrates an intra medullary rod. The femoral neck screw has been revised. The distal aspect of the IM ride is not imaged. IMPRESSION: Revision right hip ORIF without radiographic evidence for complication. Electronically Signed   By: San Morelle M.D.   On: 07/01/2016 12:47   Dg Femur, Min 2 Views Right  Result Date: 07/01/2016 CLINICAL DATA:  ORIF EXAM: RIGHT FEMUR 2 VIEWS; DG C-ARM 61-120 MIN COMPARISON:  Right femur radiographs 02/25/2016. FINDINGS: A single intraoperative AP view of the right hip demonstrates an intra medullary rod. The femoral neck screw has been revised. The distal aspect of the IM ride is not imaged. IMPRESSION: Revision right hip ORIF without radiographic evidence for complication. Electronically Signed   By: San Morelle M.D.   On: 07/01/2016 12:47    Assessment/Plan 1. Idiopathic  hypotension  Continue TED hose on in the morning and off in the evening.   Continue slow position changes from lying to sitting to standing.   Continue to  encourage po fluid intake.   Will continue to hold antihypertensives.   Follow up with PCP to monitor vitals / BP's to assess need to restart medications.  2. 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   RX written for Norco 5 / 325 one tablet q 6 hours PRN pain number 15, no refills.   Continue ice to the hip PRN for pain or swelling  Patient is being discharged with the following home health services:  Outpatient PT  Patient is being discharged with the following durable medical equipment:   W/C  Patient has been advised to f/u with their PCP in 1-2 weeks to bring them up to date on their rehab stay.  Social services at facility was responsible for arranging this appointment.  Pt was provided with a 30 day supply of prescriptions for medications and refills must be obtained from their PCP.  For controlled substances, a more limited supply may be provided adequate until PCP appointment only.    Family/ staff Communication:   Total Time:  Documentation:  Face to Face:  Family/Phone:   Labs/tests ordered: per pcp  Medication list reviewed and assessed for continued appropriateness. Monthly medication orders reviewed and signed.    Vikki Ports, NP-C Geriatrics [facility] [phone] [place], [place] [address] Cell Phone (Mon-Fri 8am-5pm):  [phone] On Call:  [phone] & follow prompts after 5pm & weekends Office Phone:  [phone] Office Fax:  [phone]

## 2016-08-06 ENCOUNTER — Other Ambulatory Visit: Payer: Self-pay

## 2016-09-24 ENCOUNTER — Encounter: Payer: Self-pay | Admitting: *Deleted

## 2016-09-29 ENCOUNTER — Other Ambulatory Visit: Payer: Self-pay | Admitting: Family Medicine

## 2016-09-29 DIAGNOSIS — Z1231 Encounter for screening mammogram for malignant neoplasm of breast: Secondary | ICD-10-CM

## 2016-09-30 ENCOUNTER — Encounter: Payer: Self-pay | Admitting: *Deleted

## 2016-09-30 ENCOUNTER — Encounter
Admission: RE | Disposition: A | Discharge status: Home / Self Care | Payer: Self-pay | Source: Ambulatory Visit | Attending: Ophthalmology

## 2016-09-30 ENCOUNTER — Ambulatory Visit
Admission: RE | Admit: 2016-09-30 | Discharge: 2016-09-30 | Disposition: A | Discharge status: Home / Self Care | Payer: Medicare Other | Source: Ambulatory Visit | Attending: Ophthalmology | Admitting: Ophthalmology

## 2016-09-30 ENCOUNTER — Ambulatory Visit: Payer: Medicare Other | Admitting: Anesthesiology

## 2016-09-30 DIAGNOSIS — I10 Essential (primary) hypertension: Secondary | ICD-10-CM | POA: Insufficient documentation

## 2016-09-30 DIAGNOSIS — F329 Major depressive disorder, single episode, unspecified: Secondary | ICD-10-CM | POA: Diagnosis not present

## 2016-09-30 DIAGNOSIS — H2512 Age-related nuclear cataract, left eye: Secondary | ICD-10-CM | POA: Insufficient documentation

## 2016-09-30 DIAGNOSIS — D649 Anemia, unspecified: Secondary | ICD-10-CM | POA: Insufficient documentation

## 2016-09-30 DIAGNOSIS — J45909 Unspecified asthma, uncomplicated: Secondary | ICD-10-CM | POA: Insufficient documentation

## 2016-09-30 DIAGNOSIS — Z79899 Other long term (current) drug therapy: Secondary | ICD-10-CM | POA: Diagnosis not present

## 2016-09-30 HISTORY — PX: CATARACT EXTRACTION W/PHACO: SHX586

## 2016-09-30 SURGERY — PHACOEMULSIFICATION, CATARACT, WITH IOL INSERTION
Anesthesia: Monitor Anesthesia Care | Site: Eye | Laterality: Left | Wound class: Clean

## 2016-09-30 MED ORDER — MOXIFLOXACIN HCL 0.5 % OP SOLN
OPHTHALMIC | Status: AC
  Administered 2016-09-30: 1 [drp] via OPHTHALMIC
  Filled 2016-09-30: qty 3

## 2016-09-30 MED ORDER — NA CHONDROIT SULF-NA HYALURON 40-17 MG/ML IO SOLN
INTRAOCULAR | Status: AC
  Filled 2016-09-30: qty 1

## 2016-09-30 MED ORDER — SODIUM CHLORIDE 0.9 % IV SOLN
INTRAVENOUS | Status: DC
  Administered 2016-09-30: 08:00:00 via INTRAVENOUS

## 2016-09-30 MED ORDER — ARMC OPHTHALMIC DILATING DROPS
OPHTHALMIC | Status: AC
  Administered 2016-09-30: 1 via OPHTHALMIC
  Filled 2016-09-30: qty 0.4

## 2016-09-30 MED ORDER — EPINEPHRINE PF 1 MG/ML IJ SOLN
INTRAMUSCULAR | Status: AC
  Filled 2016-09-30: qty 2

## 2016-09-30 MED ORDER — FENTANYL CITRATE (PF) 100 MCG/2ML IJ SOLN
INTRAMUSCULAR | Status: DC | PRN
  Administered 2016-09-30 (×2): 25 ug via INTRAVENOUS

## 2016-09-30 MED ORDER — NA CHONDROIT SULF-NA HYALURON 40-17 MG/ML IO SOLN
INTRAOCULAR | Status: DC | PRN
Start: 2016-09-30 — End: 2016-09-30
  Administered 2016-09-30: 1 mL via INTRAOCULAR

## 2016-09-30 MED ORDER — CARBACHOL 0.01 % IO SOLN
INTRAOCULAR | Status: DC | PRN
  Administered 2016-09-30: 0.5 mL via INTRAOCULAR

## 2016-09-30 MED ORDER — MIDAZOLAM HCL 2 MG/2ML IJ SOLN
INTRAMUSCULAR | Status: DC | PRN
Start: 2016-09-30 — End: 2016-09-30
  Administered 2016-09-30: 0.5 mg via INTRAVENOUS

## 2016-09-30 MED ORDER — MOXIFLOXACIN HCL 0.5 % OP SOLN
1.0000 [drp] | OPHTHALMIC | Status: AC
  Administered 2016-09-30 (×2): 1 [drp] via OPHTHALMIC

## 2016-09-30 MED ORDER — ARMC OPHTHALMIC DILATING DROPS
1.0000 "application " | OPHTHALMIC | Status: AC
  Administered 2016-09-30 (×2): 1 via OPHTHALMIC

## 2016-09-30 MED ORDER — LIDOCAINE HCL (PF) 4 % IJ SOLN
INTRAMUSCULAR | Status: AC
  Filled 2016-09-30: qty 5

## 2016-09-30 MED ORDER — EPINEPHRINE PF 1 MG/ML IJ SOLN
INTRAMUSCULAR | Status: DC | PRN
  Administered 2016-09-30: 200 mL via OPHTHALMIC

## 2016-09-30 MED ORDER — MOXIFLOXACIN HCL 0.5 % OP SOLN
OPHTHALMIC | Status: DC | PRN
  Administered 2016-09-30: 9 [drp] via OPHTHALMIC

## 2016-09-30 MED ORDER — LIDOCAINE HCL (PF) 4 % IJ SOLN
INTRAMUSCULAR | Status: DC | PRN
  Administered 2016-09-30: 200 mL via OPHTHALMIC

## 2016-09-30 MED ORDER — POVIDONE-IODINE 5 % OP SOLN
OPHTHALMIC | Status: AC
  Filled 2016-09-30: qty 30

## 2016-09-30 SURGICAL SUPPLY — 21 items
CANNULA ANT/CHMB 27GA (MISCELLANEOUS) ×3 IMPLANT
CUP MEDICINE 2OZ PLAST GRAD ST (MISCELLANEOUS) ×3 IMPLANT
GLOVE BIO SURGEON STRL SZ8 (GLOVE) ×3 IMPLANT
GLOVE BIOGEL M 6.5 STRL (GLOVE) ×3 IMPLANT
GLOVE SURG LX 8.0 MICRO (GLOVE) ×2
GLOVE SURG LX STRL 8.0 MICRO (GLOVE) ×1 IMPLANT
GOWN STRL REUS W/ TWL LRG LVL3 (GOWN DISPOSABLE) ×2 IMPLANT
GOWN STRL REUS W/TWL LRG LVL3 (GOWN DISPOSABLE) ×4
LENS IOL TECNIS ITEC 23.0 (Intraocular Lens) ×3 IMPLANT
PACK CATARACT (MISCELLANEOUS) ×3 IMPLANT
PACK CATARACT BRASINGTON LX (MISCELLANEOUS) ×3 IMPLANT
PACK EYE AFTER SURG (MISCELLANEOUS) ×3 IMPLANT
SOL BSS BAG (MISCELLANEOUS) ×3
SOL PREP PVP 2OZ (MISCELLANEOUS) ×3
SOLUTION BSS BAG (MISCELLANEOUS) ×1 IMPLANT
SOLUTION PREP PVP 2OZ (MISCELLANEOUS) ×1 IMPLANT
SYR 3ML LL SCALE MARK (SYRINGE) ×3 IMPLANT
SYR 5ML LL (SYRINGE) ×3 IMPLANT
SYR TB 1ML 27GX1/2 LL (SYRINGE) ×3 IMPLANT
WATER STERILE IRR 250ML POUR (IV SOLUTION) ×3 IMPLANT
WIPE NON LINTING 3.25X3.25 (MISCELLANEOUS) ×3 IMPLANT

## 2016-09-30 NOTE — Transfer of Care (Signed)
Immediate Anesthesia Transfer of Care Note  Patient: Renee Cabrera  Procedure(s) Performed: Procedure(s) with comments: CATARACT EXTRACTION PHACO AND INTRAOCULAR LENS PLACEMENT (IOC) (Left) - Lot# [id number] H [place]: 00:43.6 AP%: 19.6 CDE: 8.55  Patient Location: PACU  Anesthesia Type:MAC  Level of Consciousness: awake, alert  and oriented  Airway & Oxygen Therapy: Patient Spontanous Breathing  Post-op Assessment: Report given to RN and Post -op Vital signs reviewed and stable  Post vital signs: Reviewed and stable  Last Vitals:  Vitals:   09/30/16 0716  BP: (!) 144/69  Pulse: 66  Resp: 18  Temp: 36.7 C    Last Pain:  Vitals:   09/30/16 0716  TempSrc: Oral  PainSc: 0-No pain         Complications: No apparent anesthesia complications

## 2016-09-30 NOTE — Anesthesia Preprocedure Evaluation (Signed)
Anesthesia Evaluation  Patient identified by MRN, date of birth, ID band Patient awake    Reviewed: Allergy & Precautions, NPO status , Patient's Chart, lab work & pertinent test results  History of Anesthesia Complications Negative for: history of anesthetic complications  Airway Mallampati: II     Mouth opening: Limited Mouth Opening  Dental   Pulmonary asthma ,           Cardiovascular hypertension, Pt. on medications      Neuro/Psych Depression negative neurological ROS     GI/Hepatic negative GI ROS, Neg liver ROS,   Endo/Other  negative endocrine ROS  Renal/GU negative Renal ROS     Musculoskeletal   Abdominal   Peds  Hematology  (+) anemia ,   Anesthesia Other Findings   Reproductive/Obstetrics                             Anesthesia Physical Anesthesia Plan  ASA: II  Anesthesia Plan: MAC   Post-op Pain Management:    Induction:   Airway Management Planned:   Additional Equipment:   Intra-op Plan:   Post-operative Plan:   Informed Consent: I have reviewed the patients History and Physical, chart, labs and discussed the procedure including the risks, benefits and alternatives for the proposed anesthesia with the patient or authorized representative who has indicated his/her understanding and acceptance.     Plan Discussed with:   Anesthesia Plan Comments:         Anesthesia Quick Evaluation

## 2016-09-30 NOTE — H&P (Signed)
All labs reviewed. Abnormal studies sent to patients PCP when indicated.  Previous H&P reviewed, patient examined, there are NO CHANGES.  Renee Cabrera

## 2016-09-30 NOTE — Op Note (Signed)
PREOPERATIVE DIAGNOSIS:  Nuclear sclerotic cataract of the left eye.   POSTOPERATIVE DIAGNOSIS:  Nuclear sclerotic cataract of the left eye.   OPERATIVE PROCEDURE: Procedure(s): CATARACT EXTRACTION PHACO AND INTRAOCULAR LENS PLACEMENT (IOC)   SURGEON:  Birder Robson, MD.   ANESTHESIA:  Anesthesiologist: Gunnar Fusi, MD CRNA: Darlyne Russian, CRNA  1.      Managed anesthesia care. 2.     0.53ml of Shugarcaine was instilled following the paracentesis   COMPLICATIONS:  None.   TECHNIQUE:   Stop and chop   DESCRIPTION OF PROCEDURE:  The patient was examined and consented in the preoperative holding area where the aforementioned topical anesthesia was applied to the left eye and then brought back to the Operating Room where the left eye was prepped and draped in the usual sterile ophthalmic fashion and a lid speculum was placed. A paracentesis was created with the side port blade and the anterior chamber was filled with viscoelastic. A near clear corneal incision was performed with the steel keratome. A continuous curvilinear capsulorrhexis was performed with a cystotome followed by the capsulorrhexis forceps. Hydrodissection and hydrodelineation were carried out with BSS on a blunt cannula. The lens was removed in a stop and chop  technique and the remaining cortical material was removed with the irrigation-aspiration handpiece. The capsular bag was inflated with viscoelastic and the Technis [id number] lens was placed in the capsular bag without complication. The remaining viscoelastic was removed from the eye with the irrigation-aspiration handpiece. The wounds were hydrated. The anterior chamber was flushed with Miostat and the eye was inflated to physiologic pressure. 0.54ml Vigamox was placed in the anterior chamber. The wounds were found to be water tight. The eye was dressed with Vigamox. The patient was given protective glasses to wear throughout the day and a shield with which to sleep  tonight. The patient was also given drops with which to begin a drop regimen today and will follow-up with me in one day.  Implant Name Type Inv. Item Serial No. Manufacturer Lot No. LRB No. Used  LENS IOL DIOP 23.0 - [id number] Intraocular Lens LENS IOL DIOP 23.0 [id number]   Left 1    Procedure(s) with comments: CATARACT EXTRACTION PHACO AND INTRAOCULAR LENS PLACEMENT (IOC) (Left) - Lot# [id number] H [place]: 00:43.6 AP%: 19.6 CDE: 8.55  Electronically signed: [place] 09/30/2016 8:47 AM

## 2016-09-30 NOTE — Anesthesia Postprocedure Evaluation (Signed)
Anesthesia Post Note  Patient: Renee Cabrera  Procedure(s) Performed: Procedure(s) (LRB): CATARACT EXTRACTION PHACO AND INTRAOCULAR LENS PLACEMENT (IOC) (Left)  Patient location during evaluation: Nursing Unit Anesthesia Type: MAC Level of consciousness: awake, awake and alert and oriented Pain management: pain level controlled Vital Signs Assessment: post-procedure vital signs reviewed and stable Respiratory status: spontaneous breathing, nonlabored ventilation and respiratory function stable Cardiovascular status: blood pressure returned to baseline Postop Assessment: no headache and no signs of nausea or vomiting Anesthetic complications: no    Last Vitals:  Vitals:   09/30/16 0716 09/30/16 0850  BP: (!) 144/69 140/75  Pulse: 66 77  Resp: 18 18  Temp: 36.7 C     Last Pain:  Vitals:   09/30/16 0850  TempSrc: Temporal  PainSc: 0-No pain                 Darlyne Russian

## 2016-09-30 NOTE — Discharge Instructions (Signed)
Eye Surgery Discharge Instructions  Expect mild scratchy sensation or mild soreness. DO NOT RUB YOUR EYE!  The day of surgery:  Minimal physical activity, but bed rest is not required  No reading, computer work, or close hand work  No bending, lifting, or straining.  May watch TV  For 24 hours:  No driving, legal decisions, or alcoholic beverages  Safety precautions  Eat anything you prefer: It is better to start with liquids, then soup then solid foods.  _____ Eye patch should be worn until postoperative exam tomorrow.  ____ Solar shield eyeglasses should be worn for comfort in the sunlight/patch while sleeping  Resume all regular medications including aspirin or Coumadin if these were discontinued prior to surgery. You may shower, bathe, shave, or wash your hair. Tylenol may be taken for mild discomfort.  Call your doctor if you experience significant pain, nausea, or vomiting, fever > 101 or other signs of infection. [phone] or [phone] Specific instructions:  Follow-up Information    PORFILIO,WILLIAM LOUIS, MD Follow up.   Specialty:  Ophthalmology Why:  December 6 at 9:45am Contact information: [address] [place] [address] [phone]

## 2016-10-06 ENCOUNTER — Inpatient Hospital Stay: Payer: Medicare Other | Attending: Internal Medicine

## 2016-10-06 DIAGNOSIS — D509 Iron deficiency anemia, unspecified: Secondary | ICD-10-CM

## 2016-10-06 LAB — CBC WITH DIFFERENTIAL/PLATELET
Basophils Absolute: 0.1 K/uL (ref 0–0.1)
Basophils Relative: 1 %
Eosinophils Absolute: 0.1 K/uL (ref 0–0.7)
Eosinophils Relative: 1 %
HCT: 42.6 % (ref 35.0–47.0)
Hemoglobin: 14.1 g/dL (ref 12.0–16.0)
Lymphocytes Relative: 27 %
Lymphs Abs: 2.3 K/uL (ref 1.0–3.6)
MCH: 32.1 pg (ref 26.0–34.0)
MCHC: 33.2 g/dL (ref 32.0–36.0)
MCV: 96.7 fL (ref 80.0–100.0)
Monocytes Absolute: 0.4 K/uL (ref 0.2–0.9)
Monocytes Relative: 5 %
Neutro Abs: 5.6 K/uL (ref 1.4–6.5)
Neutrophils Relative %: 66 %
Platelets: 317 K/uL (ref 150–440)
RBC: 4.4 MIL/uL (ref 3.80–5.20)
RDW: 14.1 % (ref 11.5–14.5)
WBC: 8.5 K/uL (ref 3.6–11.0)

## 2016-10-06 LAB — IRON AND TIBC
IRON: 72 ug/dL (ref 28–170)
SATURATION RATIOS: 22 % (ref 10.4–31.8)
TIBC: 327 ug/dL (ref 250–450)
UIBC: 256 ug/dL

## 2016-10-06 LAB — FERRITIN: Ferritin: 56 ng/mL (ref 11–307)

## 2016-10-07 ENCOUNTER — Telehealth: Payer: Self-pay | Admitting: *Deleted

## 2016-10-07 NOTE — Telephone Encounter (Signed)
Contacted pt. Results reviewed patient. Pt instructed results are normal-no need for any intravenous iron infusions. Will see pt back in June 2018 as previously discussed. Teach back process performed with patient. She thanked me for calling

## 2016-10-07 NOTE — Progress Notes (Signed)
Please inform pt that labs look good; no need for IV iron; order CBC/iron studies/ferritin/bmp prior to next visit- Thx

## 2016-10-07 NOTE — Telephone Encounter (Signed)
-----   Message from Cammie Sickle, MD sent at 10/07/2016  7:45 AM EST ----- Please inform pt that labs look good; no need for IV iron; order CBC/iron studies/ferritin/bmp prior to next visit- Thx

## 2016-11-05 ENCOUNTER — Ambulatory Visit
Admission: RE | Admit: 2016-11-05 | Discharge: 2016-11-05 | Disposition: A | Discharge status: Home / Self Care | Payer: Medicare Other | Source: Ambulatory Visit | Attending: Family Medicine | Admitting: Family Medicine

## 2016-11-05 DIAGNOSIS — Z1231 Encounter for screening mammogram for malignant neoplasm of breast: Secondary | ICD-10-CM | POA: Insufficient documentation

## 2017-04-06 ENCOUNTER — Other Ambulatory Visit: Payer: Medicare Other

## 2017-04-08 ENCOUNTER — Ambulatory Visit: Payer: Medicare Other | Admitting: Internal Medicine

## 2017-04-30 ENCOUNTER — Inpatient Hospital Stay: Payer: Medicare Other | Attending: Internal Medicine | Admitting: *Deleted

## 2017-04-30 DIAGNOSIS — Z882 Allergy status to sulfonamides status: Secondary | ICD-10-CM | POA: Diagnosis not present

## 2017-04-30 DIAGNOSIS — I10 Essential (primary) hypertension: Secondary | ICD-10-CM | POA: Insufficient documentation

## 2017-04-30 DIAGNOSIS — M810 Age-related osteoporosis without current pathological fracture: Secondary | ICD-10-CM | POA: Insufficient documentation

## 2017-04-30 DIAGNOSIS — Z8711 Personal history of peptic ulcer disease: Secondary | ICD-10-CM | POA: Diagnosis not present

## 2017-04-30 DIAGNOSIS — E538 Deficiency of other specified B group vitamins: Secondary | ICD-10-CM | POA: Diagnosis not present

## 2017-04-30 DIAGNOSIS — Z8719 Personal history of other diseases of the digestive system: Secondary | ICD-10-CM | POA: Insufficient documentation

## 2017-04-30 DIAGNOSIS — D509 Iron deficiency anemia, unspecified: Secondary | ICD-10-CM | POA: Diagnosis not present

## 2017-04-30 DIAGNOSIS — L930 Discoid lupus erythematosus: Secondary | ICD-10-CM | POA: Insufficient documentation

## 2017-04-30 DIAGNOSIS — Z7982 Long term (current) use of aspirin: Secondary | ICD-10-CM | POA: Insufficient documentation

## 2017-04-30 DIAGNOSIS — Z9049 Acquired absence of other specified parts of digestive tract: Secondary | ICD-10-CM | POA: Diagnosis not present

## 2017-04-30 DIAGNOSIS — Z79899 Other long term (current) drug therapy: Secondary | ICD-10-CM | POA: Insufficient documentation

## 2017-04-30 LAB — CBC WITH DIFFERENTIAL/PLATELET
BASOS ABS: 0.1 10*3/uL (ref 0–0.1)
Basophils Relative: 1 %
Eosinophils Absolute: 0 10*3/uL (ref 0–0.7)
Eosinophils Relative: 1 %
HEMATOCRIT: 39.7 % (ref 35.0–47.0)
HEMOGLOBIN: 13.5 g/dL (ref 12.0–16.0)
LYMPHS PCT: 27 %
Lymphs Abs: 1.9 10*3/uL (ref 1.0–3.6)
MCH: 32.3 pg (ref 26.0–34.0)
MCHC: 33.9 g/dL (ref 32.0–36.0)
MCV: 95.2 fL (ref 80.0–100.0)
Monocytes Absolute: 0.4 10*3/uL (ref 0.2–0.9)
Monocytes Relative: 6 %
NEUTROS ABS: 4.7 10*3/uL (ref 1.4–6.5)
Neutrophils Relative %: 65 %
Platelets: 343 10*3/uL (ref 150–440)
RBC: 4.17 MIL/uL (ref 3.80–5.20)
RDW: 13.5 % (ref 11.5–14.5)
WBC: 7.1 10*3/uL (ref 3.6–11.0)

## 2017-04-30 LAB — BASIC METABOLIC PANEL
ANION GAP: 9 (ref 5–15)
BUN: 18 mg/dL (ref 6–20)
CHLORIDE: 106 mmol/L (ref 101–111)
CO2: 24 mmol/L (ref 22–32)
Calcium: 9 mg/dL (ref 8.9–10.3)
Creatinine, Ser: 0.91 mg/dL (ref 0.44–1.00)
GFR calc Af Amer: >60 mL/min (ref >60)
GFR, EST NON AFRICAN AMERICAN: 59 mL/min — AB (ref >60)
GLUCOSE: 88 mg/dL (ref 65–99)
POTASSIUM: 4.5 mmol/L (ref 3.5–5.1)
Sodium: 139 mmol/L (ref 135–145)

## 2017-04-30 LAB — IRON AND TIBC
Iron: 112 ug/dL (ref 28–170)
SATURATION RATIOS: 39 % — AB (ref 10.4–31.8)
TIBC: 284 ug/dL (ref 250–450)
UIBC: 172 ug/dL

## 2017-04-30 LAB — FERRITIN: Ferritin: 82 ng/mL (ref 11–307)

## 2017-05-04 ENCOUNTER — Ambulatory Visit: Payer: Medicare Other | Admitting: Internal Medicine

## 2017-05-06 ENCOUNTER — Inpatient Hospital Stay (HOSPITAL_BASED_OUTPATIENT_CLINIC_OR_DEPARTMENT_OTHER): Payer: Medicare Other | Admitting: Internal Medicine

## 2017-05-06 VITALS — BP 126/71 | HR 79 | Temp 98.0°F | Resp 18 | Ht 60.0 in | Wt 92.5 lb

## 2017-05-06 DIAGNOSIS — Z7982 Long term (current) use of aspirin: Secondary | ICD-10-CM | POA: Diagnosis not present

## 2017-05-06 DIAGNOSIS — L930 Discoid lupus erythematosus: Secondary | ICD-10-CM

## 2017-05-06 DIAGNOSIS — Z8711 Personal history of peptic ulcer disease: Secondary | ICD-10-CM | POA: Diagnosis not present

## 2017-05-06 DIAGNOSIS — Z882 Allergy status to sulfonamides status: Secondary | ICD-10-CM | POA: Diagnosis not present

## 2017-05-06 DIAGNOSIS — Z9049 Acquired absence of other specified parts of digestive tract: Secondary | ICD-10-CM | POA: Diagnosis not present

## 2017-05-06 DIAGNOSIS — E538 Deficiency of other specified B group vitamins: Secondary | ICD-10-CM

## 2017-05-06 DIAGNOSIS — Z8719 Personal history of other diseases of the digestive system: Secondary | ICD-10-CM | POA: Diagnosis not present

## 2017-05-06 DIAGNOSIS — M810 Age-related osteoporosis without current pathological fracture: Secondary | ICD-10-CM

## 2017-05-06 DIAGNOSIS — Z79899 Other long term (current) drug therapy: Secondary | ICD-10-CM

## 2017-05-06 DIAGNOSIS — I10 Essential (primary) hypertension: Secondary | ICD-10-CM

## 2017-05-06 DIAGNOSIS — D509 Iron deficiency anemia, unspecified: Secondary | ICD-10-CM

## 2017-05-06 DIAGNOSIS — D508 Other iron deficiency anemias: Secondary | ICD-10-CM

## 2017-05-06 NOTE — Progress Notes (Signed)
[facility] OFFICE PROGRESS NOTE  Patient Care Team: [name], MD as PCP - General (Family Medicine)  Cancer Staging No matching staging information was found for the patient.    No history exists.  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 Patient also intolerant of oral iron  -   received parenteral iron therapy with Feraheme 510 mg x 2 doses in May 2014.   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 I reviewed the patient's prior charts/pertinent labs/imaging in detail; findings are summarized above.     INTERVAL HISTORY:  Renee Cabrera 76 y.o.  female pleasant patient above history of Iron deficiency anemia status post partial gastrectomy in 1960s is here for follow-up  Patient stated that she had a recent right hip surgery- after which she bled.  Patient otherwise denies any blood in stools black stools. Denies any nausea vomiting. Denies any fatigue or weight loss. Denies any difficulty swallowing.  REVIEW OF SYSTEMS:  A complete 10 point review of system is done which is negative except mentioned above/history of present illness.   PAST MEDICAL HISTORY :  Past Medical History:  Diagnosis Date  . Anemia   . Arthritis    osteoarthritis/ RA  . Asthma    remote h/o asthma  . Depression    controlled  . H/O hypoglycemia   . H/O: GI bleed   . Headache(784.0)    h/o migraines  . Hearing loss, bilateral    wears hearing aides  . History of blood transfusion    pt states she had yellow jaundice  . Hypertension   . Lupus (systemic lupus erythematosus) ([place]) 1995   in remission since 2005  . Nasal sinus congestion   . Osteoporosis with fracture   . Stress fracture of foot    left foot  . Vitamin B 12 deficiency     PAST SURGICAL HISTORY :   Past Surgical  History:  Procedure Laterality Date  . ANTERIOR CERVICAL DECOMP/DISCECTOMY FUSION  2009  . CATARACT EXTRACTION W/PHACO Left 09/30/2016   Procedure: CATARACT EXTRACTION PHACO AND INTRAOCULAR LENS PLACEMENT (IOC);  Surgeon: Birder Robson, MD;  Location: [facility] ORS;  Service: Ophthalmology;  Laterality: Left;  Lot# [id number] H [place]: 00:43.6 AP%: 19.6 CDE: 8.55  . INTRAMEDULLARY (IM) NAIL INTERTROCHANTERIC Right 07/01/2016   Procedure: INTRAMEDULLARY (IM) NAIL EXCHANGE OF FEMORAL ROD;  Surgeon: Hessie Knows, MD;  Location: [facility] ORS;  Service: Orthopedics;  Laterality: Right;  . ORIF FEMUR FRACTURE  2012  . PARTIAL GASTRECTOMY  1963   and reconstruction 1973  . TONSILLECTOMY  1945    FAMILY HISTORY :   Family History  Problem Relation Age of Onset  . Breast cancer Neg Hx     SOCIAL HISTORY:   Social History  Substance Use Topics  . Smoking status: Never Smoker  . Smokeless tobacco: Never Used  . Alcohol use No    ALLERGIES:  is allergic to sulfa antibiotics.  MEDICATIONS:  Current Outpatient Prescriptions  Medication Sig Dispense Refill  . Ascorbic Acid (VITAMIN C) 1000 MG tablet Take 500 mg by mouth 2 (two) times daily.     Marland Kitchen aspirin 81 MG tablet Take 81 mg by mouth daily.    . BELSOMRA 10 MG TABS Take 1 tablet by mouth at bedtime.  5  . Calcium Carbonate (CALTRATE 600  PO) Take 1 tablet by mouth 2 (two) times daily.    . cholecalciferol (VITAMIN D) 1000 UNITS tablet Take 1,000 Units by mouth daily.    . citalopram (CELEXA) 20 MG tablet Take 20 mg by mouth daily.    . cyanocobalamin (,VITAMIN B-12,) 1000 MCG/ML injection Inject 1,000 mcg into the muscle every 30 (thirty) days.     . Fe Fum-FA-B Cmp-C-Zn-Mg-Mn-Cu (HEMATINIC PLUS VIT/MINERALS) 106-1 MG TABS Take 1 tablet by mouth daily.  3  . gabapentin (NEURONTIN) 100 MG capsule Take 100-200 mg by mouth 2 (two) times daily. 100mg  in the  AM and 200mg  in the PM    . meloxicam (MOBIC) 7.5 MG tablet Take 7.5 mg by mouth daily.    .  mirtazapine (REMERON) 30 MG tablet Take 30 mg by mouth at bedtime.    . Multiple Vitamins-Minerals (PRESERVISION AREDS) TABS Take 1 tablet by mouth 2 (two) times daily.     . pantoprazole (PROTONIX) 40 MG tablet TAKE 1 TABLET BY MOUTH DAILY 1 HOUR BEFORE MEALS  1  . Teriparatide, Recombinant, (FORTEO ) Inject 600 mcg into the skin every morning. For 2 years.    . travoprost, benzalkonium, (TRAVATAN) 0.004 % ophthalmic solution Place 1 drop into both eyes at bedtime.     No current facility-administered medications for this visit.     PHYSICAL EXAMINATION: ECOG PERFORMANCE STATUS: 0 - Asymptomatic  BP 126/71   Pulse 79   Temp 98 F (36.7 C) (Tympanic)   Resp 18   Ht 5' (1.524 m)   Wt 92 lb 8 oz (42 kg)   BMI 18.07 kg/m   Filed Weights   05/06/17 0930  Weight: 92 lb 8 oz (42 kg)    GENERAL: Well-nourished well-developed; Alert, no distress and comfortable.   Alone. EYES: no pallor or icterus OROPHARYNX: no thrush or ulceration; good dentition  NECK: supple, no masses felt LYMPH:  no palpable lymphadenopathy in the cervical, axillary or inguinal regions LUNGS: clear to auscultation and  No wheeze or crackles HEART/CVS: regular rate & rhythm and no murmurs; No lower extremity edema ABDOMEN:abdomen soft, non-tender and normal bowel sounds Musculoskeletal:no cyanosis of digits and no clubbing  PSYCH: alert & oriented x 3 with fluent speech NEURO: no focal motor/sensory deficits SKIN:  no rashes or significant lesions  LABORATORY DATA:  I have reviewed the data as listed    Component Value Date/Time   NA 139 04/30/2017 1128   NA 142 02/09/2013 0515   K 4.5 04/30/2017 1128   K 4.0 02/09/2013 0515   CL 106 04/30/2017 1128   CL 113 (H) 02/09/2013 0515   CO2 24 04/30/2017 1128   CO2 25 02/09/2013 0515   GLUCOSE 88 04/30/2017 1128   GLUCOSE 80 02/09/2013 0515   BUN 18 04/30/2017 1128   BUN 20 (H) 02/09/2013 0515   CREATININE 0.91 04/30/2017 1128   CREATININE 0.58 (L)  02/09/2013 0515   CALCIUM 9.0 04/30/2017 1128   CALCIUM 7.5 (L) 02/09/2013 0515   PROT 6.6 07/08/2016 0737   PROT 6.8 02/08/2013 1558   ALBUMIN 3.6 07/08/2016 0737   ALBUMIN 3.8 02/08/2013 1558   AST 28 07/08/2016 0737   AST 16 02/08/2013 1558   ALT 21 07/08/2016 0737   ALT 12 02/08/2013 1558   ALKPHOS 60 07/08/2016 0737   ALKPHOS 53 02/08/2013 1558   BILITOT 1.2 07/08/2016 0737   BILITOT 0.2 02/08/2013 1558   GFRNONAA 59 (L) 04/30/2017 1128   GFRNONAA >60 02/09/2013 0515  GFRAA >60 04/30/2017 1128   GFRAA >60 02/09/2013 0515    No results found for: SPEP, UPEP  Lab Results  Component Value Date   WBC 7.1 04/30/2017   NEUTROABS 4.7 04/30/2017   HGB 13.5 04/30/2017   HCT 39.7 04/30/2017   MCV 95.2 04/30/2017   PLT 343 04/30/2017      Chemistry      Component Value Date/Time   NA 139 04/30/2017 1128   NA 142 02/09/2013 0515   K 4.5 04/30/2017 1128   K 4.0 02/09/2013 0515   CL 106 04/30/2017 1128   CL 113 (H) 02/09/2013 0515   CO2 24 04/30/2017 1128   CO2 25 02/09/2013 0515   BUN 18 04/30/2017 1128   BUN 20 (H) 02/09/2013 0515   CREATININE 0.91 04/30/2017 1128   CREATININE 0.58 (L) 02/09/2013 0515      Component Value Date/Time   CALCIUM 9.0 04/30/2017 1128   CALCIUM 7.5 (L) 02/09/2013 0515   ALKPHOS 60 07/08/2016 0737   ALKPHOS 53 02/08/2013 1558   AST 28 07/08/2016 0737   AST 16 02/08/2013 1558   ALT 21 07/08/2016 0737   ALT 12 02/08/2013 1558   BILITOT 1.2 07/08/2016 0737   BILITOT 0.2 02/08/2013 1558       RADIOGRAPHIC STUDIES: I have personally reviewed the radiological images as listed and agreed with the findings in the report. No results found.   ASSESSMENT & PLAN:  Iron deficiency anemia following bariatric surgery IDA-s/p partial gastrectomy. Today hemoglobin is normal iron studies within normal limits. I would not recommend any IV iron this time.   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   # follow up in 6 months/albs- few days prior.    Orders Placed This Encounter  Procedures  . CBC with Differential    Standing Status:   Future    Standing Expiration Date:   05/06/2018  . Basic metabolic panel    Standing Status:   Future    Standing Expiration Date:   05/06/2018  . Ferritin    Standing Status:   Future    Standing Expiration Date:   05/06/2018  . Iron and TIBC    Standing Status:   Future    Standing Expiration Date:   05/06/2018   All questions were answered. The patient knows to call the clinic with any problems, questions or concerns.      Cammie Sickle, MD 05/06/2017 6:21 PM

## 2017-05-06 NOTE — Assessment & Plan Note (Addendum)
IDA-s/p partial gastrectomy. Today hemoglobin is normal iron studies within normal limits. I would not recommend any IV iron this time.   # B12 deficiency secondary to surgery- continue parental B12 with PCP  # lupus- in remisssion; Dr.KC- discoid.   # follow up in 6 months/albs- few days prior.

## 2017-09-16 ENCOUNTER — Other Ambulatory Visit: Payer: Self-pay | Admitting: Family Medicine

## 2017-09-16 DIAGNOSIS — Z1231 Encounter for screening mammogram for malignant neoplasm of breast: Secondary | ICD-10-CM

## 2017-11-03 ENCOUNTER — Inpatient Hospital Stay: Payer: Medicare Other | Attending: Internal Medicine

## 2017-11-03 DIAGNOSIS — Z9049 Acquired absence of other specified parts of digestive tract: Secondary | ICD-10-CM | POA: Diagnosis not present

## 2017-11-03 DIAGNOSIS — Z79899 Other long term (current) drug therapy: Secondary | ICD-10-CM | POA: Insufficient documentation

## 2017-11-03 DIAGNOSIS — I10 Essential (primary) hypertension: Secondary | ICD-10-CM | POA: Insufficient documentation

## 2017-11-03 DIAGNOSIS — M329 Systemic lupus erythematosus, unspecified: Secondary | ICD-10-CM | POA: Insufficient documentation

## 2017-11-03 DIAGNOSIS — E538 Deficiency of other specified B group vitamins: Secondary | ICD-10-CM | POA: Insufficient documentation

## 2017-11-03 DIAGNOSIS — Z8719 Personal history of other diseases of the digestive system: Secondary | ICD-10-CM | POA: Diagnosis not present

## 2017-11-03 DIAGNOSIS — Z7982 Long term (current) use of aspirin: Secondary | ICD-10-CM | POA: Insufficient documentation

## 2017-11-03 DIAGNOSIS — L930 Discoid lupus erythematosus: Secondary | ICD-10-CM | POA: Diagnosis not present

## 2017-11-03 DIAGNOSIS — D509 Iron deficiency anemia, unspecified: Secondary | ICD-10-CM | POA: Diagnosis not present

## 2017-11-03 DIAGNOSIS — Z8711 Personal history of peptic ulcer disease: Secondary | ICD-10-CM | POA: Diagnosis not present

## 2017-11-03 DIAGNOSIS — M810 Age-related osteoporosis without current pathological fracture: Secondary | ICD-10-CM | POA: Diagnosis not present

## 2017-11-03 DIAGNOSIS — K9589 Other complications of other bariatric procedure: Secondary | ICD-10-CM

## 2017-11-03 DIAGNOSIS — Z882 Allergy status to sulfonamides status: Secondary | ICD-10-CM | POA: Diagnosis not present

## 2017-11-03 LAB — CBC WITH DIFFERENTIAL/PLATELET
BASOS PCT: 1 %
Basophils Absolute: 0.1 10*3/uL (ref 0–0.1)
EOS PCT: 1 %
Eosinophils Absolute: 0.1 10*3/uL (ref 0–0.7)
HCT: 42.8 % (ref 35.0–47.0)
Hemoglobin: 13.9 g/dL (ref 12.0–16.0)
Lymphocytes Relative: 19 %
Lymphs Abs: 1.5 10*3/uL (ref 1.0–3.6)
MCH: 32.2 pg (ref 26.0–34.0)
MCHC: 32.6 g/dL (ref 32.0–36.0)
MCV: 98.8 fL (ref 80.0–100.0)
MONO ABS: 0.7 10*3/uL (ref 0.2–0.9)
Monocytes Relative: 8 %
NEUTROS ABS: 5.8 10*3/uL (ref 1.4–6.5)
Neutrophils Relative %: 71 %
PLATELETS: 321 10*3/uL (ref 150–440)
RBC: 4.33 MIL/uL (ref 3.80–5.20)
RDW: 14.2 % (ref 11.5–14.5)
WBC: 8.2 10*3/uL (ref 3.6–11.0)

## 2017-11-03 LAB — BASIC METABOLIC PANEL
ANION GAP: 9 (ref 5–15)
BUN: 23 mg/dL — AB (ref 6–20)
CALCIUM: 9.1 mg/dL (ref 8.9–10.3)
CO2: 26 mmol/L (ref 22–32)
Chloride: 105 mmol/L (ref 101–111)
Creatinine, Ser: 0.87 mg/dL (ref 0.44–1.00)
GFR calc Af Amer: >60 mL/min (ref >60)
GLUCOSE: 101 mg/dL — AB (ref 65–99)
Potassium: 4.9 mmol/L (ref 3.5–5.1)
SODIUM: 140 mmol/L (ref 135–145)

## 2017-11-03 LAB — IRON AND TIBC
Iron: 69 ug/dL (ref 28–170)
Saturation Ratios: 24 % (ref 10.4–31.8)
TIBC: 287 ug/dL (ref 250–450)
UIBC: 218 ug/dL

## 2017-11-03 LAB — FERRITIN: FERRITIN: 73 ng/mL (ref 11–307)

## 2017-11-06 ENCOUNTER — Inpatient Hospital Stay: Payer: Medicare Other | Admitting: Internal Medicine

## 2017-11-06 ENCOUNTER — Encounter: Payer: Self-pay | Admitting: Internal Medicine

## 2017-11-06 VITALS — BP 133/82 | HR 65 | Resp 16 | Wt 93.0 lb

## 2017-11-06 DIAGNOSIS — I10 Essential (primary) hypertension: Secondary | ICD-10-CM

## 2017-11-06 DIAGNOSIS — Z9049 Acquired absence of other specified parts of digestive tract: Secondary | ICD-10-CM | POA: Diagnosis not present

## 2017-11-06 DIAGNOSIS — D509 Iron deficiency anemia, unspecified: Secondary | ICD-10-CM

## 2017-11-06 DIAGNOSIS — M329 Systemic lupus erythematosus, unspecified: Secondary | ICD-10-CM

## 2017-11-06 DIAGNOSIS — Z882 Allergy status to sulfonamides status: Secondary | ICD-10-CM | POA: Diagnosis not present

## 2017-11-06 DIAGNOSIS — K9589 Other complications of other bariatric procedure: Secondary | ICD-10-CM

## 2017-11-06 DIAGNOSIS — Z8719 Personal history of other diseases of the digestive system: Secondary | ICD-10-CM | POA: Diagnosis not present

## 2017-11-06 DIAGNOSIS — Z8711 Personal history of peptic ulcer disease: Secondary | ICD-10-CM

## 2017-11-06 DIAGNOSIS — E538 Deficiency of other specified B group vitamins: Secondary | ICD-10-CM

## 2017-11-06 DIAGNOSIS — Z79899 Other long term (current) drug therapy: Secondary | ICD-10-CM

## 2017-11-06 DIAGNOSIS — Z7982 Long term (current) use of aspirin: Secondary | ICD-10-CM

## 2017-11-06 DIAGNOSIS — M810 Age-related osteoporosis without current pathological fracture: Secondary | ICD-10-CM

## 2017-11-06 DIAGNOSIS — L930 Discoid lupus erythematosus: Secondary | ICD-10-CM

## 2017-11-06 NOTE — Assessment & Plan Note (Addendum)
IDA-s/p partial gastrectomy. Today hemoglobin is normal iron studies within normal limits. I would not recommend any IV iron this time.   # B12 deficiency secondary to surgery- continue parental B12 with PCP  # lupus- in remisssion; Dr.KC- discoid-STABLE as per pt.   # follow up in 12 months/labs- few days prior.

## 2017-11-06 NOTE — Progress Notes (Signed)
[place] OFFICE PROGRESS NOTE  Patient Care Team: [name], MD as PCP - General (Family Medicine)  Cancer Staging No matching staging information was found for the patient.    No history exists.  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 Patient also intolerant of oral iron  -   received parenteral iron therapy with Feraheme 510 mg x 2 doses in May 2014.   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 I reviewed the patient's prior charts/pertinent labs/imaging in detail; findings are summarized above.     INTERVAL HISTORY:  Renee Cabrera 77 y.o.  female pleasant patient above history of Iron deficiency anemia status post partial gastrectomy in 1960s is here for follow-up.  She denies any fatigue.  Denies any weight loss.  No difficulty swallowing.  Patient otherwise denies any blood in stools black stools. Denies any nausea vomiting. Denies any fatigue or weight loss. Denies any difficulty swallowing.  REVIEW OF SYSTEMS:  A complete 10 point review of system is done which is negative except mentioned above/history of present illness.   PAST MEDICAL HISTORY :  Past Medical History:  Diagnosis Date  . Anemia   . Arthritis    osteoarthritis/ RA  . Asthma    remote h/o asthma  . Depression    controlled  . H/O hypoglycemia   . H/O: GI bleed   . Headache(784.0)    h/o migraines  . Hearing loss, bilateral    wears hearing aides  . History of blood transfusion    pt states she had yellow jaundice  . Hypertension   . Lupus (systemic lupus erythematosus) ([employer]) 1995   in remission since 2005  . Nasal sinus congestion   . Osteoporosis with fracture   . Stress fracture of foot    left foot  . Vitamin B 12 deficiency     PAST SURGICAL HISTORY :   Past Surgical History:   Procedure Laterality Date  . ANTERIOR CERVICAL DECOMP/DISCECTOMY FUSION  2009  . CATARACT EXTRACTION W/PHACO Left 09/30/2016   Procedure: CATARACT EXTRACTION PHACO AND INTRAOCULAR LENS PLACEMENT (IOC);  Surgeon: Birder Robson, MD;  Location: [facility] ORS;  Service: Ophthalmology;  Laterality: Left;  Lot# [id number] H [place]: 00:43.6 AP%: 19.6 CDE: 8.55  . INTRAMEDULLARY (IM) NAIL INTERTROCHANTERIC Right 07/01/2016   Procedure: INTRAMEDULLARY (IM) NAIL EXCHANGE OF FEMORAL ROD;  Surgeon: Hessie Knows, MD;  Location: [facility] ORS;  Service: Orthopedics;  Laterality: Right;  . ORIF FEMUR FRACTURE  2012  . PARTIAL GASTRECTOMY  1963   and reconstruction 1973  . TONSILLECTOMY  1945    FAMILY HISTORY :   Family History  Problem Relation Age of Onset  . Breast cancer Neg Hx     SOCIAL HISTORY:   Social History   Tobacco Use  . Smoking status: Never Smoker  . Smokeless tobacco: Never Used  Substance Use Topics  . Alcohol use: No  . Drug use: No    ALLERGIES:  is allergic to sulfa antibiotics.  MEDICATIONS:  Current Outpatient Medications  Medication Sig Dispense Refill  . Ascorbic Acid (VITAMIN C) 1000 MG tablet Take 500 mg by mouth 2 (two) times daily.     Marland Kitchen aspirin 81 MG tablet Take 81 mg by mouth daily.    . BELSOMRA 10 MG TABS Take 1 tablet by mouth at bedtime.  5  . Calcium Carbonate (CALTRATE 600 PO) Take 1 tablet by mouth 2 (two) times daily.    . cholecalciferol (VITAMIN D) 1000 UNITS tablet Take 1,000 Units by mouth daily.    . citalopram (CELEXA) 20 MG tablet Take 20 mg by mouth daily.    . cyanocobalamin (,VITAMIN B-12,) 1000 MCG/ML injection Inject 1,000 mcg into the muscle every 30 (thirty) days.     . Fe Fum-FA-B Cmp-C-Zn-Mg-Mn-Cu (HEMATINIC PLUS VIT/MINERALS) 106-1 MG TABS Take 1 tablet by mouth daily.  3  . gabapentin (NEURONTIN) 100 MG capsule Take 100-200 mg by mouth 2 (two) times daily. 100mg  in the  AM and 200mg  in the PM    . meloxicam (MOBIC) 7.5 MG tablet Take 7.5 mg by  mouth daily.    . mirtazapine (REMERON) 30 MG tablet Take 30 mg by mouth at bedtime.    . Multiple Vitamins-Minerals (PRESERVISION AREDS) TABS Take 1 tablet by mouth 2 (two) times daily.     . pantoprazole (PROTONIX) 40 MG tablet TAKE 1 TABLET BY MOUTH DAILY 1 HOUR BEFORE MEALS  1  . Teriparatide, Recombinant, (FORTEO [place]) Inject 600 mcg into the skin every morning. For 2 years.    . travoprost, benzalkonium, (TRAVATAN) 0.004 % ophthalmic solution Place 1 drop into both eyes at bedtime.     No current facility-administered medications for this visit.     PHYSICAL EXAMINATION: ECOG PERFORMANCE STATUS: 0 - Asymptomatic  BP 133/82 (BP Location: Left Arm, Patient Position: Sitting)   Pulse 65   Resp 16   Wt 93 lb (42.2 kg)   BMI 18.16 kg/m   Filed Weights   11/06/17 0928  Weight: 93 lb (42.2 kg)    GENERAL: Well-nourished well-developed; Alert, no distress and comfortable.   Alone. EYES: no pallor or icterus OROPHARYNX: no thrush or ulceration; good dentition  NECK: supple, no masses felt LYMPH:  no palpable lymphadenopathy in the cervical, axillary or inguinal regions LUNGS: clear to auscultation and  No wheeze or crackles HEART/CVS: regular rate & rhythm and no murmurs; No lower extremity edema ABDOMEN:abdomen soft, non-tender and normal bowel sounds Musculoskeletal:no cyanosis of digits and no clubbing  PSYCH: alert & oriented x 3 with fluent speech NEURO: no focal motor/sensory deficits SKIN:  no rashes or significant lesions  LABORATORY DATA:  I have reviewed the data as listed    Component Value Date/Time   NA 140 11/03/2017 0902   NA 142 02/09/2013 0515   K 4.9 11/03/2017 0902   K 4.0 02/09/2013 0515   CL 105 11/03/2017 0902   CL 113 (H) 02/09/2013 0515   CO2 26 11/03/2017 0902   CO2 25 02/09/2013 0515   GLUCOSE 101 (H) 11/03/2017 0902   GLUCOSE 80 02/09/2013 0515   BUN 23 (H) 11/03/2017 0902   BUN 20 (H) 02/09/2013 0515   CREATININE 0.87 11/03/2017 0902    CREATININE 0.58 (L) 02/09/2013 0515   CALCIUM 9.1 11/03/2017 0902   CALCIUM 7.5 (L) 02/09/2013 0515   PROT 6.6 07/08/2016 0737   PROT 6.8 02/08/2013 1558   ALBUMIN 3.6 07/08/2016 0737   ALBUMIN 3.8 02/08/2013 1558   AST 28 07/08/2016 0737   AST 16 02/08/2013 1558   ALT 21 07/08/2016 0737   ALT 12 02/08/2013 1558   ALKPHOS 60 07/08/2016 0737   ALKPHOS 53 02/08/2013 1558   BILITOT 1.2 07/08/2016 0737   BILITOT 0.2 02/08/2013 1558   GFRNONAA >60 11/03/2017 0902   GFRNONAA >60 02/09/2013 0515   GFRAA >  60 11/03/2017 0902   GFRAA >60 02/09/2013 0515    No results found for: SPEP, UPEP  Lab Results  Component Value Date   WBC 8.2 11/03/2017   NEUTROABS 5.8 11/03/2017   HGB 13.9 11/03/2017   HCT 42.8 11/03/2017   MCV 98.8 11/03/2017   PLT 321 11/03/2017      Chemistry      Component Value Date/Time   NA 140 11/03/2017 0902   NA 142 02/09/2013 0515   K 4.9 11/03/2017 0902   K 4.0 02/09/2013 0515   CL 105 11/03/2017 0902   CL 113 (H) 02/09/2013 0515   CO2 26 11/03/2017 0902   CO2 25 02/09/2013 0515   BUN 23 (H) 11/03/2017 0902   BUN 20 (H) 02/09/2013 0515   CREATININE 0.87 11/03/2017 0902   CREATININE 0.58 (L) 02/09/2013 0515      Component Value Date/Time   CALCIUM 9.1 11/03/2017 0902   CALCIUM 7.5 (L) 02/09/2013 0515   ALKPHOS 60 07/08/2016 0737   ALKPHOS 53 02/08/2013 1558   AST 28 07/08/2016 0737   AST 16 02/08/2013 1558   ALT 21 07/08/2016 0737   ALT 12 02/08/2013 1558   BILITOT 1.2 07/08/2016 0737   BILITOT 0.2 02/08/2013 1558       RADIOGRAPHIC STUDIES: I have personally reviewed the radiological images as listed and agreed with the findings in the report. No results found.   ASSESSMENT & PLAN:  Iron deficiency anemia following bariatric surgery IDA-s/p partial gastrectomy. Today hemoglobin is normal iron studies within normal limits. I would not recommend any IV iron this time.   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   # follow up in 12 months/labs- few days prior.    Orders Placed This Encounter  Procedures  . CBC with Differential/Platelet    Standing Status:   Future    Standing Expiration Date:   11/06/2018  . Comprehensive metabolic panel    Standing Status:   Future    Standing Expiration Date:   11/06/2018  . Iron and TIBC    Standing Status:   Future    Standing Expiration Date:   11/06/2018  . Ferritin    Standing Status:   Future    Standing Expiration Date:   11/06/2018   All questions were answered. The patient knows to call the clinic with any problems, questions or concerns.      Cammie Sickle, MD 11/06/2017 1:43 PM

## 2017-11-06 NOTE — Progress Notes (Signed)
Patient is here today for a follow up. Patient states no new concerns today.  

## 2017-11-09 ENCOUNTER — Ambulatory Visit
Admission: RE | Admit: 2017-11-09 | Discharge: 2017-11-09 | Disposition: A | Discharge status: Home / Self Care | Payer: Medicare Other | Source: Ambulatory Visit | Attending: Family Medicine | Admitting: Family Medicine

## 2017-11-09 DIAGNOSIS — Z1231 Encounter for screening mammogram for malignant neoplasm of breast: Secondary | ICD-10-CM | POA: Diagnosis present

## 2018-04-05 IMAGING — CR DG PELVIS 1-2V
1 series · 1 of 1 positions shown · non-contrast
Comparison: Right femur radiographs obtained at the same time.

CLINICAL DATA: Right leg pain after falling yesterday when she
missed a church step.

EXAM:
PELVIS - 1-2 VIEW

[pelvis ap]
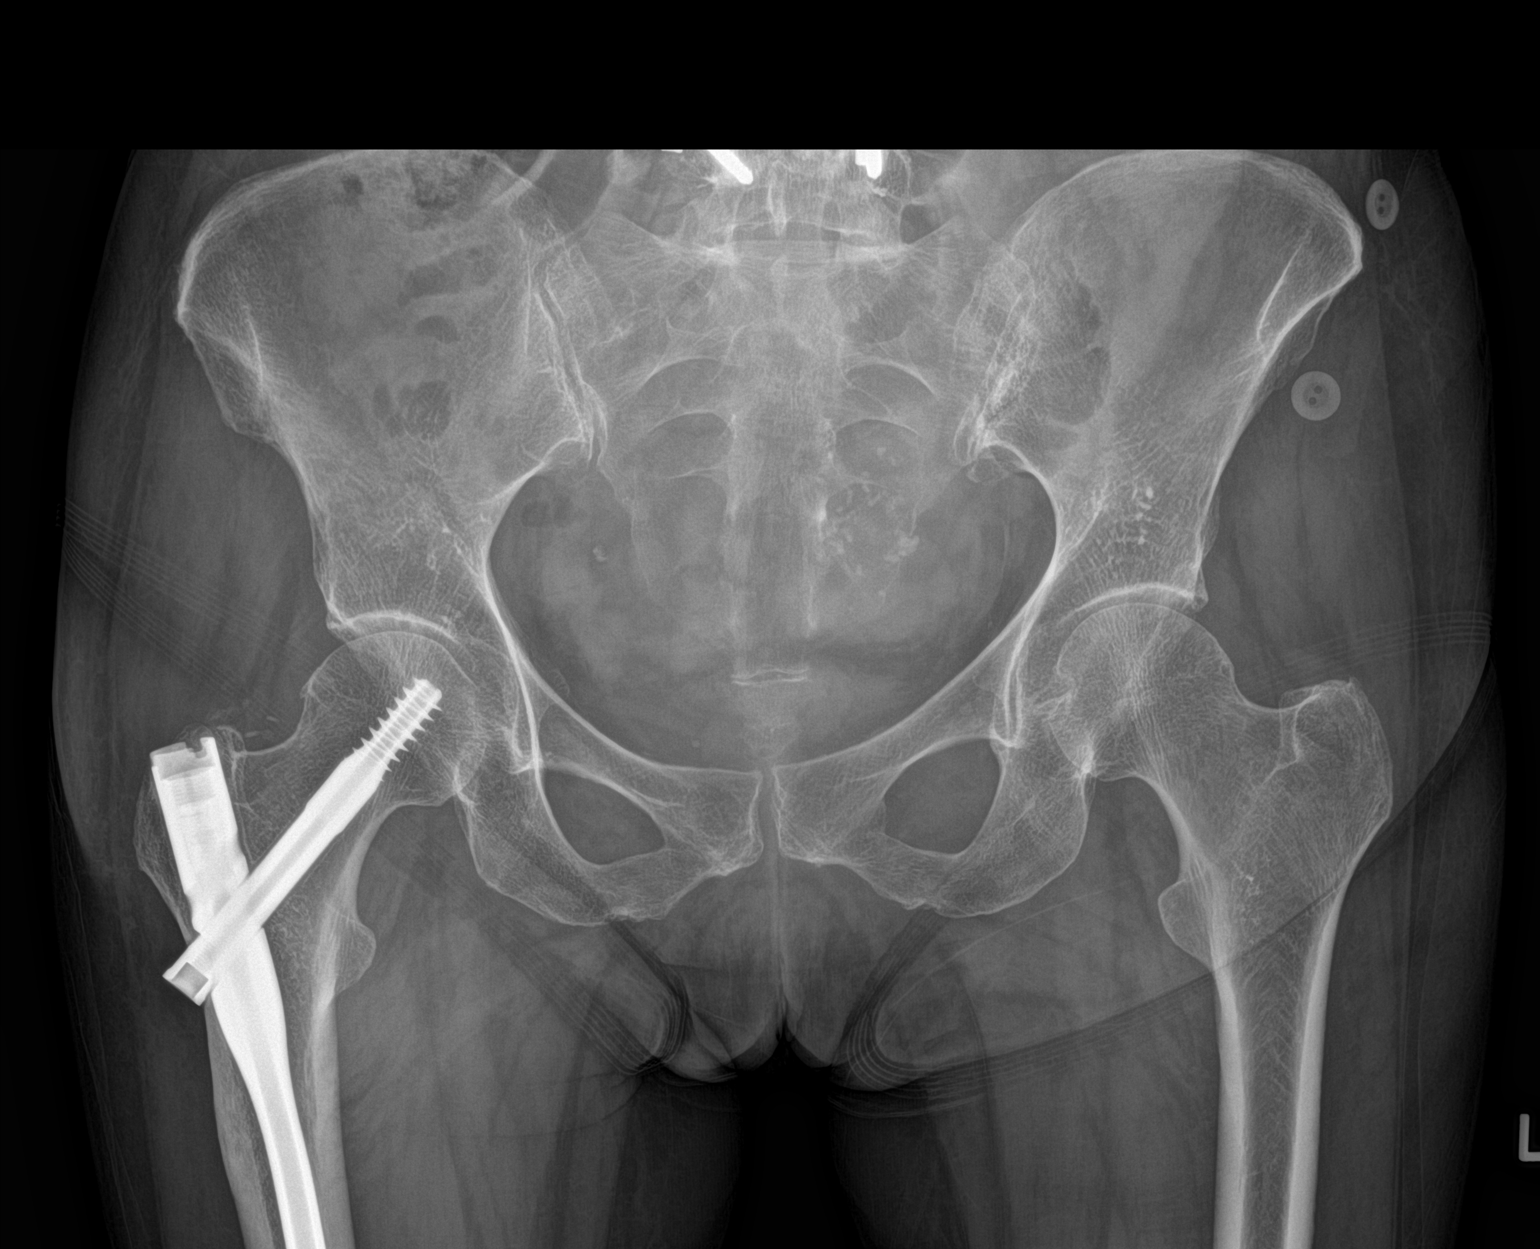

[1 of 1 positions shown; findings below may reference images not displayed]

FINDINGS: Compression screw and rod fixation of the right femur. Interbody
spacer and pedicle screw and rod fixation at the L4-5 level. Diffuse
osteopenia. No fracture or dislocation seen.
IMPRESSION: No pelvic fracture or dislocation.

## 2018-10-04 ENCOUNTER — Other Ambulatory Visit: Payer: Self-pay | Admitting: Family Medicine

## 2018-10-04 DIAGNOSIS — Z1231 Encounter for screening mammogram for malignant neoplasm of breast: Secondary | ICD-10-CM

## 2018-11-01 ENCOUNTER — Inpatient Hospital Stay: Payer: Medicare Other | Attending: Internal Medicine

## 2018-11-01 DIAGNOSIS — Z7982 Long term (current) use of aspirin: Secondary | ICD-10-CM | POA: Diagnosis not present

## 2018-11-01 DIAGNOSIS — Z79899 Other long term (current) drug therapy: Secondary | ICD-10-CM | POA: Diagnosis not present

## 2018-11-01 DIAGNOSIS — D508 Other iron deficiency anemias: Secondary | ICD-10-CM | POA: Diagnosis present

## 2018-11-01 DIAGNOSIS — Z903 Acquired absence of stomach [part of]: Secondary | ICD-10-CM | POA: Insufficient documentation

## 2018-11-01 DIAGNOSIS — R5381 Other malaise: Secondary | ICD-10-CM | POA: Insufficient documentation

## 2018-11-01 DIAGNOSIS — E538 Deficiency of other specified B group vitamins: Secondary | ICD-10-CM | POA: Insufficient documentation

## 2018-11-01 DIAGNOSIS — R5383 Other fatigue: Secondary | ICD-10-CM | POA: Diagnosis not present

## 2018-11-01 DIAGNOSIS — D509 Iron deficiency anemia, unspecified: Secondary | ICD-10-CM

## 2018-11-01 DIAGNOSIS — L930 Discoid lupus erythematosus: Secondary | ICD-10-CM | POA: Insufficient documentation

## 2018-11-01 DIAGNOSIS — Z8711 Personal history of peptic ulcer disease: Secondary | ICD-10-CM | POA: Insufficient documentation

## 2018-11-01 DIAGNOSIS — I10 Essential (primary) hypertension: Secondary | ICD-10-CM | POA: Diagnosis not present

## 2018-11-01 DIAGNOSIS — K9589 Other complications of other bariatric procedure: Secondary | ICD-10-CM

## 2018-11-01 LAB — CBC WITH DIFFERENTIAL/PLATELET
Abs Immature Granulocytes: 0.03 10*3/uL (ref 0.00–0.07)
Basophils Absolute: 0.1 10*3/uL (ref 0.0–0.1)
Basophils Relative: 1 %
Eosinophils Absolute: 0.2 10*3/uL (ref 0.0–0.5)
Eosinophils Relative: 2 %
HCT: 42.4 % (ref 36.0–46.0)
Hemoglobin: 12.9 g/dL (ref 12.0–15.0)
IMMATURE GRANULOCYTES: 0 %
Lymphocytes Relative: 21 %
Lymphs Abs: 1.6 10*3/uL (ref 0.7–4.0)
MCH: 29.7 pg (ref 26.0–34.0)
MCHC: 30.4 g/dL (ref 30.0–36.0)
MCV: 97.7 fL (ref 80.0–100.0)
MONOS PCT: 7 %
Monocytes Absolute: 0.5 10*3/uL (ref 0.1–1.0)
Neutro Abs: 5.3 10*3/uL (ref 1.7–7.7)
Neutrophils Relative %: 69 %
Platelets: 296 10*3/uL (ref 150–400)
RBC: 4.34 MIL/uL (ref 3.87–5.11)
RDW: 13.8 % (ref 11.5–15.5)
WBC: 7.7 10*3/uL (ref 4.0–10.5)
nRBC: 0 % (ref 0.0–0.2)

## 2018-11-01 LAB — COMPREHENSIVE METABOLIC PANEL
ALT: 17 U/L (ref 0–44)
AST: 22 U/L (ref 15–41)
Albumin: 4.1 g/dL (ref 3.5–5.0)
Alkaline Phosphatase: 55 U/L (ref 38–126)
Anion gap: 7 (ref 5–15)
BUN: 15 mg/dL (ref 8–23)
CHLORIDE: 111 mmol/L (ref 98–111)
CO2: 26 mmol/L (ref 22–32)
CREATININE: 0.8 mg/dL (ref 0.44–1.00)
Calcium: 8.5 mg/dL — ABNORMAL LOW (ref 8.9–10.3)
GFR calc Af Amer: >60 mL/min (ref >60)
GFR calc non Af Amer: >60 mL/min (ref >60)
Glucose, Bld: 98 mg/dL (ref 70–99)
Potassium: 4.7 mmol/L (ref 3.5–5.1)
Sodium: 144 mmol/L (ref 135–145)
Total Bilirubin: 0.4 mg/dL (ref 0.3–1.2)
Total Protein: 6.6 g/dL (ref 6.5–8.1)

## 2018-11-01 LAB — IRON AND TIBC
Iron: 48 ug/dL (ref 28–170)
SATURATION RATIOS: 13 % (ref 10.4–31.8)
TIBC: 369 ug/dL (ref 250–450)
UIBC: 321 ug/dL

## 2018-11-01 LAB — FERRITIN: FERRITIN: 13 ng/mL (ref 11–307)

## 2018-11-02 ENCOUNTER — Other Ambulatory Visit: Payer: Self-pay | Admitting: Internal Medicine

## 2018-11-08 ENCOUNTER — Inpatient Hospital Stay (HOSPITAL_BASED_OUTPATIENT_CLINIC_OR_DEPARTMENT_OTHER): Payer: Medicare Other | Admitting: Internal Medicine

## 2018-11-08 ENCOUNTER — Other Ambulatory Visit: Payer: Self-pay

## 2018-11-08 ENCOUNTER — Inpatient Hospital Stay: Payer: Medicare Other

## 2018-11-08 ENCOUNTER — Encounter: Payer: Self-pay | Admitting: Internal Medicine

## 2018-11-08 VITALS — BP 123/77 | HR 71 | Temp 97.5°F | Resp 18

## 2018-11-08 VITALS — BP 133/90 | HR 76 | Temp 97.6°F | Resp 20 | Ht 59.0 in | Wt 85.0 lb

## 2018-11-08 DIAGNOSIS — Z903 Acquired absence of stomach [part of]: Secondary | ICD-10-CM

## 2018-11-08 DIAGNOSIS — D508 Other iron deficiency anemias: Secondary | ICD-10-CM

## 2018-11-08 DIAGNOSIS — Z7982 Long term (current) use of aspirin: Secondary | ICD-10-CM

## 2018-11-08 DIAGNOSIS — K9589 Other complications of other bariatric procedure: Secondary | ICD-10-CM

## 2018-11-08 DIAGNOSIS — I10 Essential (primary) hypertension: Secondary | ICD-10-CM

## 2018-11-08 DIAGNOSIS — E538 Deficiency of other specified B group vitamins: Secondary | ICD-10-CM | POA: Diagnosis not present

## 2018-11-08 DIAGNOSIS — Z79899 Other long term (current) drug therapy: Secondary | ICD-10-CM

## 2018-11-08 DIAGNOSIS — D509 Iron deficiency anemia, unspecified: Secondary | ICD-10-CM

## 2018-11-08 DIAGNOSIS — R5381 Other malaise: Secondary | ICD-10-CM

## 2018-11-08 DIAGNOSIS — L930 Discoid lupus erythematosus: Secondary | ICD-10-CM

## 2018-11-08 DIAGNOSIS — R5383 Other fatigue: Secondary | ICD-10-CM

## 2018-11-08 DIAGNOSIS — Z8711 Personal history of peptic ulcer disease: Secondary | ICD-10-CM | POA: Diagnosis not present

## 2018-11-08 MED ORDER — IRON SUCROSE 20 MG/ML IV SOLN
200.0000 mg | Freq: Once | INTRAVENOUS | Status: AC
  Administered 2018-11-08: 200 mg via INTRAVENOUS
  Filled 2018-11-08: qty 10

## 2018-11-08 MED ORDER — SODIUM CHLORIDE 0.9 % IV SOLN
Freq: Once | INTRAVENOUS | Status: AC
  Administered 2018-11-08: 12:00:00 via INTRAVENOUS
  Filled 2018-11-08: qty 250

## 2018-11-08 NOTE — Assessment & Plan Note (Addendum)
IDA-s/p partial gastrectomy. Today hemoglobin is normal;  iron studies -saturation 13% ferritin 13% but fatiguued- proceed with IV iron.   # B12 deficiency secondary to surgery- continue parental B12 with PCP stable.  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  # Diarrea- oct 2019-resolved however weight lost 8 pounds- ?  Etiology.  Recommend protein shakes; if worse to inform us; clinically less likely malignancy.  # DISPOSITION: # Venofer today.  #  follow up in 12 months/labs-cbc/bmp/iron studies/ferritin- few days prior.

## 2018-11-08 NOTE — Progress Notes (Signed)
[place] OFFICE PROGRESS NOTE  Patient Care Team: [name], MD as PCP - General (Family Medicine)  Cancer Staging No matching staging information was found for the patient.    No history exists.  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 Patient also intolerant of oral iron  -   received parenteral iron therapy with Feraheme 510 mg x 2 doses in May 2014.   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 I reviewed the patient's prior charts/pertinent labs/imaging in detail; findings are summarized above.     INTERVAL HISTORY:  Renee Cabrera 78 y.o.  female pleasant patient above history of Iron deficiency anemia status post partial gastrectomy in 1960s is here for follow-up.  Patient denies any blood in stools black or stools.  Stated that she had episode of diarrhea that lasted for about a week or so in October 2019.  Since resolved.  She lost about 5 to 6 pounds at the time of the episode.  Patient having difficulty gaining the weight back.  Denies any pain.  Denies any nausea vomiting. No smoking. Review of Systems  Constitutional: Positive for malaise/fatigue. Negative for chills, diaphoresis, fever and weight loss.  HENT: Negative for nosebleeds and sore throat.   Eyes: Negative for double vision.  Respiratory: Negative for cough, hemoptysis, sputum production, shortness of breath and wheezing.   Cardiovascular: Negative for chest pain, palpitations, orthopnea and leg swelling.  Gastrointestinal: Negative for abdominal pain, blood in stool, constipation, diarrhea, heartburn, melena, nausea and vomiting.  Genitourinary: Negative for dysuria, frequency and urgency.  Musculoskeletal: Negative for back pain and joint pain.  Skin: Negative.  Negative for itching and rash.   Neurological: Negative for dizziness, tingling, focal weakness, weakness and headaches.  Endo/Heme/Allergies: Does not bruise/bleed easily.  Psychiatric/Behavioral: Negative for depression. The patient is not nervous/anxious and does not have insomnia.      PAST MEDICAL HISTORY :  Past Medical History:  Diagnosis Date  . Anemia   . Arthritis    osteoarthritis/ RA  . Asthma    remote h/o asthma  . Depression    controlled  . H/O hypoglycemia   . H/O: GI bleed   . Headache(784.0)    h/o migraines  . Hearing loss, bilateral    wears hearing aides  . History of blood transfusion    pt states she had yellow jaundice  . Hypertension   . Lupus (systemic lupus erythematosus) ([place]) 1995   in remission since 2005  . Nasal sinus congestion   . Osteoporosis with fracture   . Stress fracture of foot    left foot  . Vitamin B 12 deficiency     PAST SURGICAL HISTORY :   Past Surgical History:  Procedure Laterality Date  . ANTERIOR CERVICAL DECOMP/DISCECTOMY FUSION  2009  . CATARACT EXTRACTION W/PHACO Left 09/30/2016   Procedure: CATARACT EXTRACTION PHACO AND INTRAOCULAR LENS PLACEMENT (IOC);  Surgeon: Birder Robson, MD;  Location: [facility] ORS;  Service: Ophthalmology;  Laterality: Left;  Lot# [id number] H [place]: 00:43.6 AP%: 19.6 CDE: 8.55  . INTRAMEDULLARY (IM) NAIL INTERTROCHANTERIC Right 07/01/2016   Procedure: INTRAMEDULLARY (IM) NAIL EXCHANGE OF FEMORAL ROD;  Surgeon: Hessie Knows, MD;  Location: [facility] ORS;  Service: Orthopedics;  Laterality: Right;  . ORIF FEMUR FRACTURE  2012  . PARTIAL GASTRECTOMY  1963   and reconstruction  47  . TONSILLECTOMY  1945    FAMILY HISTORY :   Family History  Problem Relation Age of Onset  . Breast cancer Neg Hx     SOCIAL HISTORY:   Social History   Tobacco Use  . Smoking status: Never Smoker  . Smokeless tobacco: Never Used  Substance Use Topics  . Alcohol use: No  . Drug use: No    ALLERGIES:  is allergic to sulfa  antibiotics.  MEDICATIONS:  Current Outpatient Medications  Medication Sig Dispense Refill  . Ascorbic Acid (VITAMIN C) 1000 MG tablet Take 500 mg by mouth 2 (two) times daily.     Marland Kitchen aspirin 81 MG tablet Take 81 mg by mouth daily.    . BELSOMRA 10 MG TABS Take 1 tablet by mouth at bedtime.  5  . Calcium Carbonate (CALTRATE 600 PO) Take 1 tablet by mouth 2 (two) times daily.    . cholecalciferol (VITAMIN D) 1000 UNITS tablet Take 1,000 Units by mouth daily.    . citalopram (CELEXA) 20 MG tablet Take 20 mg by mouth daily.    . cyanocobalamin (,VITAMIN B-12,) 1000 MCG/ML injection Inject 1,000 mcg into the muscle every 30 (thirty) days.     Marland Kitchen denosumab (PROLIA) 60 MG/ML SOSY injection Inject 1 mL into the skin every 6 (six) months.    . Fe Fum-FA-B Cmp-C-Zn-Mg-Mn-Cu (HEMATINIC PLUS VIT/MINERALS) 106-1 MG TABS Take 1 tablet by mouth daily.  3  . gabapentin (NEURONTIN) 100 MG capsule Take 100-200 mg by mouth 2 (two) times daily. 100mg  in the  AM and 200mg  in the PM    . meloxicam (MOBIC) 7.5 MG tablet Take 7.5 mg by mouth daily.    . mirtazapine (REMERON) 30 MG tablet Take 30 mg by mouth at bedtime.    . Multiple Vitamins-Minerals (PRESERVISION AREDS) TABS Take 1 tablet by mouth 2 (two) times daily.     . pantoprazole (PROTONIX) 40 MG tablet TAKE 1 TABLET BY MOUTH DAILY 1 HOUR BEFORE MEALS  1  . travoprost, benzalkonium, (TRAVATAN) 0.004 % ophthalmic solution Place 1 drop into both eyes at bedtime.     No current facility-administered medications for this visit.     PHYSICAL EXAMINATION: ECOG PERFORMANCE STATUS: 0 - Asymptomatic  BP 133/90 (BP Location: Left Arm, Patient Position: Sitting)   Pulse 76   Temp 97.6 F (36.4 C) (Tympanic)   Resp 20   Ht 4\' 11"  (1.499 m)   Wt 85 lb (38.6 kg)   BMI 17.17 kg/m   Filed Weights   11/08/18 1122  Weight: 85 lb (38.6 kg)    Physical Exam  Constitutional: She is oriented to person, place, and time and well-developed, well-nourished, and in  no distress.  HENT:  Head: Normocephalic and atraumatic.  Mouth/Throat: Oropharynx is clear and moist. No oropharyngeal exudate.  Eyes: Pupils are equal, round, and reactive to light.  Neck: Normal range of motion. Neck supple.  Cardiovascular: Normal rate and regular rhythm.  Pulmonary/Chest: No respiratory distress. She has no wheezes.  Abdominal: Soft. Bowel sounds are normal. She exhibits no distension and no mass. There is no abdominal tenderness. There is no rebound and no guarding.  Musculoskeletal: Normal range of motion.        General: No tenderness or edema.  Neurological: She is alert and oriented to person, place, and time.  Skin: Skin is warm.  Psychiatric: Affect normal.     LABORATORY DATA:  I have reviewed the data as listed  Component Value Date/Time   NA 144 11/01/2018 1121   NA 142 02/09/2013 0515   K 4.7 11/01/2018 1121   K 4.0 02/09/2013 0515   CL 111 11/01/2018 1121   CL 113 (H) 02/09/2013 0515   CO2 26 11/01/2018 1121   CO2 25 02/09/2013 0515   GLUCOSE 98 11/01/2018 1121   GLUCOSE 80 02/09/2013 0515   BUN 15 11/01/2018 1121   BUN 20 (H) 02/09/2013 0515   CREATININE 0.80 11/01/2018 1121   CREATININE 0.58 (L) 02/09/2013 0515   CALCIUM 8.5 (L) 11/01/2018 1121   CALCIUM 7.5 (L) 02/09/2013 0515   PROT 6.6 11/01/2018 1121   PROT 6.8 02/08/2013 1558   ALBUMIN 4.1 11/01/2018 1121   ALBUMIN 3.8 02/08/2013 1558   AST 22 11/01/2018 1121   AST 16 02/08/2013 1558   ALT 17 11/01/2018 1121   ALT 12 02/08/2013 1558   ALKPHOS 55 11/01/2018 1121   ALKPHOS 53 02/08/2013 1558   BILITOT 0.4 11/01/2018 1121   BILITOT 0.2 02/08/2013 1558   GFRNONAA >60 11/01/2018 1121   GFRNONAA >60 02/09/2013 0515   GFRAA >60 11/01/2018 1121   GFRAA >60 02/09/2013 0515    No results found for: SPEP, UPEP  Lab Results  Component Value Date   WBC 7.7 11/01/2018   NEUTROABS 5.3 11/01/2018   HGB 12.9 11/01/2018   HCT 42.4 11/01/2018   MCV 97.7 11/01/2018   PLT 296  11/01/2018      Chemistry      Component Value Date/Time   NA 144 11/01/2018 1121   NA 142 02/09/2013 0515   K 4.7 11/01/2018 1121   K 4.0 02/09/2013 0515   CL 111 11/01/2018 1121   CL 113 (H) 02/09/2013 0515   CO2 26 11/01/2018 1121   CO2 25 02/09/2013 0515   BUN 15 11/01/2018 1121   BUN 20 (H) 02/09/2013 0515   CREATININE 0.80 11/01/2018 1121   CREATININE 0.58 (L) 02/09/2013 0515      Component Value Date/Time   CALCIUM 8.5 (L) 11/01/2018 1121   CALCIUM 7.5 (L) 02/09/2013 0515   ALKPHOS 55 11/01/2018 1121   ALKPHOS 53 02/08/2013 1558   AST 22 11/01/2018 1121   AST 16 02/08/2013 1558   ALT 17 11/01/2018 1121   ALT 12 02/08/2013 1558   BILITOT 0.4 11/01/2018 1121   BILITOT 0.2 02/08/2013 1558       RADIOGRAPHIC STUDIES: I have personally reviewed the radiological images as listed and agreed with the findings in the report. No results found.   ASSESSMENT & PLAN:  Iron deficiency anemia following bariatric surgery IDA-s/p partial gastrectomy. Today hemoglobin is normal;  iron studies -saturation 13% ferritin 13% but fatiguued- proceed with IV iron.   # B12 deficiency secondary to surgery- continue parental B12 with PCP stable.  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  # Diarrea- oct 2019-resolved however weight lost 8 pounds- ?  Etiology.  Recommend protein shakes; if worse to inform us; clinically less likely malignancy.  # DISPOSITION: # Venofer today.  #  follow up in 12 months/labs-cbc/bmp/iron studies/ferritin- few days prior.    No orders of the defined types were placed in this encounter.  All questions were answered. The patient knows to call the clinic with any problems, questions or concerns.      Cammie Sickle, MD 11/08/2018 1:21 PM

## 2018-11-10 ENCOUNTER — Ambulatory Visit
Admission: RE | Admit: 2018-11-10 | Discharge: 2018-11-10 | Disposition: A | Discharge status: Home / Self Care | Payer: Medicare Other | Source: Ambulatory Visit | Attending: Family Medicine | Admitting: Family Medicine

## 2018-11-10 DIAGNOSIS — Z1231 Encounter for screening mammogram for malignant neoplasm of breast: Secondary | ICD-10-CM | POA: Diagnosis not present

## 2018-12-23 ENCOUNTER — Encounter: Payer: Self-pay | Admitting: *Deleted

## 2019-01-11 ENCOUNTER — Other Ambulatory Visit: Payer: Self-pay

## 2019-01-11 ENCOUNTER — Ambulatory Visit
Admission: RE | Admit: 2019-01-11 | Discharge: 2019-01-11 | Disposition: A | Discharge status: Home / Self Care | Payer: Medicare Other | Attending: Ophthalmology | Admitting: Ophthalmology

## 2019-01-11 ENCOUNTER — Ambulatory Visit: Payer: Medicare Other | Admitting: Certified Registered Nurse Anesthetist

## 2019-01-11 ENCOUNTER — Encounter
Admission: RE | Disposition: A | Discharge status: Home / Self Care | Payer: Self-pay | Source: Home / Self Care | Attending: Ophthalmology

## 2019-01-11 ENCOUNTER — Encounter: Payer: Self-pay | Admitting: *Deleted

## 2019-01-11 DIAGNOSIS — Z791 Long term (current) use of non-steroidal anti-inflammatories (NSAID): Secondary | ICD-10-CM | POA: Diagnosis not present

## 2019-01-11 DIAGNOSIS — H2511 Age-related nuclear cataract, right eye: Secondary | ICD-10-CM | POA: Insufficient documentation

## 2019-01-11 DIAGNOSIS — J45909 Unspecified asthma, uncomplicated: Secondary | ICD-10-CM | POA: Diagnosis not present

## 2019-01-11 DIAGNOSIS — Z7983 Long term (current) use of bisphosphonates: Secondary | ICD-10-CM | POA: Diagnosis not present

## 2019-01-11 DIAGNOSIS — E1136 Type 2 diabetes mellitus with diabetic cataract: Secondary | ICD-10-CM | POA: Diagnosis not present

## 2019-01-11 DIAGNOSIS — Z79899 Other long term (current) drug therapy: Secondary | ICD-10-CM | POA: Insufficient documentation

## 2019-01-11 DIAGNOSIS — Z882 Allergy status to sulfonamides status: Secondary | ICD-10-CM | POA: Diagnosis not present

## 2019-01-11 DIAGNOSIS — I10 Essential (primary) hypertension: Secondary | ICD-10-CM | POA: Diagnosis not present

## 2019-01-11 DIAGNOSIS — M810 Age-related osteoporosis without current pathological fracture: Secondary | ICD-10-CM | POA: Diagnosis not present

## 2019-01-11 DIAGNOSIS — Z7982 Long term (current) use of aspirin: Secondary | ICD-10-CM | POA: Diagnosis not present

## 2019-01-11 DIAGNOSIS — F329 Major depressive disorder, single episode, unspecified: Secondary | ICD-10-CM | POA: Insufficient documentation

## 2019-01-11 DIAGNOSIS — M1990 Unspecified osteoarthritis, unspecified site: Secondary | ICD-10-CM | POA: Insufficient documentation

## 2019-01-11 DIAGNOSIS — Z79891 Long term (current) use of opiate analgesic: Secondary | ICD-10-CM | POA: Insufficient documentation

## 2019-01-11 HISTORY — PX: CATARACT EXTRACTION W/PHACO: SHX586

## 2019-01-11 SURGERY — PHACOEMULSIFICATION, CATARACT, WITH IOL INSERTION
Anesthesia: Monitor Anesthesia Care | Site: Eye | Laterality: Right

## 2019-01-11 MED ORDER — ARMC OPHTHALMIC DILATING DROPS
1.0000 "application " | OPHTHALMIC | Status: AC
  Administered 2019-01-11 (×3): 1 via OPHTHALMIC

## 2019-01-11 MED ORDER — POVIDONE-IODINE 5 % OP SOLN
OPHTHALMIC | Status: AC
  Filled 2019-01-11: qty 30

## 2019-01-11 MED ORDER — LIDOCAINE HCL (PF) 4 % IJ SOLN
INTRAMUSCULAR | Status: AC
  Filled 2019-01-11: qty 5

## 2019-01-11 MED ORDER — EPINEPHRINE PF 1 MG/ML IJ SOLN
INTRAMUSCULAR | Status: AC
  Filled 2019-01-11: qty 2

## 2019-01-11 MED ORDER — POVIDONE-IODINE 5 % OP SOLN
OPHTHALMIC | Status: DC | PRN
  Administered 2019-01-11: 1 via OPHTHALMIC

## 2019-01-11 MED ORDER — TETRACAINE HCL 0.5 % OP SOLN
OPHTHALMIC | Status: AC
  Administered 2019-01-11: 1 [drp] via OPHTHALMIC
  Filled 2019-01-11: qty 4

## 2019-01-11 MED ORDER — FENTANYL CITRATE (PF) 100 MCG/2ML IJ SOLN: 25.0000 ug | INTRAMUSCULAR | Status: DC | PRN

## 2019-01-11 MED ORDER — TETRACAINE HCL 0.5 % OP SOLN
1.0000 [drp] | OPHTHALMIC | Status: DC | PRN
  Administered 2019-01-11: 1 [drp] via OPHTHALMIC

## 2019-01-11 MED ORDER — MOXIFLOXACIN HCL 0.5 % OP SOLN
OPHTHALMIC | Status: AC
  Filled 2019-01-11: qty 3

## 2019-01-11 MED ORDER — SODIUM CHLORIDE 0.9 % IV SOLN
INTRAVENOUS | Status: DC
  Administered 2019-01-11: 08:00:00 via INTRAVENOUS

## 2019-01-11 MED ORDER — FENTANYL CITRATE (PF) 100 MCG/2ML IJ SOLN
INTRAMUSCULAR | Status: AC
  Filled 2019-01-11: qty 2

## 2019-01-11 MED ORDER — MOXIFLOXACIN HCL 0.5 % OP SOLN
OPHTHALMIC | Status: DC | PRN
  Administered 2019-01-11: 0.2 mL via OPHTHALMIC

## 2019-01-11 MED ORDER — NA CHONDROIT SULF-NA HYALURON 40-17 MG/ML IO SOLN
INTRAOCULAR | Status: AC
  Filled 2019-01-11: qty 1

## 2019-01-11 MED ORDER — ONDANSETRON HCL 4 MG/2ML IJ SOLN: 4.0000 mg | Freq: Once | INTRAMUSCULAR | Status: DC | PRN

## 2019-01-11 MED ORDER — LIDOCAINE HCL (PF) 4 % IJ SOLN
INTRAOCULAR | Status: DC | PRN
  Administered 2019-01-11: 4 mL via OPHTHALMIC

## 2019-01-11 MED ORDER — MOXIFLOXACIN HCL 0.5 % OP SOLN: 1.0000 [drp] | OPHTHALMIC | Status: DC | PRN

## 2019-01-11 MED ORDER — NA CHONDROIT SULF-NA HYALURON 40-17 MG/ML IO SOLN
INTRAOCULAR | Status: DC | PRN
  Administered 2019-01-11: 1 mL via INTRAOCULAR

## 2019-01-11 MED ORDER — FENTANYL CITRATE (PF) 100 MCG/2ML IJ SOLN
INTRAMUSCULAR | Status: DC | PRN
  Administered 2019-01-11: 25 ug via INTRAVENOUS

## 2019-01-11 MED ORDER — ARMC OPHTHALMIC DILATING DROPS
OPHTHALMIC | Status: AC
  Administered 2019-01-11: 1 via OPHTHALMIC
  Filled 2019-01-11: qty 0.5

## 2019-01-11 MED ORDER — CARBACHOL 0.01 % IO SOLN
INTRAOCULAR | Status: DC | PRN
  Administered 2019-01-11: 0.5 mL via INTRAOCULAR

## 2019-01-11 MED ORDER — EPINEPHRINE PF 1 MG/ML IJ SOLN
INTRAOCULAR | Status: DC | PRN
  Administered 2019-01-11: 09:00:00 via OPHTHALMIC

## 2019-01-11 SURGICAL SUPPLY — 16 items
GLOVE BIO SURGEON STRL SZ8 (GLOVE) ×3 IMPLANT
GLOVE BIOGEL M 6.5 STRL (GLOVE) ×3 IMPLANT
GLOVE SURG LX 8.0 MICRO (GLOVE) ×2
GLOVE SURG LX STRL 8.0 MICRO (GLOVE) ×1 IMPLANT
GOWN STRL REUS W/ TWL LRG LVL3 (GOWN DISPOSABLE) ×2 IMPLANT
GOWN STRL REUS W/TWL LRG LVL3 (GOWN DISPOSABLE) ×4
LABEL CATARACT MEDS ST (LABEL) ×3 IMPLANT
LENS IOL TECNIS ITEC 22.0 (Intraocular Lens) ×2 IMPLANT
PACK CATARACT (MISCELLANEOUS) ×3 IMPLANT
PACK CATARACT BRASINGTON LX (MISCELLANEOUS) ×3 IMPLANT
PACK EYE AFTER SURG (MISCELLANEOUS) ×3 IMPLANT
SOL BSS BAG (MISCELLANEOUS) ×3
SOLUTION BSS BAG (MISCELLANEOUS) ×1 IMPLANT
SYR 5ML LL (SYRINGE) ×3 IMPLANT
WATER STERILE IRR 250ML POUR (IV SOLUTION) ×3 IMPLANT
WIPE NON LINTING 3.25X3.25 (MISCELLANEOUS) ×3 IMPLANT

## 2019-01-11 NOTE — Anesthesia Procedure Notes (Signed)
Procedure Name: MAC Performed by: Demetrius Charity, CRNA Pre-anesthesia Checklist: Patient identified, Emergency Drugs available, Suction available, Timeout performed and Patient being monitored Oxygen Delivery Method: Nasal cannula

## 2019-01-11 NOTE — Op Note (Signed)
PREOPERATIVE DIAGNOSIS:  Nuclear sclerotic cataract of the right eye.   POSTOPERATIVE DIAGNOSIS:  NUCLEAR SCLEROTIC CATARACT RIGHT EYE   OPERATIVE PROCEDURE: Procedure(s): CATARACT EXTRACTION PHACO AND INTRAOCULAR LENS PLACEMENT (IOC) RIGHT   SURGEON:  Birder Robson, MD.   ANESTHESIA:  Anesthesiologist: Molli Barrows, MD CRNA: Demetrius Charity, CRNA  1.      Managed anesthesia care. 2.      0.62ml of Shugarcaine was instilled in the eye following the paracentesis.   COMPLICATIONS:  None.   TECHNIQUE:   Stop and chop   DESCRIPTION OF PROCEDURE:  The patient was examined and consented in the preoperative holding area where the aforementioned topical anesthesia was applied to the right eye and then brought back to the Operating Room where the right eye was prepped and draped in the usual sterile ophthalmic fashion and a lid speculum was placed. A paracentesis was created with the side port blade and the anterior chamber was filled with viscoelastic. A near clear corneal incision was performed with the steel keratome. A continuous curvilinear capsulorrhexis was performed with a cystotome followed by the capsulorrhexis forceps. Hydrodissection and hydrodelineation were carried out with BSS on a blunt cannula. The lens was removed in a stop and chop  technique and the remaining cortical material was removed with the irrigation-aspiration handpiece. The capsular bag was inflated with viscoelastic and the Technis [id number]  lens was placed in the capsular bag without complication. The remaining viscoelastic was removed from the eye with the irrigation-aspiration handpiece. The wounds were hydrated. The anterior chamber was flushed with Miostat and the eye was inflated to physiologic pressure. 0.70ml of Vigamox was placed in the anterior chamber. The wounds were found to be water tight. The eye was dressed with Vigamox. The patient was given protective glasses to wear throughout the day and a shield with  which to sleep tonight. The patient was also given drops with which to begin a drop regimen today and will follow-up with me in one day. Implant Name Type Inv. Item Serial No. Manufacturer Lot No. LRB No. Used  LENS IOL DIOP 22.0 - [id number] Intraocular Lens LENS IOL DIOP 22.0 [id number]  Right 1   Procedure(s) with comments: CATARACT EXTRACTION PHACO AND INTRAOCULAR LENS PLACEMENT (IOC) RIGHT (Right) - [place] 01:03.9 CDE 9.19 Fluid pack Lot # [id number]  Electronically signed: Birder Robson 01/11/2019 8:57 AM

## 2019-01-11 NOTE — Anesthesia Post-op Follow-up Note (Signed)
Anesthesia QCDR form completed.        

## 2019-01-11 NOTE — Anesthesia Postprocedure Evaluation (Signed)
Anesthesia Post Note  Patient: Renee Cabrera  Procedure(s) Performed: CATARACT EXTRACTION PHACO AND INTRAOCULAR LENS PLACEMENT (IOC) RIGHT (Right Eye)  Patient location during evaluation: PACU Anesthesia Type: MAC Level of consciousness: awake and alert Pain management: pain level controlled Vital Signs Assessment: post-procedure vital signs reviewed and stable Respiratory status: spontaneous breathing, nonlabored ventilation, respiratory function stable and patient connected to nasal cannula oxygen Cardiovascular status: blood pressure returned to baseline and stable Postop Assessment: no apparent nausea or vomiting Anesthetic complications: no     Last Vitals:  Vitals:   01/11/19 0745  BP: (!) 161/76  Pulse: 76  Resp: 18  Temp: 36.5 C  SpO2: 99%    Last Pain:  Vitals:   01/11/19 0745  TempSrc: Tympanic  PainSc: 0-No pain                 Molli Barrows

## 2019-01-11 NOTE — H&P (Signed)
All labs reviewed. Abnormal studies sent to patients PCP when indicated.  Previous H&P reviewed, patient examined, there are NO CHANGES.  Renee Aguinaldo Porfilio3/17/20208:34 AM

## 2019-01-11 NOTE — Anesthesia Postprocedure Evaluation (Signed)
Anesthesia Post Note  Patient: Renee Cabrera  Procedure(s) Performed: CATARACT EXTRACTION PHACO AND INTRAOCULAR LENS PLACEMENT (IOC) RIGHT (Right Eye)  Patient location during evaluation: PACU Anesthesia Type: MAC Level of consciousness: awake Pain management: pain level controlled Vital Signs Assessment: post-procedure vital signs reviewed and stable Respiratory status: spontaneous breathing Cardiovascular status: blood pressure returned to baseline Postop Assessment: no apparent nausea or vomiting Anesthetic complications: no     Last Vitals:  Vitals:   01/11/19 0745  BP: (!) 161/76  Pulse: 76  Resp: 18  Temp: 36.5 C  SpO2: 99%    Last Pain:  Vitals:   01/11/19 0745  TempSrc: Tympanic  PainSc: 0-No pain                 Blima Singer

## 2019-01-11 NOTE — Anesthesia Preprocedure Evaluation (Signed)
Anesthesia Evaluation  Patient identified by MRN, date of birth, ID band Patient awake    Reviewed: Allergy & Precautions, H&P , NPO status , Patient's Chart, lab work & pertinent test results, reviewed documented beta blocker date and time   Airway Mallampati: II  TM Distance: >3 FB Neck ROM: full    Dental no notable dental hx. (+) Teeth Intact   Pulmonary neg pulmonary ROS, asthma ,    Pulmonary exam normal breath sounds clear to auscultation       Cardiovascular Exercise Tolerance: Poor hypertension, On Medications negative cardio ROS   Rhythm:regular Rate:Normal     Neuro/Psych  Headaches, PSYCHIATRIC DISORDERS Depression negative neurological ROS  negative psych ROS   GI/Hepatic negative GI ROS, Neg liver ROS,   Endo/Other  negative endocrine ROSdiabetes  Renal/GU      Musculoskeletal   Abdominal   Peds  Hematology negative hematology ROS (+) Blood dyscrasia, anemia ,   Anesthesia Other Findings   Reproductive/Obstetrics negative OB ROS                             Anesthesia Physical Anesthesia Plan  ASA: III  Anesthesia Plan: MAC   Post-op Pain Management:    Induction:   PONV Risk Score and Plan:   Airway Management Planned:   Additional Equipment:   Intra-op Plan:   Post-operative Plan:   Informed Consent: I have reviewed the patients History and Physical, chart, labs and discussed the procedure including the risks, benefits and alternatives for the proposed anesthesia with the patient or authorized representative who has indicated his/her understanding and acceptance.       Plan Discussed with: CRNA  Anesthesia Plan Comments:         Anesthesia Quick Evaluation

## 2019-01-11 NOTE — Transfer of Care (Signed)
Immediate Anesthesia Transfer of Care Note  Patient: Renee Cabrera  Procedure(s) Performed: CATARACT EXTRACTION PHACO AND INTRAOCULAR LENS PLACEMENT (IOC) RIGHT (Right Eye)  Patient Location: PACU  Anesthesia Type:MAC  Level of Consciousness: awake, alert  and oriented  Airway & Oxygen Therapy: Patient Spontanous Breathing  Post-op Assessment: Report given to RN and Post -op Vital signs reviewed and stable  Post vital signs: Reviewed and stable  Last Vitals:  Vitals Value Taken Time  BP    Temp    Pulse    Resp    SpO2      Last Pain:  Vitals:   01/11/19 0745  TempSrc: Tympanic  PainSc: 0-No pain         Complications: No apparent anesthesia complications

## 2019-10-05 ENCOUNTER — Telehealth: Payer: Self-pay | Admitting: *Deleted

## 2019-10-05 NOTE — Telephone Encounter (Signed)
C-Please have pt see me this week;MD; labs- cbc/cmp/ldh. Thx GB

## 2019-10-05 NOTE — Telephone Encounter (Signed)
Renee Cabrera Dr Verneda Skill nurse called requesting that patient be seen by Dr Rogue Bussing this week if at all possible. Patient had her physical yesterday and she is losing weight, she is down to 77 pounds. She is also coughing up some blood. Please advise

## 2019-10-05 NOTE — Telephone Encounter (Signed)
Returned call to Amy and advised of appointment for THursday 10/06/19 @ 915 lab, 9:45 Dr Rogue Bussing. She will also call and leave a message for patient regarding this appt

## 2019-10-06 ENCOUNTER — Encounter (INDEPENDENT_AMBULATORY_CARE_PROVIDER_SITE_OTHER): Payer: Self-pay

## 2019-10-06 ENCOUNTER — Inpatient Hospital Stay: Payer: Medicare Other | Attending: Internal Medicine

## 2019-10-06 ENCOUNTER — Other Ambulatory Visit: Payer: Self-pay

## 2019-10-06 ENCOUNTER — Telehealth: Payer: Self-pay | Admitting: Internal Medicine

## 2019-10-06 ENCOUNTER — Inpatient Hospital Stay: Payer: Medicare Other | Admitting: Internal Medicine

## 2019-10-06 ENCOUNTER — Ambulatory Visit
Admission: RE | Admit: 2019-10-06 | Discharge: 2019-10-06 | Disposition: A | Discharge status: Home / Self Care | Payer: Medicare Other | Source: Ambulatory Visit | Attending: Internal Medicine | Admitting: Internal Medicine

## 2019-10-06 VITALS — BP 149/92 | HR 111 | Temp 99.7°F | Wt 77.8 lb

## 2019-10-06 DIAGNOSIS — Z8711 Personal history of peptic ulcer disease: Secondary | ICD-10-CM | POA: Diagnosis not present

## 2019-10-06 DIAGNOSIS — L930 Discoid lupus erythematosus: Secondary | ICD-10-CM | POA: Diagnosis not present

## 2019-10-06 DIAGNOSIS — M810 Age-related osteoporosis without current pathological fracture: Secondary | ICD-10-CM | POA: Insufficient documentation

## 2019-10-06 DIAGNOSIS — Z7982 Long term (current) use of aspirin: Secondary | ICD-10-CM | POA: Diagnosis not present

## 2019-10-06 DIAGNOSIS — Z791 Long term (current) use of non-steroidal anti-inflammatories (NSAID): Secondary | ICD-10-CM | POA: Insufficient documentation

## 2019-10-06 DIAGNOSIS — I10 Essential (primary) hypertension: Secondary | ICD-10-CM | POA: Diagnosis not present

## 2019-10-06 DIAGNOSIS — R5383 Other fatigue: Secondary | ICD-10-CM | POA: Insufficient documentation

## 2019-10-06 DIAGNOSIS — Z903 Acquired absence of stomach [part of]: Secondary | ICD-10-CM | POA: Insufficient documentation

## 2019-10-06 DIAGNOSIS — R042 Hemoptysis: Secondary | ICD-10-CM | POA: Insufficient documentation

## 2019-10-06 DIAGNOSIS — F329 Major depressive disorder, single episode, unspecified: Secondary | ICD-10-CM | POA: Diagnosis not present

## 2019-10-06 DIAGNOSIS — R634 Abnormal weight loss: Secondary | ICD-10-CM

## 2019-10-06 DIAGNOSIS — D508 Other iron deficiency anemias: Secondary | ICD-10-CM

## 2019-10-06 DIAGNOSIS — E538 Deficiency of other specified B group vitamins: Secondary | ICD-10-CM | POA: Diagnosis not present

## 2019-10-06 DIAGNOSIS — Z79899 Other long term (current) drug therapy: Secondary | ICD-10-CM | POA: Diagnosis not present

## 2019-10-06 DIAGNOSIS — M1990 Unspecified osteoarthritis, unspecified site: Secondary | ICD-10-CM | POA: Insufficient documentation

## 2019-10-06 DIAGNOSIS — R5381 Other malaise: Secondary | ICD-10-CM | POA: Diagnosis not present

## 2019-10-06 DIAGNOSIS — K9589 Other complications of other bariatric procedure: Secondary | ICD-10-CM

## 2019-10-06 DIAGNOSIS — D509 Iron deficiency anemia, unspecified: Secondary | ICD-10-CM

## 2019-10-06 DIAGNOSIS — Z792 Long term (current) use of antibiotics: Secondary | ICD-10-CM | POA: Insufficient documentation

## 2019-10-06 LAB — CBC WITH DIFFERENTIAL/PLATELET
Abs Immature Granulocytes: 0.38 10*3/uL — ABNORMAL HIGH (ref 0.00–0.07)
Basophils Absolute: 0.1 10*3/uL (ref 0.0–0.1)
Basophils Relative: 1 %
Eosinophils Absolute: 0.1 10*3/uL (ref 0.0–0.5)
Eosinophils Relative: 1 %
HCT: 33 % — ABNORMAL LOW (ref 36.0–46.0)
Hemoglobin: 9.8 g/dL — ABNORMAL LOW (ref 12.0–15.0)
Immature Granulocytes: 2 %
Lymphocytes Relative: 8 %
Lymphs Abs: 1.4 10*3/uL (ref 0.7–4.0)
MCH: 28.2 pg (ref 26.0–34.0)
MCHC: 29.7 g/dL — ABNORMAL LOW (ref 30.0–36.0)
MCV: 95.1 fL (ref 80.0–100.0)
Monocytes Absolute: 1.5 10*3/uL — ABNORMAL HIGH (ref 0.1–1.0)
Monocytes Relative: 8 %
Neutro Abs: 15.1 10*3/uL — ABNORMAL HIGH (ref 1.7–7.7)
Neutrophils Relative %: 80 %
Platelets: 738 10*3/uL — ABNORMAL HIGH (ref 150–400)
RBC: 3.47 MIL/uL — ABNORMAL LOW (ref 3.87–5.11)
RDW: 14.6 % (ref 11.5–15.5)
WBC: 18.6 10*3/uL — ABNORMAL HIGH (ref 4.0–10.5)
nRBC: 0 % (ref 0.0–0.2)

## 2019-10-06 LAB — COMPREHENSIVE METABOLIC PANEL
ALT: 28 U/L (ref 0–44)
AST: 25 U/L (ref 15–41)
Albumin: 2.9 g/dL — ABNORMAL LOW (ref 3.5–5.0)
Alkaline Phosphatase: 75 U/L (ref 38–126)
Anion gap: 10 (ref 5–15)
BUN: 13 mg/dL (ref 8–23)
CO2: 20 mmol/L — ABNORMAL LOW (ref 22–32)
Calcium: 8.4 mg/dL — ABNORMAL LOW (ref 8.9–10.3)
Chloride: 112 mmol/L — ABNORMAL HIGH (ref 98–111)
Creatinine, Ser: 0.84 mg/dL (ref 0.44–1.00)
GFR calc Af Amer: >60 mL/min (ref >60)
GFR calc non Af Amer: >60 mL/min (ref >60)
Glucose, Bld: 104 mg/dL — ABNORMAL HIGH (ref 70–99)
Potassium: 4.3 mmol/L (ref 3.5–5.1)
Sodium: 142 mmol/L (ref 135–145)
Total Bilirubin: 0.3 mg/dL (ref 0.3–1.2)
Total Protein: 7.6 g/dL (ref 6.5–8.1)

## 2019-10-06 LAB — LACTATE DEHYDROGENASE: LDH: 106 U/L (ref 98–192)

## 2019-10-06 NOTE — Telephone Encounter (Signed)
LVM for pt to call us back to discuss the results of the CXR; awaiting CT C/A/P next week; follow up as planned. GB

## 2019-10-06 NOTE — Patient Instructions (Signed)
CXR today- will call with results

## 2019-10-06 NOTE — Progress Notes (Signed)
[name] OFFICE PROGRESS NOTE  Patient Care Team: [name], MD as PCP - General (Family Medicine)  Cancer Staging No matching staging information was found for the patient.   Oncology History   No history exists.  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 Patient also intolerant of oral iron  -   received parenteral iron therapy with Feraheme 510 mg x 2 doses in May 2014.   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  female pleasant patient above history of Iron deficiency anemia status post partial gastrectomy in 1960s has been referred to [place] for further evaluation recommendation for weight loss.  Patient was recently evaluated by PCP-noted to have elevated white count/platelets-1 with weight loss recommended follow-up with [place] for further work-up.  Patient noted to have progressive weight loss over the last 4 to 6 months.  Lost about 15 pounds.  Complains of poor appetite.  Complains of fatigue.  Notes to have cough shortness of breath and also blood mixed with sputum on coughing.  Patient denies any blood in stools black or stools.  No history of smoking.  Review of Systems  Constitutional: Positive for malaise/fatigue and weight loss. Negative for chills, diaphoresis and fever.  HENT: Negative for nosebleeds and sore throat.   Eyes: Negative for double vision.  Respiratory: Positive for cough, hemoptysis, sputum production and shortness of breath. Negative for wheezing.   Cardiovascular: Negative for chest pain, palpitations, orthopnea and leg swelling.  Gastrointestinal: Negative for abdominal pain, blood in stool, constipation, diarrhea, heartburn, melena, nausea and vomiting.       Sometimes- dysphagia  Genitourinary: Negative for dysuria, frequency and urgency.  Musculoskeletal: Negative for back pain and joint pain.   Skin: Negative.  Negative for itching and rash.  Neurological: Negative for dizziness, tingling, focal weakness, weakness and headaches.  Endo/Heme/Allergies: Does not bruise/bleed easily.  Psychiatric/Behavioral: Negative for depression. The patient is not nervous/anxious and does not have insomnia.    PAST MEDICAL HISTORY :  Past Medical History:  Diagnosis Date  . Anemia    IRON INFUSIONS  . Arthritis    osteoarthritis/ RA  . Asthma    remote h/o asthma  . Depression    controlled  . H/O hypoglycemia   . H/O: GI bleed   . Headache(784.0)    h/o migraines  . Hearing loss, bilateral    wears hearing aides  . History of blood transfusion    pt states she had yellow jaundice  . Hypertension    H/O NO TX NOW  . Lupus (systemic lupus erythematosus) ([place]) 1995   in remission since 2005  . Nasal sinus congestion   . Osteoporosis with fracture   . Stress fracture of foot    left foot  . Vitamin B 12 deficiency     PAST SURGICAL HISTORY :   Past Surgical History:  Procedure Laterality Date  . ANTERIOR CERVICAL DECOMP/DISCECTOMY FUSION  2009  . BACK SURGERY    . CATARACT EXTRACTION W/PHACO Left 09/30/2016   Procedure: CATARACT EXTRACTION PHACO AND INTRAOCULAR LENS PLACEMENT (IOC);  Surgeon: Birder Robson, MD;  Location: [facility] ORS;  Service: Ophthalmology;  Laterality: Left;  Lot# [id number] [place]: 00:43.6 AP%: 19.6 CDE: 8.55  . CATARACT EXTRACTION W/PHACO Right 01/11/2019   Procedure: CATARACT EXTRACTION PHACO AND INTRAOCULAR LENS PLACEMENT ([place]) RIGHT;  Surgeon: Birder Robson, MD;  Location: [facility]  ORS;  Service: Ophthalmology;  Laterality: Right;  [place] 01:03.9 CDE 9.19 Fluid pack Lot # [id number]  . INTRAMEDULLARY (IM) NAIL INTERTROCHANTERIC Right 07/01/2016   Procedure: INTRAMEDULLARY (IM) NAIL EXCHANGE OF FEMORAL ROD;  Surgeon: Hessie Knows, MD;  Location: [facility] ORS;  Service: Orthopedics;  Laterality: Right;  . ORIF FEMUR FRACTURE  2012  . PARTIAL GASTRECTOMY  1963   and  reconstruction 1973  . TONSILLECTOMY  1945    FAMILY HISTORY :   Family History  Problem Relation Age of Onset  . Breast cancer Neg Hx     SOCIAL HISTORY:   Social History   Tobacco Use  . Smoking status: Never Smoker  . Smokeless tobacco: Never Used  Substance Use Topics  . Alcohol use: No  . Drug use: No    ALLERGIES:  is allergic to sulfa antibiotics.  MEDICATIONS:  Current Outpatient Medications  Medication Sig Dispense Refill  . aspirin EC 81 MG tablet Take 81 mg by mouth daily.    . BELSOMRA 10 MG TABS Take 10 mg by mouth at bedtime.   5  . Calcium Carb-Cholecalciferol (CALTRATE 600+D3 PO) Take 1 tablet by mouth 2 (two) times daily.    . cholecalciferol (VITAMIN D) 1000 UNITS tablet Take 1,000 Units by mouth every evening.     . citalopram (CELEXA) 20 MG tablet Take 20 mg by mouth daily at 8 pm. (1900)    . cyanocobalamin (,VITAMIN B-12,) 1000 MCG/ML injection Inject 1,000 mcg into the muscle every 30 (thirty) days.     Marland Kitchen denosumab (PROLIA) 60 MG/ML SOSY injection Inject 60 mg into the skin every 6 (six) months.     . Fe Fum-FA-B Cmp-C-Zn-Mg-Mn-Cu (HEMATINIC PLUS VIT/MINERALS) 106-1 MG TABS Take 1 tablet by mouth daily with lunch.   3  . gabapentin (NEURONTIN) 100 MG capsule Take 100-200 mg by mouth See admin instructions. Take 1 capsule (100 mg) by mouth in the morning & take 2 capsules (200 mg) by mouth at night.    Marland Kitchen HYDROcodone-acetaminophen (NORCO/VICODIN) 5-325 MG tablet Take 1 tablet by mouth every 4 (four) hours as needed (pain.).    Marland Kitchen megestrol (MEGACE) 40 MG/ML suspension Take 800 mg by mouth daily.    . meloxicam (MOBIC) 7.5 MG tablet Take 7.5 mg by mouth daily with supper.     . mirtazapine (REMERON) 30 MG tablet Take 30 mg by mouth at bedtime.    . Multiple Vitamins-Minerals (PRESERVISION AREDS) TABS Take 1 tablet by mouth 2 (two) times daily.     . pantoprazole (PROTONIX) 40 MG tablet Take 40 mg by mouth daily before supper.   1  . travoprost,  benzalkonium, (TRAVATAN) 0.004 % ophthalmic solution Place 1 drop into both eyes at bedtime.    . vitamin C (ASCORBIC ACID) 500 MG tablet Take 500 mg by mouth 2 (two) times daily.     No current facility-administered medications for this visit.    PHYSICAL EXAMINATION: ECOG PERFORMANCE STATUS: 0 - Asymptomatic  BP (!) 149/92 (BP Location: Left Arm, Patient Position: Sitting)   Pulse (!) 111   Temp 99.7 F (37.6 C) (Tympanic)   Wt 77 lb 12.8 oz (35.3 kg)   BMI 15.19 kg/m   Filed Weights   10/06/19 0934  Weight: 77 lb 12.8 oz (35.3 kg)    Physical Exam  Constitutional: She is oriented to person, place, and time.  Thin built Caucasian female patient.  HENT:  Head: Normocephalic and atraumatic.  Mouth/Throat: Oropharynx is clear and moist.  No oropharyngeal exudate.  Eyes: Pupils are equal, round, and reactive to light.  Cardiovascular: Normal rate and regular rhythm.  Pulmonary/Chest: Effort normal and breath sounds normal. No respiratory distress. She has no wheezes.  Abdominal: Soft. Bowel sounds are normal. She exhibits no distension and no mass. There is no abdominal tenderness. There is no rebound and no guarding.  Musculoskeletal:        General: No tenderness or edema. Normal range of motion.     Cervical back: Normal range of motion and neck supple.  Neurological: She is alert and oriented to person, place, and time.  Skin: Skin is warm.  Psychiatric: Affect normal.     LABORATORY DATA:  I have reviewed the data as listed    Component Value Date/Time   NA 142 10/06/2019 0902   NA 142 02/09/2013 0515   K 4.3 10/06/2019 0902   K 4.0 02/09/2013 0515   CL 112 (H) 10/06/2019 0902   CL 113 (H) 02/09/2013 0515   CO2 20 (L) 10/06/2019 0902   CO2 25 02/09/2013 0515   GLUCOSE 104 (H) 10/06/2019 0902   GLUCOSE 80 02/09/2013 0515   BUN 13 10/06/2019 0902   BUN 20 (H) 02/09/2013 0515   CREATININE 0.84 10/06/2019 0902   CREATININE 0.58 (L) 02/09/2013 0515   CALCIUM  8.4 (L) 10/06/2019 0902   CALCIUM 7.5 (L) 02/09/2013 0515   PROT 7.6 10/06/2019 0902   PROT 6.8 02/08/2013 1558   ALBUMIN 2.9 (L) 10/06/2019 0902   ALBUMIN 3.8 02/08/2013 1558   AST 25 10/06/2019 0902   AST 16 02/08/2013 1558   ALT 28 10/06/2019 0902   ALT 12 02/08/2013 1558   ALKPHOS 75 10/06/2019 0902   ALKPHOS 53 02/08/2013 1558   BILITOT 0.3 10/06/2019 0902   BILITOT 0.2 02/08/2013 1558   GFRNONAA >60 10/06/2019 0902   GFRNONAA >60 02/09/2013 0515   GFRAA >60 10/06/2019 0902   GFRAA >60 02/09/2013 0515    No results found for: SPEP, UPEP  Lab Results  Component Value Date   WBC 18.6 (H) 10/06/2019   NEUTROABS 15.1 (H) 10/06/2019   HGB 9.8 (L) 10/06/2019   HCT 33.0 (L) 10/06/2019   MCV 95.1 10/06/2019   PLT 738 (H) 10/06/2019      Chemistry      Component Value Date/Time   NA 142 10/06/2019 0902   NA 142 02/09/2013 0515   K 4.3 10/06/2019 0902   K 4.0 02/09/2013 0515   CL 112 (H) 10/06/2019 0902   CL 113 (H) 02/09/2013 0515   CO2 20 (L) 10/06/2019 0902   CO2 25 02/09/2013 0515   BUN 13 10/06/2019 0902   BUN 20 (H) 02/09/2013 0515   CREATININE 0.84 10/06/2019 0902   CREATININE 0.58 (L) 02/09/2013 0515      Component Value Date/Time   CALCIUM 8.4 (L) 10/06/2019 0902   CALCIUM 7.5 (L) 02/09/2013 0515   ALKPHOS 75 10/06/2019 0902   ALKPHOS 53 02/08/2013 1558   AST 25 10/06/2019 0902   AST 16 02/08/2013 1558   ALT 28 10/06/2019 0902   ALT 12 02/08/2013 1558   BILITOT 0.3 10/06/2019 0902   BILITOT 0.2 02/08/2013 1558       RADIOGRAPHIC STUDIES: I have personally reviewed the radiological images as listed and agreed with the findings in the report. DG Chest 2 View  Result Date: 10/06/2019 CLINICAL DATA:  Hemoptysis EXAM: CHEST - 2 VIEW COMPARISON:  11/24/2012 FINDINGS: The heart size and mediastinal contours are  within normal limits. Calcified right hilar lymph nodes. There is a masslike opacity of the peripheral left lung, with an appearance suspicious  for internal cavitation. Multiple nonacute fractures of the posterior right ribs. IMPRESSION: There is a masslike opacity of the peripheral left lung, with an appearance suspicious for internal cavitation. Findings are most concerning for malignancy. Cavitary infection is a differential consideration. Recommend CT to further evaluate. Electronically Signed   By: Eddie Candle M.D.   On: 10/06/2019 11:43     ASSESSMENT & PLAN:  Iron deficiency anemia following bariatric surgery    Abnormal weight loss #Abnormal weight loss-more than 15 pounds in the last 4 to 6 months; unintentional /worsening shortness of breath cough with hemoptysis -highly concerning.  Recommend chest x-ray today.  Recommend CT scan chest and pelvis for further evaluation./Patient non-smoker.  #Elevated platelets/white count-likely reactive; await above work-up  #Iron deficient anemia-s/p partial gastrectomy.  Today hemoglobin is 9.5; again likely reactive today.  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  Left a message for the patient to discuss the results.   Orders Placed This Encounter  Procedures  . CT Chest W Contrast    Standing Status:   Future    Standing Expiration Date:   10/05/2020    Order Specific Question:   If indicated for the ordered procedure, I authorize the administration of contrast media per Radiology protocol    Answer:   Yes    Order Specific Question:   Preferred imaging location?    Answer:   [facility]    Order Specific Question:   Radiology Contrast Protocol - do NOT remove file path    Answer:   \\[email or web address]    Order Specific  Question:   ** REASON FOR EXAM (FREE TEXT)    Answer:   weight loss- abnormal  . CT ABDOMEN PELVIS W CONTRAST    Standing Status:   Future    Standing Expiration Date:   10/05/2020    Order Specific Question:   If indicated for the ordered procedure, I authorize the administration of contrast media per Radiology protocol    Answer:   Yes    Order Specific Question:   Preferred [facility] location?    [facility]    Order Specific Question:   Radiology Contrast Protocol - do NOT remove file path    Answer:   \\[email or web address]    Order Specific Question:   ** REASON FOR EXAM (FREE TEXT)    Answer:   weight loss abnormal  . DG Chest 2 View    Standing Status:   Future    Number of Occurrences:   1    Standing Expiration Date:   12/05/2020    Order Specific Question:   Reason for Exam (SYMPTOM  OR DIAGNOSIS REQUIRED)    Answer:   hemoptysis/ cough    Order Specific Question:   Preferred imaging location?    Answer:   [facility]   All questions were answered. The patient knows to call the clinic with any problems, questions or concerns.      Cammie Sickle, MD 10/07/2019 7:18 AM

## 2019-10-07 DIAGNOSIS — R634 Abnormal weight loss: Secondary | ICD-10-CM | POA: Insufficient documentation

## 2019-10-07 NOTE — Assessment & Plan Note (Addendum)
#  Abnormal weight loss-more than 15 pounds in the last 4 to 6 months; unintentional /worsening shortness of breath cough with hemoptysis -highly concerning.  Recommend chest x-ray today.  Recommend CT scan chest and pelvis for further evaluation./Patient non-smoker.  #Elevated platelets/white count-likely reactive; await above work-up  #Iron deficient anemia-s/p partial gastrectomy.  Today hemoglobin is 9.5; again likely reactive today.  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  Left a message for the patient to discuss the results.

## 2019-10-10 ENCOUNTER — Telehealth: Payer: Self-pay | Admitting: Internal Medicine

## 2019-10-10 NOTE — Telephone Encounter (Signed)
On 12/11-left a message for the patient to call us back to discuss results of the chest x-ray.

## 2019-10-12 ENCOUNTER — Ambulatory Visit
Admission: RE | Admit: 2019-10-12 | Discharge: 2019-10-12 | Disposition: A | Discharge status: Home / Self Care | Payer: Medicare Other | Source: Ambulatory Visit | Attending: Internal Medicine | Admitting: Internal Medicine

## 2019-10-12 ENCOUNTER — Telehealth: Payer: Self-pay | Admitting: Internal Medicine

## 2019-10-12 ENCOUNTER — Other Ambulatory Visit: Payer: Self-pay

## 2019-10-12 ENCOUNTER — Telehealth: Payer: Self-pay | Admitting: *Deleted

## 2019-10-12 DIAGNOSIS — N200 Calculus of kidney: Secondary | ICD-10-CM | POA: Diagnosis not present

## 2019-10-12 DIAGNOSIS — I700 Atherosclerosis of aorta: Secondary | ICD-10-CM | POA: Insufficient documentation

## 2019-10-12 DIAGNOSIS — M4316 Spondylolisthesis, lumbar region: Secondary | ICD-10-CM | POA: Insufficient documentation

## 2019-10-12 DIAGNOSIS — R634 Abnormal weight loss: Secondary | ICD-10-CM | POA: Diagnosis not present

## 2019-10-12 DIAGNOSIS — M329 Systemic lupus erythematosus, unspecified: Secondary | ICD-10-CM | POA: Insufficient documentation

## 2019-10-12 DIAGNOSIS — J851 Abscess of lung with pneumonia: Secondary | ICD-10-CM

## 2019-10-12 DIAGNOSIS — M5136 Other intervertebral disc degeneration, lumbar region: Secondary | ICD-10-CM | POA: Diagnosis not present

## 2019-10-12 DIAGNOSIS — D509 Iron deficiency anemia, unspecified: Secondary | ICD-10-CM | POA: Diagnosis not present

## 2019-10-12 DIAGNOSIS — Z903 Acquired absence of stomach [part of]: Secondary | ICD-10-CM | POA: Diagnosis not present

## 2019-10-12 MED ORDER — IOHEXOL 300 MG/ML  SOLN
100.0000 mL | Freq: Once | INTRAMUSCULAR | Status: AC | PRN
  Administered 2019-10-12: 80 mL via INTRAVENOUS

## 2019-10-12 MED ORDER — AMOXICILLIN-POT CLAVULANATE 875-125 MG PO TABS: 1.0000 | ORAL_TABLET | Freq: Two times a day (BID) | ORAL | 0 refills | Status: DC

## 2019-10-12 NOTE — Telephone Encounter (Signed)
Called report  IMPRESSION: 1. Probable necrotic pneumonia and/or abscesses in the lingula and left lower lobe with an associated small, partially loculated left pleural effusion. Malignancy is not excluded. Follow-up CT chest with contrast in 3-4 weeks is recommended in further initial evaluation. These results will be called to the ordering clinician or representative by the Radiologist Assistant, and communication documented in the PACS or zVision Dashboard. 2. Bilateral renal stones. 3. Grade 2 anterolisthesis of L3 on L4 with advanced secondary degenerative disc disease. 4.  Aortic atherosclerosis (ICD10-170.0).   Electronically Signed   By: Lorin Picket M.D.   On: 10/12/2019 13:50

## 2019-10-12 NOTE — Addendum Note (Signed)
Addended by: Sabino Gasser on: 10/12/2019 04:19 PM   Modules accepted: Orders

## 2019-10-12 NOTE — Telephone Encounter (Signed)
Referral entered/faxed

## 2019-10-12 NOTE — Telephone Encounter (Signed)
Spoke to pt Renee Cabrera: results of the CT scan- concenring for left lung abscess. Recommend augumentin BID; and referral to [facility] for further recommendations.   Please make referral to [facility] re: lung abscess/URGENT  Keep appt as planned this week.

## 2019-10-13 ENCOUNTER — Other Ambulatory Visit: Payer: Self-pay

## 2019-10-13 ENCOUNTER — Encounter: Payer: Self-pay | Admitting: Internal Medicine

## 2019-10-14 ENCOUNTER — Inpatient Hospital Stay: Payer: Medicare Other

## 2019-10-14 ENCOUNTER — Other Ambulatory Visit: Payer: Self-pay

## 2019-10-14 ENCOUNTER — Inpatient Hospital Stay (HOSPITAL_BASED_OUTPATIENT_CLINIC_OR_DEPARTMENT_OTHER): Payer: Medicare Other | Admitting: Internal Medicine

## 2019-10-14 VITALS — BP 136/85 | HR 100 | Temp 97.1°F | Resp 18 | Wt 76.0 lb

## 2019-10-14 VITALS — BP 135/66 | HR 82 | Resp 18

## 2019-10-14 DIAGNOSIS — D509 Iron deficiency anemia, unspecified: Secondary | ICD-10-CM | POA: Diagnosis not present

## 2019-10-14 DIAGNOSIS — K9589 Other complications of other bariatric procedure: Secondary | ICD-10-CM

## 2019-10-14 DIAGNOSIS — R634 Abnormal weight loss: Secondary | ICD-10-CM

## 2019-10-14 DIAGNOSIS — D508 Other iron deficiency anemias: Secondary | ICD-10-CM

## 2019-10-14 MED ORDER — IRON SUCROSE 20 MG/ML IV SOLN
200.0000 mg | Freq: Once | INTRAVENOUS | Status: AC
  Administered 2019-10-14: 200 mg via INTRAVENOUS
  Filled 2019-10-14: qty 10

## 2019-10-14 MED ORDER — SODIUM CHLORIDE 0.9 % IV SOLN
Freq: Once | INTRAVENOUS | Status: AC
  Filled 2019-10-14: qty 250

## 2019-10-14 MED ORDER — AMOXICILLIN-POT CLAVULANATE 875-125 MG PO TABS: 1.0000 | ORAL_TABLET | Freq: Two times a day (BID) | ORAL | 0 refills | Status: DC

## 2019-10-14 NOTE — Progress Notes (Signed)
Pt tolerated Venofer infusion well with no signs of complications or reactions. VSS. RN educated pt on the importance of notifying the clinic if any signs of complications occur at home and if it is an emergency to call 911. Pt verbalized understanding and all questions answered at this time. Pt stable for discharge and VSS.   Renee Cabrera [employer]

## 2019-10-14 NOTE — Assessment & Plan Note (Addendum)
#  Left lower lobe necrotic pneumonia/abscess versus malignancy [clinically less likely]. likely cause of patient's abnormal weight loss.  Patient currently on Augmentin started on 12/17; given the concern for abscess-patient need approximately 4-week antibiotics [new prescription for 2 more weeks given].  Patient has appointment with Dr.A/pulmonary on 1/06.  Await his evaluation-repeat imaging/need for bronchoscopy etc.  I discussed with Dr.Aleskerov.  #Weight loss /elevated platelets/white count-likely reactive/above infection; monitor for now.  #Iron deficient anemia-s/p partial gastrectomy-most recent hemoglobin 9.5.  Proceed with IV iron today..  # B12 deficiency secondary to surgery- continue parental B12 with PCP-stable  # DISPOSITION: # cancel jan 13th appt.  # IV venofer today.  # follow up in 4 weeks-  MD-video visit; labs- 1-2 days prior- cbc/crp/irpn studies/ferritin-Dr.B  # I reviewed the blood work- with the patient in detail; also reviewed the imaging independently [as summarized above]; and with the patient in detail. '  Cc: Dr.Hedrick

## 2019-10-14 NOTE — Progress Notes (Signed)
[place] OFFICE PROGRESS NOTE  Patient Care Team: [name], MD as PCP - General (Family Medicine)  Cancer Staging No matching staging information was found for the patient.   Oncology History   No history exists.  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 Patient also intolerant of oral iron  -   received parenteral iron therapy with Feraheme 510 mg x 2 doses in May 2014.   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  female pleasant patient above history of Iron deficiency anemia status post partial gastrectomy in 1960s-is here for follow-up of her CT scan ordered for her abnormal weight loss.  Patient continues to complain of poor appetite.  No nausea no vomiting.  Continues weight.  Worsening fatigue.  Mild cough and shortness of breath.  Mild blood in sputum..   Review of Systems  Constitutional: Positive for malaise/fatigue and weight loss. Negative for chills, diaphoresis and fever.  HENT: Negative for nosebleeds and sore throat.   Eyes: Negative for double vision.  Respiratory: Positive for cough, hemoptysis, sputum production and shortness of breath. Negative for wheezing.   Cardiovascular: Negative for chest pain, palpitations, orthopnea and leg swelling.  Gastrointestinal: Negative for abdominal pain, blood in stool, constipation, diarrhea, heartburn, melena, nausea and vomiting.       Sometimes- dysphagia  Genitourinary: Negative for dysuria, frequency and urgency.  Musculoskeletal: Negative for back pain and joint pain.  Skin: Negative.  Negative for itching and rash.  Neurological: Negative for dizziness, tingling, focal weakness, weakness and headaches.  Endo/Heme/Allergies: Does not bruise/bleed easily.  Psychiatric/Behavioral: Negative for depression. The patient is not nervous/anxious and does not have  insomnia.    PAST MEDICAL HISTORY :  Past Medical History:  Diagnosis Date  . Anemia    IRON INFUSIONS  . Arthritis    osteoarthritis/ RA  . Asthma    remote h/o asthma  . Depression    controlled  . H/O hypoglycemia   . H/O: GI bleed   . Headache(784.0)    h/o migraines  . Hearing loss, bilateral    wears hearing aides  . History of blood transfusion    pt states she had yellow jaundice  . Hypertension    H/O NO TX NOW  . Lupus (systemic lupus erythematosus) ([place]) 1995   in remission since 2005  . Nasal sinus congestion   . Osteoporosis with fracture   . Stress fracture of foot    left foot  . Vitamin B 12 deficiency     PAST SURGICAL HISTORY :   Past Surgical History:  Procedure Laterality Date  . ANTERIOR CERVICAL DECOMP/DISCECTOMY FUSION  2009  . BACK SURGERY    . CATARACT EXTRACTION W/PHACO Left 09/30/2016   Procedure: CATARACT EXTRACTION PHACO AND INTRAOCULAR LENS PLACEMENT (IOC);  Surgeon: Birder Robson, MD;  Location: [facility] ORS;  Service: Ophthalmology;  Laterality: Left;  Lot# [id number] [place]: 00:43.6 AP%: 19.6 CDE: 8.55  . CATARACT EXTRACTION W/PHACO Right 01/11/2019   Procedure: CATARACT EXTRACTION PHACO AND INTRAOCULAR LENS PLACEMENT ([place]) RIGHT;  Surgeon: Birder Robson, MD;  Location: [facility] ORS;  Service: Ophthalmology;  Laterality: Right;  [place] 01:03.9 CDE 9.19 Fluid pack Lot # [id number]  . INTRAMEDULLARY (IM) NAIL INTERTROCHANTERIC Right 07/01/2016   Procedure: INTRAMEDULLARY (IM) NAIL EXCHANGE OF FEMORAL ROD;  Surgeon: Hessie Knows, MD;  Location: [facility] ORS;  Service: Orthopedics;  Laterality:  Right;  Marland Kitchen ORIF FEMUR FRACTURE  2012  . PARTIAL GASTRECTOMY  1963   and reconstruction 1973  . TONSILLECTOMY  1945    FAMILY HISTORY :   Family History  Problem Relation Age of Onset  . Breast cancer Neg Hx     SOCIAL HISTORY:   Social History   Tobacco Use  . Smoking status: Never Smoker  . Smokeless tobacco: Never Used  Substance Use Topics  . Alcohol  use: No  . Drug use: No    ALLERGIES:  is allergic to sulfa antibiotics.  MEDICATIONS:  Current Outpatient Medications  Medication Sig Dispense Refill  . amoxicillin-clavulanate (AUGMENTIN) 875-125 MG tablet Take 1 tablet by mouth 2 (two) times daily. 28 tablet 0  . aspirin EC 81 MG tablet Take 81 mg by mouth daily.    . BELSOMRA 10 MG TABS Take 10 mg by mouth at bedtime.   5  . Calcium Carb-Cholecalciferol (CALTRATE 600+D3 PO) Take 1 tablet by mouth 2 (two) times daily.    . cholecalciferol (VITAMIN D) 1000 UNITS tablet Take 1,000 Units by mouth every evening.     . citalopram (CELEXA) 20 MG tablet Take 20 mg by mouth daily at 8 pm. (1900)    . cyanocobalamin (,VITAMIN B-12,) 1000 MCG/ML injection Inject 1,000 mcg into the muscle every 30 (thirty) days.     Marland Kitchen denosumab (PROLIA) 60 MG/ML SOSY injection Inject 60 mg into the skin every 6 (six) months.     . Fe Fum-FA-B Cmp-C-Zn-Mg-Mn-Cu (HEMATINIC PLUS VIT/MINERALS) 106-1 MG TABS Take 1 tablet by mouth daily with lunch.   3  . gabapentin (NEURONTIN) 100 MG capsule Take 100-200 mg by mouth See admin instructions. Take 1 capsule (100 mg) by mouth in the morning & take 2 capsules (200 mg) by mouth at night.    Marland Kitchen HYDROcodone-acetaminophen (NORCO/VICODIN) 5-325 MG tablet Take 1 tablet by mouth every 4 (four) hours as needed (pain.).    Marland Kitchen megestrol (MEGACE) 40 MG/ML suspension Take 800 mg by mouth daily.    . meloxicam (MOBIC) 7.5 MG tablet Take 7.5 mg by mouth daily with supper.     . mirtazapine (REMERON) 30 MG tablet Take 30 mg by mouth at bedtime.    . Multiple Vitamins-Minerals (PRESERVISION AREDS) TABS Take 1 tablet by mouth 2 (two) times daily.     . pantoprazole (PROTONIX) 40 MG tablet Take 40 mg by mouth daily before supper.   1  . travoprost, benzalkonium, (TRAVATAN) 0.004 % ophthalmic solution Place 1 drop into both eyes at bedtime.    . vitamin C (ASCORBIC ACID) 500 MG tablet Take 500 mg by mouth 2 (two) times daily.     No current  facility-administered medications for this visit.    PHYSICAL EXAMINATION: ECOG PERFORMANCE STATUS: 0 - Asymptomatic  BP 136/85 (BP Location: Right Arm, Patient Position: Sitting)   Pulse 100   Temp (!) 97.1 F (36.2 C) (Tympanic)   Resp 18   Wt 76 lb (34.5 kg)   BMI 14.84 kg/m   Filed Weights   10/14/19 1058  Weight: 76 lb (34.5 kg)    Physical Exam  Constitutional: She is oriented to person, place, and time.  Thin built Caucasian female patient.  HENT:  Head: Normocephalic and atraumatic.  Mouth/Throat: Oropharynx is clear and moist. No oropharyngeal exudate.  Eyes: Pupils are equal, round, and reactive to light.  Cardiovascular: Normal rate and regular rhythm.  Pulmonary/Chest: Effort normal and breath sounds normal. No respiratory distress.  She has no wheezes.  Abdominal: Soft. Bowel sounds are normal. She exhibits no distension and no mass. There is no abdominal tenderness. There is no rebound and no guarding.  Musculoskeletal:        General: No tenderness or edema. Normal range of motion.     Cervical back: Normal range of motion and neck supple.  Neurological: She is alert and oriented to person, place, and time.  Skin: Skin is warm.  Psychiatric: Affect normal.     LABORATORY DATA:  I have reviewed the data as listed    Component Value Date/Time   NA 142 10/06/2019 0902   NA 142 02/09/2013 0515   K 4.3 10/06/2019 0902   K 4.0 02/09/2013 0515   CL 112 (H) 10/06/2019 0902   CL 113 (H) 02/09/2013 0515   CO2 20 (L) 10/06/2019 0902   CO2 25 02/09/2013 0515   GLUCOSE 104 (H) 10/06/2019 0902   GLUCOSE 80 02/09/2013 0515   BUN 13 10/06/2019 0902   BUN 20 (H) 02/09/2013 0515   CREATININE 0.84 10/06/2019 0902   CREATININE 0.58 (L) 02/09/2013 0515   CALCIUM 8.4 (L) 10/06/2019 0902   CALCIUM 7.5 (L) 02/09/2013 0515   PROT 7.6 10/06/2019 0902   PROT 6.8 02/08/2013 1558   ALBUMIN 2.9 (L) 10/06/2019 0902   ALBUMIN 3.8 02/08/2013 1558   AST 25 10/06/2019 0902    AST 16 02/08/2013 1558   ALT 28 10/06/2019 0902   ALT 12 02/08/2013 1558   ALKPHOS 75 10/06/2019 0902   ALKPHOS 53 02/08/2013 1558   BILITOT 0.3 10/06/2019 0902   BILITOT 0.2 02/08/2013 1558   GFRNONAA >60 10/06/2019 0902   GFRNONAA >60 02/09/2013 0515   GFRAA >60 10/06/2019 0902   GFRAA >60 02/09/2013 0515    No results found for: SPEP, UPEP  Lab Results  Component Value Date   WBC 18.6 (H) 10/06/2019   NEUTROABS 15.1 (H) 10/06/2019   HGB 9.8 (L) 10/06/2019   HCT 33.0 (L) 10/06/2019   MCV 95.1 10/06/2019   PLT 738 (H) 10/06/2019      Chemistry      Component Value Date/Time   NA 142 10/06/2019 0902   NA 142 02/09/2013 0515   K 4.3 10/06/2019 0902   K 4.0 02/09/2013 0515   CL 112 (H) 10/06/2019 0902   CL 113 (H) 02/09/2013 0515   CO2 20 (L) 10/06/2019 0902   CO2 25 02/09/2013 0515   BUN 13 10/06/2019 0902   BUN 20 (H) 02/09/2013 0515   CREATININE 0.84 10/06/2019 0902   CREATININE 0.58 (L) 02/09/2013 0515      Component Value Date/Time   CALCIUM 8.4 (L) 10/06/2019 0902   CALCIUM 7.5 (L) 02/09/2013 0515   ALKPHOS 75 10/06/2019 0902   ALKPHOS 53 02/08/2013 1558   AST 25 10/06/2019 0902   AST 16 02/08/2013 1558   ALT 28 10/06/2019 0902   ALT 12 02/08/2013 1558   BILITOT 0.3 10/06/2019 0902   BILITOT 0.2 02/08/2013 1558       RADIOGRAPHIC STUDIES: I have personally reviewed the radiological images as listed and agreed with the findings in the report. No results found.   ASSESSMENT & PLAN:  Abnormal weight loss #Left lower lobe necrotic pneumonia/abscess versus malignancy [clinically less likely]. likely cause of patient's abnormal weight loss.  Patient currently on Augmentin started on 12/17; given the concern for abscess-patient need approximately 4-week antibiotics [new prescription for 2 more weeks given].  Patient has appointment with Dr.A/pulmonary  on 1/06.  Await his evaluation-repeat imaging/need for bronchoscopy etc.  I discussed with  Dr.Aleskerov.  #Weight loss /elevated platelets/white count-likely reactive/above infection; monitor for now.  #Iron deficient anemia-s/p partial gastrectomy-most recent hemoglobin 9.5.  Proceed with IV iron today..  # B12 deficiency secondary to surgery- continue parental B12 with PCP-stable  # DISPOSITION: # cancel jan 13th appt.  # IV venofer today.  # follow up in 4 weeks-  MD-video visit; labs- 1-2 days prior- cbc/crp/irpn studies/ferritin-Dr.B  # I reviewed the blood work- with the patient in detail; also reviewed the imaging independently [as summarized above]; and with the patient in detail. '  Cc: Dr.Hedrick   Orders Placed This Encounter  Procedures  . CBC with Differential    Standing Status:   Future    Standing Expiration Date:   10/13/2020  . Ferritin    Standing Status:   Future    Standing Expiration Date:   10/13/2020  . Iron and TIBC    Standing Status:   Future    Standing Expiration Date:   10/13/2020  . C-reactive protein    Standing Status:   Future    Standing Expiration Date:   10/13/2020   All questions were answered. The patient knows to call the clinic with any problems, questions or concerns.      Cammie Sickle, MD 10/14/2019 12:18 PM

## 2019-10-31 ENCOUNTER — Other Ambulatory Visit: Payer: Self-pay | Admitting: Family Medicine

## 2019-10-31 DIAGNOSIS — Z1231 Encounter for screening mammogram for malignant neoplasm of breast: Secondary | ICD-10-CM

## 2019-11-07 ENCOUNTER — Other Ambulatory Visit: Payer: Medicare Other

## 2019-11-09 ENCOUNTER — Ambulatory Visit: Payer: Medicare Other | Admitting: Internal Medicine

## 2019-11-10 ENCOUNTER — Other Ambulatory Visit: Payer: Self-pay | Admitting: Pulmonary Disease

## 2019-11-10 DIAGNOSIS — R911 Solitary pulmonary nodule: Secondary | ICD-10-CM

## 2019-11-10 DIAGNOSIS — R918 Other nonspecific abnormal finding of lung field: Secondary | ICD-10-CM

## 2019-11-11 ENCOUNTER — Inpatient Hospital Stay: Payer: Medicare PPO | Attending: Internal Medicine

## 2019-11-11 ENCOUNTER — Other Ambulatory Visit: Payer: Self-pay

## 2019-11-11 ENCOUNTER — Inpatient Hospital Stay (HOSPITAL_BASED_OUTPATIENT_CLINIC_OR_DEPARTMENT_OTHER): Payer: Medicare PPO | Admitting: Internal Medicine

## 2019-11-11 DIAGNOSIS — D508 Other iron deficiency anemias: Secondary | ICD-10-CM

## 2019-11-11 DIAGNOSIS — R634 Abnormal weight loss: Secondary | ICD-10-CM | POA: Diagnosis not present

## 2019-11-11 DIAGNOSIS — Z9884 Bariatric surgery status: Secondary | ICD-10-CM

## 2019-11-11 DIAGNOSIS — K9589 Other complications of other bariatric procedure: Secondary | ICD-10-CM

## 2019-11-11 DIAGNOSIS — D509 Iron deficiency anemia, unspecified: Secondary | ICD-10-CM

## 2019-11-11 DIAGNOSIS — E538 Deficiency of other specified B group vitamins: Secondary | ICD-10-CM | POA: Insufficient documentation

## 2019-11-11 DIAGNOSIS — R918 Other nonspecific abnormal finding of lung field: Secondary | ICD-10-CM | POA: Diagnosis present

## 2019-11-11 LAB — CBC WITH DIFFERENTIAL/PLATELET
Abs Immature Granulocytes: 0.04 10*3/uL (ref 0.00–0.07)
Basophils Absolute: 0.1 10*3/uL (ref 0.0–0.1)
Basophils Relative: 1 %
Eosinophils Absolute: 0.3 10*3/uL (ref 0.0–0.5)
Eosinophils Relative: 3 %
HCT: 45.2 % (ref 36.0–46.0)
Hemoglobin: 13.8 g/dL (ref 12.0–15.0)
Immature Granulocytes: 0 %
Lymphocytes Relative: 29 %
Lymphs Abs: 2.7 10*3/uL (ref 0.7–4.0)
MCH: 29.7 pg (ref 26.0–34.0)
MCHC: 30.5 g/dL (ref 30.0–36.0)
MCV: 97.4 fL (ref 80.0–100.0)
Monocytes Absolute: 0.8 10*3/uL (ref 0.1–1.0)
Monocytes Relative: 8 %
Neutro Abs: 5.4 10*3/uL (ref 1.7–7.7)
Neutrophils Relative %: 59 %
Platelets: 370 10*3/uL (ref 150–400)
RBC: 4.64 MIL/uL (ref 3.87–5.11)
RDW: 17.8 % — ABNORMAL HIGH (ref 11.5–15.5)
WBC: 9.2 10*3/uL (ref 4.0–10.5)
nRBC: 0 % (ref 0.0–0.2)

## 2019-11-11 LAB — FERRITIN: Ferritin: 207 ng/mL (ref 11–307)

## 2019-11-11 LAB — IRON AND TIBC
Iron: 95 ug/dL (ref 28–170)
Saturation Ratios: 27 % (ref 10.4–31.8)
TIBC: 350 ug/dL (ref 250–450)
UIBC: 255 ug/dL

## 2019-11-11 LAB — C-REACTIVE PROTEIN: CRP: 1 mg/dL — ABNORMAL HIGH (ref <1.0)

## 2019-11-11 NOTE — Assessment & Plan Note (Addendum)
#   Iron deficient anemia-s/p partial gastrectomy-hemoglobin stable-13.8 saturation 27%; hold off IV iron.  # Left lower lobe necrotic pneumonia/abscess versus malignancy [clinically less likely].  S/p Augmentin White count improved.  CRP 1.  Reviewed evaluation by Dr.A- pulmonary.  Awaiting repeat CT scan.  Discussed the possible need of bronchoscopy if CT shows persistence disease.  # B12 deficiency secondary to surgery- continue parental B12 with PCP--stable.  # # I discussed regarding Covid-19 precautions.  I reviewed the vaccine effectiveness and potential side effects in detail.  Also discussed long-term effectiveness and safety profile are unclear at this time.  I discussed December, 2020 ASCO position statement-that all patients are recommended COVID-19 vaccinations [when available]-as long as they do not have allergy to components of the vaccine.  However, I think the benefits of the vaccination outweigh the potential risks. Re: [id number] vaccination.  For more information/scheduling recommend call [phone], 8:30am-4:30pm.   # DISPOSITION: # follow up in 6 month- MD-possible Venofer-; labs- 1-2 days prior- cbc/crp/iron studies/ferritin/ Dr.B  Cc: Dr.Hedrick

## 2019-11-14 NOTE — Progress Notes (Signed)
I connected with Renee Cabrera on 01/15/2021at  3:00 PM EST by video enabled telemedicine visit and verified that I am speaking with the correct person using two identifiers.  I discussed the limitations, risks, security and privacy concerns of performing an evaluation and management service by telemedicine and the availability of in-person appointments. I also discussed with the patient that there may be a patient responsible charge related to this service. The patient expressed understanding and agreed to proceed.    Other persons participating in the visit and their role in the encounter: RN/medical reconciliation Patient's location: Home Provider's location: Office  Oncology History   No history exists.     Chief Complaint: Abnormal weight loss/lung mass   History of present illness:Renee Cabrera 79 y.o.  female with history of iron deficient anemia secondary to gastric surgery; also recent diagnosis of lung mass is here for follow-up.  Patient was finally evaluated by pulmonary this week.  She is awaiting repeat CT scan in the next few days.  Patient finished Augmentin without any major problems.  No diarrhea.  Patient states her cough is improved.  Her appetite is improved.  States her weight is stable.  No fever no chills.  No nausea no vomiting.  Observation/objective: Hemoglobin 13.8 saturation 9% percent.  Assessment and plan: Iron deficiency anemia following bariatric surgery # Iron deficient anemia-s/p partial gastrectomy-hemoglobin stable-13.8 saturation 27%; hold off IV iron.  # Left lower lobe necrotic pneumonia/abscess versus malignancy [clinically less likely].  S/p Augmentin White count improved.  CRP 1.  Reviewed evaluation by Dr.A- pulmonary.  Awaiting repeat CT scan.  Discussed the possible need of bronchoscopy if CT shows persistence disease.  # B12 deficiency secondary to surgery- continue parental B12 with PCP--stable.  # # I discussed regarding  Covid-19 precautions.  I reviewed the vaccine effectiveness and potential side effects in detail.  Also discussed long-term effectiveness and safety profile are unclear at this time.  I discussed December, 2020 ASCO position statement-that all patients are recommended COVID-19 vaccinations [when available]-as long as they do not have allergy to components of the vaccine.  However, I think the benefits of the vaccination outweigh the potential risks. Re: [id number] vaccination.  For more information/scheduling recommend call [phone], 8:30am-4:30pm.   # DISPOSITION: # follow up in 6 month- MD-possible Venofer-; labs- 1-2 days prior- cbc/crp/iron studies/ferritin/ Dr.B  Cc: Dr.Hedrick  Follow-up instructions:  I discussed the assessment and treatment plan with the patient.  The patient was provided an opportunity to ask questions and all were answered.  The patient agreed with the plan and demonstrated understanding of instructions.  The patient was advised to call back or seek an in person evaluation if the symptoms worsen or if the condition fails to improve as anticipated.  Dr. Charlaine Dalton [place] at [facility] 11/14/2019 7:55 AM

## 2019-11-16 ENCOUNTER — Other Ambulatory Visit: Payer: Self-pay

## 2019-11-16 ENCOUNTER — Ambulatory Visit
Admission: RE | Admit: 2019-11-16 | Discharge: 2019-11-16 | Disposition: A | Discharge status: Home / Self Care | Payer: Medicare PPO | Source: Ambulatory Visit | Attending: Pulmonary Disease | Admitting: Pulmonary Disease

## 2019-11-16 DIAGNOSIS — R918 Other nonspecific abnormal finding of lung field: Secondary | ICD-10-CM | POA: Diagnosis present

## 2019-11-16 DIAGNOSIS — R911 Solitary pulmonary nodule: Secondary | ICD-10-CM | POA: Diagnosis present

## 2019-11-16 MED ORDER — IOHEXOL 300 MG/ML  SOLN
60.0000 mL | Freq: Once | INTRAMUSCULAR | Status: AC | PRN
  Administered 2019-11-16: 60 mL via INTRAVENOUS

## 2019-11-24 ENCOUNTER — Other Ambulatory Visit: Payer: Self-pay | Admitting: Pulmonary Disease

## 2019-11-24 DIAGNOSIS — R918 Other nonspecific abnormal finding of lung field: Secondary | ICD-10-CM

## 2019-11-25 ENCOUNTER — Ambulatory Visit
Admission: RE | Admit: 2019-11-25 | Discharge: 2019-11-25 | Disposition: A | Discharge status: Home / Self Care | Payer: Medicare PPO | Source: Ambulatory Visit | Attending: Family Medicine | Admitting: Family Medicine

## 2019-11-25 DIAGNOSIS — Z1231 Encounter for screening mammogram for malignant neoplasm of breast: Secondary | ICD-10-CM | POA: Insufficient documentation

## 2019-12-09 ENCOUNTER — Ambulatory Visit: Payer: Medicare PPO | Attending: Internal Medicine

## 2019-12-09 ENCOUNTER — Other Ambulatory Visit: Payer: Self-pay

## 2019-12-09 DIAGNOSIS — Z23 Encounter for immunization: Secondary | ICD-10-CM

## 2019-12-09 NOTE — Progress Notes (Signed)
   Covid-19 Vaccination Clinic  Name:  RECHELLE SCHULER    MRN: [id number] DOB: Oct 09, 1941  12/09/2019  Ms. Belland was observed post Covid-19 immunization for 30 minutes based on pre-vaccination screening without incidence. She was provided with Vaccine Information Sheet and instruction to access the V-Safe system.   Ms. Folck was instructed to call 911 with any severe reactions post vaccine: Marland Kitchen Difficulty breathing  . Swelling of your face and throat  . A fast heartbeat  . A bad rash all over your body  . Dizziness and weakness    Immunizations Administered    Name Date Dose VIS Date Route   Pfizer COVID-19 Vaccine 12/09/2019 11:15 AM 0.3 mL 10/07/2019 Intramuscular   Manufacturer: [place]   Lot: [id number]   [place]: [id number]

## 2020-01-04 ENCOUNTER — Ambulatory Visit: Payer: Medicare PPO | Attending: Internal Medicine

## 2020-01-04 DIAGNOSIS — Z23 Encounter for immunization: Secondary | ICD-10-CM | POA: Insufficient documentation

## 2020-01-04 NOTE — Progress Notes (Signed)
   Covid-19 Vaccination Clinic  Name:  Renee Cabrera    MRN: [id number] DOB: [birth year]  01/04/2020  [name] was observed post Covid-19 immunization for 15 minutes without incident. She was provided with Vaccine Information Sheet and instruction to access the V-Safe system.   [name] was instructed to call 911 with any severe reactions post vaccine: Marland Kitchen Difficulty breathing  . Swelling of face and throat  . A fast heartbeat  . A bad rash all over body  . Dizziness and weakness   Immunizations Administered    Name Date Dose VIS Date Route   Pfizer COVID-19 Vaccine 01/04/2020 10:03 AM 0.3 mL 10/07/2019 Intramuscular   Manufacturer: [place]   Lot: [id number]   [place]: [id number]

## 2020-01-25 ENCOUNTER — Other Ambulatory Visit: Payer: Self-pay | Admitting: Dermatology

## 2020-02-02 ENCOUNTER — Ambulatory Visit: Admission: RE | Admit: 2020-02-02 | Payer: Medicare PPO | Source: Ambulatory Visit

## 2020-02-16 ENCOUNTER — Ambulatory Visit
Admission: RE | Admit: 2020-02-16 | Discharge: 2020-02-16 | Disposition: A | Discharge status: Home / Self Care | Payer: Medicare PPO | Source: Ambulatory Visit | Attending: Pulmonary Disease | Admitting: Pulmonary Disease

## 2020-02-16 ENCOUNTER — Other Ambulatory Visit: Payer: Self-pay

## 2020-02-16 DIAGNOSIS — R918 Other nonspecific abnormal finding of lung field: Secondary | ICD-10-CM | POA: Insufficient documentation

## 2020-02-16 LAB — POCT I-STAT CREATININE: Creatinine, Ser: 1.1 mg/dL — ABNORMAL HIGH (ref 0.44–1.00)

## 2020-02-16 MED ORDER — IOHEXOL 300 MG/ML  SOLN
75.0000 mL | Freq: Once | INTRAMUSCULAR | Status: AC | PRN
  Administered 2020-02-16: 75 mL via INTRAVENOUS

## 2020-02-24 ENCOUNTER — Other Ambulatory Visit: Payer: Self-pay | Admitting: Dermatology

## 2020-04-25 ENCOUNTER — Other Ambulatory Visit: Payer: Self-pay | Admitting: Dermatology

## 2020-05-09 ENCOUNTER — Other Ambulatory Visit: Payer: Self-pay

## 2020-05-09 ENCOUNTER — Inpatient Hospital Stay: Payer: Medicare PPO | Attending: Internal Medicine

## 2020-05-09 ENCOUNTER — Other Ambulatory Visit: Payer: Self-pay | Admitting: *Deleted

## 2020-05-09 DIAGNOSIS — E538 Deficiency of other specified B group vitamins: Secondary | ICD-10-CM | POA: Diagnosis not present

## 2020-05-09 DIAGNOSIS — K9589 Other complications of other bariatric procedure: Secondary | ICD-10-CM

## 2020-05-09 DIAGNOSIS — M1990 Unspecified osteoarthritis, unspecified site: Secondary | ICD-10-CM | POA: Diagnosis not present

## 2020-05-09 DIAGNOSIS — M329 Systemic lupus erythematosus, unspecified: Secondary | ICD-10-CM | POA: Insufficient documentation

## 2020-05-09 DIAGNOSIS — F329 Major depressive disorder, single episode, unspecified: Secondary | ICD-10-CM | POA: Diagnosis not present

## 2020-05-09 DIAGNOSIS — Z9049 Acquired absence of other specified parts of digestive tract: Secondary | ICD-10-CM | POA: Insufficient documentation

## 2020-05-09 DIAGNOSIS — R634 Abnormal weight loss: Secondary | ICD-10-CM | POA: Insufficient documentation

## 2020-05-09 DIAGNOSIS — Z8711 Personal history of peptic ulcer disease: Secondary | ICD-10-CM | POA: Diagnosis not present

## 2020-05-09 DIAGNOSIS — Z7982 Long term (current) use of aspirin: Secondary | ICD-10-CM | POA: Diagnosis not present

## 2020-05-09 DIAGNOSIS — R05 Cough: Secondary | ICD-10-CM | POA: Diagnosis not present

## 2020-05-09 DIAGNOSIS — D508 Other iron deficiency anemias: Secondary | ICD-10-CM | POA: Diagnosis not present

## 2020-05-09 DIAGNOSIS — R5381 Other malaise: Secondary | ICD-10-CM | POA: Insufficient documentation

## 2020-05-09 DIAGNOSIS — R5383 Other fatigue: Secondary | ICD-10-CM | POA: Insufficient documentation

## 2020-05-09 DIAGNOSIS — R918 Other nonspecific abnormal finding of lung field: Secondary | ICD-10-CM | POA: Insufficient documentation

## 2020-05-09 DIAGNOSIS — Z79899 Other long term (current) drug therapy: Secondary | ICD-10-CM | POA: Diagnosis not present

## 2020-05-09 LAB — CBC WITH DIFFERENTIAL/PLATELET
Abs Immature Granulocytes: 0.06 10*3/uL (ref 0.00–0.07)
Basophils Absolute: 0.1 10*3/uL (ref 0.0–0.1)
Basophils Relative: 1 %
Eosinophils Absolute: 0.1 10*3/uL (ref 0.0–0.5)
Eosinophils Relative: 1 %
HCT: 40.5 % (ref 36.0–46.0)
Hemoglobin: 13.8 g/dL (ref 12.0–15.0)
Immature Granulocytes: 1 %
Lymphocytes Relative: 22 %
Lymphs Abs: 2.3 10*3/uL (ref 0.7–4.0)
MCH: 33.2 pg (ref 26.0–34.0)
MCHC: 34.1 g/dL (ref 30.0–36.0)
MCV: 97.4 fL (ref 80.0–100.0)
Monocytes Absolute: 0.7 10*3/uL (ref 0.1–1.0)
Monocytes Relative: 7 %
Neutro Abs: 7.2 10*3/uL (ref 1.7–7.7)
Neutrophils Relative %: 68 %
Platelets: 326 10*3/uL (ref 150–400)
RBC: 4.16 MIL/uL (ref 3.87–5.11)
RDW: 12.8 % (ref 11.5–15.5)
WBC: 10.5 10*3/uL (ref 4.0–10.5)
nRBC: 0 % (ref 0.0–0.2)

## 2020-05-09 LAB — IRON AND TIBC
Iron: 111 ug/dL (ref 28–170)
Saturation Ratios: 37 % — ABNORMAL HIGH (ref 10.4–31.8)
TIBC: 300 ug/dL (ref 250–450)
UIBC: 189 ug/dL

## 2020-05-09 LAB — FERRITIN: Ferritin: 124 ng/mL (ref 11–307)

## 2020-05-09 LAB — C-REACTIVE PROTEIN: CRP: 0.7 mg/dL (ref <1.0)

## 2020-05-10 ENCOUNTER — Encounter: Payer: Self-pay | Admitting: Internal Medicine

## 2020-05-10 NOTE — Progress Notes (Signed)
Patient was called for pre assessment. She denies any pain at this time. She states her only concern is she is having weakness and fatigue for the past few months. She states she has also noticed that she has been bruising very easily. She denied other concerns at this time.

## 2020-05-11 ENCOUNTER — Inpatient Hospital Stay: Payer: Medicare PPO

## 2020-05-11 ENCOUNTER — Inpatient Hospital Stay: Payer: Medicare PPO | Admitting: Internal Medicine

## 2020-05-11 ENCOUNTER — Other Ambulatory Visit: Payer: Self-pay

## 2020-05-11 VITALS — HR 90 | Temp 97.7°F | Resp 18 | Ht 59.0 in | Wt 90.8 lb

## 2020-05-11 DIAGNOSIS — D508 Other iron deficiency anemias: Secondary | ICD-10-CM | POA: Diagnosis not present

## 2020-05-11 DIAGNOSIS — K9589 Other complications of other bariatric procedure: Secondary | ICD-10-CM

## 2020-05-11 DIAGNOSIS — D509 Iron deficiency anemia, unspecified: Secondary | ICD-10-CM

## 2020-05-11 MED ORDER — SODIUM CHLORIDE 0.9 % IV SOLN
Freq: Once | INTRAVENOUS | Status: DC
  Filled 2020-05-11: qty 250

## 2020-05-11 MED ORDER — IRON SUCROSE 20 MG/ML IV SOLN: 200.0000 mg | Freq: Once | INTRAVENOUS | Status: DC

## 2020-05-11 NOTE — Assessment & Plan Note (Addendum)
#   Iron deficient anemia-s/p partial gastrectomy-hemoglobin stable-13.8 saturation 27%; hold off IV iron;.  # Left lower lobe necrotic pneumonia/abscess- ; CT chest Improving- LLL infiltrate- improved; clinically not suggestive of malignancy.  Dr.A.   # B12 deficiency secondary to surgery- continue parental B12 with PCP--STABLE.   # DISPOSITION: # HOLD Venofer # follow up in 6 month- MD-possible Venofer-; labs- 1-2 days prior- cbc/BMP/crp/iron studies/ferritin/ Dr.B  Cc: Dr.Hedrick

## 2020-05-11 NOTE — Progress Notes (Signed)
[facility] OFFICE PROGRESS NOTE  Patient Care Team: [name], MD as PCP - General (Family Medicine)  Cancer Staging No matching staging information was found for the patient.   Oncology History   No history exists.  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 Patient also intolerant of oral iron  -   received parenteral iron therapy with Feraheme 510 mg x 2 doses in May 2014.   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  female pleasant patient above history of Iron deficiency anemia status post partial gastrectomy is here for follow-up.  In the interim patient was evaluated by Dr. Lanney Gins for left lower lobe lung mass-s/p antibiotics.  Patient's symptoms of cough shortness of breath improved.  Appetite is improving.  Patient has gained weight.   Denies any blood in stools or black or stools.   Review of Systems  Constitutional: Positive for malaise/fatigue and weight loss. Negative for chills, diaphoresis and fever.  HENT: Negative for nosebleeds and sore throat.   Eyes: Negative for double vision.  Respiratory: Positive for cough, hemoptysis, sputum production and shortness of breath. Negative for wheezing.   Cardiovascular: Negative for chest pain, palpitations, orthopnea and leg swelling.  Gastrointestinal: Negative for abdominal pain, blood in stool, constipation, diarrhea, heartburn, melena, nausea and vomiting.       Sometimes- dysphagia  Genitourinary: Negative for dysuria, frequency and urgency.  Musculoskeletal: Negative for back pain and joint pain.  Skin: Negative.  Negative for itching and rash.  Neurological: Negative for dizziness, tingling, focal weakness, weakness and headaches.  Endo/Heme/Allergies: Does not bruise/bleed easily.   Psychiatric/Behavioral: Negative for depression. The patient is not nervous/anxious and does not have insomnia.    PAST MEDICAL HISTORY :  Past Medical History:  Diagnosis Date  . Anemia    IRON INFUSIONS  . Arthritis    osteoarthritis/ RA  . Asthma    remote h/o asthma  . Depression    controlled  . H/O hypoglycemia   . H/O: GI bleed   . Headache(784.0)    h/o migraines  . Hearing loss, bilateral    wears hearing aides  . History of blood transfusion    pt states she had yellow jaundice  . Hypertension    H/O NO TX NOW  . Lupus (systemic lupus erythematosus) ([place]) 1995   in remission since 2005  . Nasal sinus congestion   . Osteoporosis with fracture   . Stress fracture of foot    left foot  . Vitamin B 12 deficiency     PAST SURGICAL HISTORY :   Past Surgical History:  Procedure Laterality Date  . ANTERIOR CERVICAL DECOMP/DISCECTOMY FUSION  2009  . BACK SURGERY    . CATARACT EXTRACTION W/PHACO Left 09/30/2016   Procedure: CATARACT EXTRACTION PHACO AND INTRAOCULAR LENS PLACEMENT (IOC);  Surgeon: Birder Robson, MD;  Location: [facility] ORS;  Service: Ophthalmology;  Laterality: Left;  Lot# [id number] H [place]: 00:43.6 AP%: 19.6 CDE: 8.55  . CATARACT EXTRACTION W/PHACO Right 01/11/2019   Procedure: CATARACT EXTRACTION PHACO AND INTRAOCULAR LENS PLACEMENT ([place]) RIGHT;  Surgeon: Birder Robson, MD;  Location: [facility] OR[facility];  Service: Ophthalmology;  Laterality: Right;  [place] 01:03.9 CDE 9.19 Fluid pack Lot # [id number]  . INTRAMEDULLARY (IM) NAIL INTERTROCHANTERIC Right 07/01/2016   Procedure: INTRAMEDULLARY (IM) NAIL EXCHANGE OF FEMORAL ROD;  Surgeon: Hessie Knows, MD;  Location: [facility] ORS;  Service: Orthopedics;  Laterality: Right;  . ORIF FEMUR FRACTURE  2012  . PARTIAL GASTRECTOMY  1963   and reconstruction 1973  . TONSILLECTOMY  1945    FAMILY HISTORY :   Family History  Problem Relation Age of Onset  . Breast cancer Neg Hx     SOCIAL HISTORY:   Social History   Tobacco  Use  . Smoking status: Never Smoker  . Smokeless tobacco: Never Used  Substance Use Topics  . Alcohol use: No  . Drug use: No    ALLERGIES:  is allergic to sulfa antibiotics.  MEDICATIONS:  Current Outpatient Medications  Medication Sig Dispense Refill  . aspirin EC 81 MG tablet Take 81 mg by mouth daily.    . BELSOMRA 10 MG TABS Take 10 mg by mouth at bedtime.   5  . Calcium Carb-Cholecalciferol (CALTRATE 600+D3 PO) Take 1 tablet by mouth 2 (two) times daily.    . cholecalciferol (VITAMIN D) 1000 UNITS tablet Take 1,000 Units by mouth every evening.     . citalopram (CELEXA) 20 MG tablet Take 20 mg by mouth daily at 8 pm. (1900)    . clobetasol (TEMOVATE) 0.05 % external solution MIX IN A TUB OF CERAVE CREAM AND USE EVERYDAY UP TO TWICE DAILY ON ITCHY RASH UNTIL CLEAR, AVOID [place], [place], [place]. 50 mL 0  . cyanocobalamin (,VITAMIN B-12,) 1000 MCG/ML injection Inject 1,000 mcg into the muscle every 30 (thirty) days.     Marland Kitchen denosumab (PROLIA) 60 MG/ML SOSY injection Inject 60 mg into the skin every 6 (six) months.     . Fe Fum-FA-B Cmp-C-Zn-Mg-Mn-Cu (HEMATINIC PLUS VIT/MINERALS) 106-1 MG TABS Take 1 tablet by mouth daily with lunch.   3  . gabapentin (NEURONTIN) 100 MG capsule Take 100-200 mg by mouth See admin instructions. Take 1 capsule (100 mg) by mouth in the morning & take 2 capsules (200 mg) by mouth at night.    Marland Kitchen HYDROcodone-acetaminophen (NORCO/VICODIN) 5-325 MG tablet Take 1 tablet by mouth every 4 (four) hours as needed (pain.).    Marland Kitchen megestrol (MEGACE) 40 MG/ML suspension Take 800 mg by mouth daily.    . meloxicam (MOBIC) 7.5 MG tablet Take 7.5 mg by mouth daily with supper.     . mirtazapine (REMERON) 30 MG tablet Take 30 mg by mouth at bedtime.    . Multiple Vitamins-Minerals (PRESERVISION AREDS) TABS Take 1 tablet by mouth 2 (two) times daily.     . pantoprazole (PROTONIX) 40 MG tablet Take 40 mg by mouth daily before supper.   1  . travoprost, benzalkonium, (TRAVATAN)  0.004 % ophthalmic solution Place 1 drop into both eyes at bedtime.    . vitamin C (ASCORBIC ACID) 500 MG tablet Take 500 mg by mouth 2 (two) times daily.     No current facility-administered medications for this visit.    PHYSICAL EXAMINATION: ECOG PERFORMANCE STATUS: 0 - Asymptomatic  Pulse 90   Temp 97.7 F (36.5 C) (Tympanic)   Resp 18   Ht 4\' 11"  (1.499 m)   Wt 90 lb 12.8 oz (41.2 kg)   SpO2 98%   BMI 18.34 kg/m   Filed Weights   05/11/20 1319  Weight: 90 lb 12.8 oz (41.2 kg)    Physical Exam Constitutional:      Comments: Thin built Caucasian female patient.  HENT:     Head: Normocephalic and atraumatic.     Mouth/Throat:  Pharynx: No oropharyngeal exudate.  Eyes:     Pupils: Pupils are equal, round, and reactive to light.  Cardiovascular:     Rate and Rhythm: Normal rate and regular rhythm.  Pulmonary:     Effort: Pulmonary effort is normal. No respiratory distress.     Breath sounds: Normal breath sounds. No wheezing.  Abdominal:     General: Bowel sounds are normal. There is no distension.     Palpations: Abdomen is soft. There is no mass.     Tenderness: There is no abdominal tenderness. There is no guarding or rebound.  Musculoskeletal:        General: No tenderness. Normal range of motion.     Cervical back: Normal range of motion and neck supple.  Skin:    General: Skin is warm.  Neurological:     Mental Status: She is alert and oriented to person, place, and time.  Psychiatric:        Mood and Affect: Affect normal.      LABORATORY DATA:  I have reviewed the data as listed    Component Value Date/Time   NA 142 10/06/2019 0902   NA 142 02/09/2013 0515   K 4.3 10/06/2019 0902   K 4.0 02/09/2013 0515   CL 112 (H) 10/06/2019 0902   CL 113 (H) 02/09/2013 0515   CO2 20 (L) 10/06/2019 0902   CO2 25 02/09/2013 0515   GLUCOSE 104 (H) 10/06/2019 0902   GLUCOSE 80 02/09/2013 0515   BUN 13 10/06/2019 0902   BUN 20 (H) 02/09/2013 0515    CREATININE 1.10 (H) 02/16/2020 0947   CREATININE 0.58 (L) 02/09/2013 0515   CALCIUM 8.4 (L) 10/06/2019 0902   CALCIUM 7.5 (L) 02/09/2013 0515   PROT 7.6 10/06/2019 0902   PROT 6.8 02/08/2013 1558   ALBUMIN 2.9 (L) 10/06/2019 0902   ALBUMIN 3.8 02/08/2013 1558   AST 25 10/06/2019 0902   AST 16 02/08/2013 1558   ALT 28 10/06/2019 0902   ALT 12 02/08/2013 1558   ALKPHOS 75 10/06/2019 0902   ALKPHOS 53 02/08/2013 1558   BILITOT 0.3 10/06/2019 0902   BILITOT 0.2 02/08/2013 1558   GFRNONAA >60 10/06/2019 0902   GFRNONAA >60 02/09/2013 0515   GFRAA >60 10/06/2019 0902   GFRAA >60 02/09/2013 0515    No results found for: SPEP, UPEP  Lab Results  Component Value Date   WBC 10.5 05/09/2020   NEUTROABS 7.2 05/09/2020   HGB 13.8 05/09/2020   HCT 40.5 05/09/2020   MCV 97.4 05/09/2020   PLT 326 05/09/2020      Chemistry      Component Value Date/Time   NA 142 10/06/2019 0902   NA 142 02/09/2013 0515   K 4.3 10/06/2019 0902   K 4.0 02/09/2013 0515   CL 112 (H) 10/06/2019 0902   CL 113 (H) 02/09/2013 0515   CO2 20 (L) 10/06/2019 0902   CO2 25 02/09/2013 0515   BUN 13 10/06/2019 0902   BUN 20 (H) 02/09/2013 0515   CREATININE 1.10 (H) 02/16/2020 0947   CREATININE 0.58 (L) 02/09/2013 0515      Component Value Date/Time   CALCIUM 8.4 (L) 10/06/2019 0902   CALCIUM 7.5 (L) 02/09/2013 0515   ALKPHOS 75 10/06/2019 0902   ALKPHOS 53 02/08/2013 1558   AST 25 10/06/2019 0902   AST 16 02/08/2013 1558   ALT 28 10/06/2019 0902   ALT 12 02/08/2013 1558   BILITOT 0.3 10/06/2019 0902   BILITOT  0.2 02/08/2013 1558       RADIOGRAPHIC STUDIES: I have personally reviewed the radiological images as listed and agreed with the findings in the report. No results found.   ASSESSMENT & PLAN:  Iron deficiency anemia following bariatric surgery # Iron deficient anemia-s/p partial gastrectomy-hemoglobin stable-13.8 saturation 27%; hold off IV iron;.  # Left lower lobe necrotic  pneumonia/abscess- ; CT chest Improving- LLL infiltrate- improved; clinically not suggestive of malignancy.  Dr.A.   # B12 deficiency secondary to surgery- continue parental B12 with PCP--STABLE.   # DISPOSITION: # HOLD Venofer # follow up in 6 month- MD-possible Venofer-; labs- 1-2 days prior- cbc/BMP/crp/iron studies/ferritin/ Dr.B  Cc: Dr.Hedrick   Orders Placed This Encounter  Procedures  . CBC with Differential    Standing Status:   Future    Standing Expiration Date:   05/11/2021  . Basic metabolic panel    Standing Status:   Future    Standing Expiration Date:   05/11/2021  . C-reactive protein    Standing Status:   Future    Standing Expiration Date:   05/11/2021  . Iron and TIBC    Standing Status:   Future    Standing Expiration Date:   05/11/2021  . Ferritin    Standing Status:   Future    Standing Expiration Date:   05/11/2021   All questions were answered. The patient knows to call the clinic with any problems, questions or concerns.      Cammie Sickle, MD 05/14/2020 8:21 AM

## 2020-05-11 NOTE — Progress Notes (Signed)
The patient c/o weakness

## 2020-07-31 ENCOUNTER — Encounter: Payer: Medicare PPO | Attending: Pulmonary Disease

## 2020-07-31 ENCOUNTER — Other Ambulatory Visit: Payer: Self-pay

## 2020-07-31 DIAGNOSIS — J45909 Unspecified asthma, uncomplicated: Secondary | ICD-10-CM | POA: Insufficient documentation

## 2020-07-31 NOTE — Progress Notes (Signed)
Virtual Visit completed. Patient informed on EP and RD appointment and 6 Minute walk test. Patient also informed of patient health questionnaires on My Chart. Patient Verbalizes understanding. Visit diagnosis can be found in [facility] 07/26/2020. 

## 2020-08-02 ENCOUNTER — Other Ambulatory Visit: Payer: Self-pay

## 2020-08-02 DIAGNOSIS — J45909 Unspecified asthma, uncomplicated: Secondary | ICD-10-CM

## 2020-08-02 NOTE — Patient Instructions (Signed)
Patient Instructions  Patient Details  Name: Renee Cabrera MRN: [id number] Date of Birth: Dec 23, 1940 Referring Provider:  Ottie Glazier, MD  Below are your personal goals for exercise, nutrition, and risk factors. Our goal is to help you stay on track towards obtaining and maintaining these goals. We will be discussing your progress on these goals with you throughout the program.  Initial Exercise Prescription:  Initial Exercise Prescription - 08/02/20 1600      Date of Initial Exercise RX and Referring Provider   Date 08/02/20    Referring Provider Ottie Glazier MD      Treadmill   MPH 2.3    Grade 0.5    Minutes 15    METs 2.92      Recumbant Bike   Level 2    RPM 60    Watts 10    Minutes 15    METs 2.69      NuStep   Level 3    SPM 80    Minutes 15    METs 2.69      Prescription Details   Frequency (times per week) 2    Duration Progress to 30 minutes of continuous aerobic without signs/symptoms of physical distress      Intensity   THRR 40-80% of Max Heartrate 107-129    Ratings of Perceived Exertion 11-13    Perceived Dyspnea 0-4      Resistance Training   Training Prescription Yes           Exercise Goals: Frequency: Be able to perform aerobic exercise two to three times per week in program working toward 2-5 days per week of home exercise.  Intensity: Work with a perceived exertion of 11 (fairly light) - 15 (hard) while following your exercise prescription.  We will make changes to your prescription with you as you progress through the program.   Duration: Be able to do 30 to 45 minutes of continuous aerobic exercise in addition to a 5 minute warm-up and a 5 minute cool-down routine.   Nutrition Goals: Your personal nutrition goals will be established when you do your nutrition analysis with the dietician.  The following are general nutrition guidelines to follow: Cholesterol < 200mg /day Sodium < 1500mg /day Fiber: Women over 50 yrs - 21  grams per day  Personal Goals:  Personal Goals and Risk Factors at Admission - 08/02/20 1631      Core Components/Risk Factors/Patient Goals on Admission    Weight Management Yes;Weight Maintenance    Intervention Weight Management: Provide education and appropriate resources to help participant work on and attain dietary goals.;Weight Management/Obesity: Establish reasonable short term and long term weight goals.;Weight Management: Develop a combined nutrition and exercise program designed to reach desired caloric intake, while maintaining appropriate intake of nutrient and fiber, sodium and fats, and appropriate energy expenditure required for the weight goal.    Admit Weight 92 lb 11.2 oz (42 kg)    Goal Weight: Short Term 92 lb 11.2 oz (42 kg)    Goal Weight: Long Term 92 lb 11.2 oz (42 kg)    Expected Outcomes Short Term: Continue to assess and modify interventions until short term weight is achieved;Long Term: Adherence to nutrition and physical activity/exercise program aimed toward attainment of established weight goal;Weight Maintenance: Understanding of the daily nutrition guidelines, which includes 25-35% calories from fat, 7% or less cal from saturated fats, less than 200mg  cholesterol, less than 1.5gm of sodium, & 5 or more servings of fruits and vegetables  daily;Understanding recommendations for meals to include 15-35% energy as protein, 25-35% energy from fat, 35-60% energy from carbohydrates, less than 200mg  of dietary cholesterol, 20-35 gm of total fiber daily;Understanding of distribution of calorie intake throughout the day with the consumption of 4-5 meals/snacks    Improve shortness of breath with ADL's Yes    Intervention Provide education, individualized exercise plan and daily activity instruction to help decrease symptoms of SOB with activities of daily living.    Expected Outcomes Short Term: Improve cardiorespiratory fitness to achieve a reduction of symptoms when performing  ADLs;Long Term: Be able to perform more ADLs without symptoms or delay the onset of symptoms    Hypertension Yes    Intervention Provide education on lifestyle modifcations including regular physical activity/exercise, weight management, moderate sodium restriction and increased consumption of fresh fruit, vegetables, and low fat dairy, alcohol moderation, and smoking cessation.;Monitor prescription use compliance.    Expected Outcomes Short Term: Continued assessment and intervention until BP is < 140/24mm HG in hypertensive participants. < 130/22mm HG in hypertensive participants with diabetes, heart failure or chronic kidney disease.;Long Term: Maintenance of blood pressure at goal levels.           Tobacco Use Initial Evaluation: Social History   Tobacco Use  Smoking Status Never Smoker  Smokeless Tobacco Never Used    Exercise Goals and Review:  Exercise Goals    Row Name 08/02/20 1630             Exercise Goals   Increase Physical Activity Yes       Intervention Provide advice, education, support and counseling about physical activity/exercise needs.;Develop an individualized exercise prescription for aerobic and resistive training based on initial evaluation findings, risk stratification, comorbidities and participant's personal goals.       Expected Outcomes Short Term: Attend rehab on a regular basis to increase amount of physical activity.;Long Term: Add in home exercise to make exercise part of routine and to increase amount of physical activity.;Long Term: Exercising regularly at least 3-5 days a week.       Increase Strength and Stamina Yes       Intervention Provide advice, education, support and counseling about physical activity/exercise needs.;Develop an individualized exercise prescription for aerobic and resistive training based on initial evaluation findings, risk stratification, comorbidities and participant's personal goals.       Expected Outcomes Short Term:  Increase workloads from initial exercise prescription for resistance, speed, and METs.;Short Term: Perform resistance training exercises routinely during rehab and add in resistance training at home;Long Term: Improve cardiorespiratory fitness, muscular endurance and strength as measured by increased METs and functional capacity (6MWT)       Able to understand and use rate of perceived exertion (RPE) scale Yes       Intervention Provide education and explanation on how to use RPE scale       Expected Outcomes Short Term: Able to use RPE daily in rehab to express subjective intensity level;Long Term:  Able to use RPE to guide intensity level when exercising independently       Able to understand and use Dyspnea scale Yes       Intervention Provide education and explanation on how to use Dyspnea scale       Expected Outcomes Short Term: Able to use Dyspnea scale daily in rehab to express subjective sense of shortness of breath during exertion;Long Term: Able to use Dyspnea scale to guide intensity level when exercising independently  Knowledge and understanding of Target Heart Rate Range (THRR) Yes       Intervention Provide education and explanation of THRR including how the numbers were predicted and where they are located for reference       Expected Outcomes Short Term: Able to state/look up THRR;Short Term: Able to use daily as guideline for intensity in rehab;Long Term: Able to use THRR to govern intensity when exercising independently       Able to check pulse independently Yes       Intervention Provide education and demonstration on how to check pulse in carotid and radial arteries.;Review the importance of being able to check your own pulse for safety during independent exercise       Expected Outcomes Short Term: Able to explain why pulse checking is important during independent exercise;Long Term: Able to check pulse independently and accurately       Understanding of Exercise  Prescription Yes       Intervention Provide education, explanation, and written materials on patient's individual exercise prescription       Expected Outcomes Short Term: Able to explain program exercise prescription;Long Term: Able to explain home exercise prescription to exercise independently              Copy of goals given to participant.

## 2020-08-02 NOTE — Progress Notes (Addendum)
Pulmonary Individual Treatment Plan  Patient Details  Name: Renee Cabrera MRN: [id number] Date of Birth: 11-06-1940 Referring Provider:     Pulmonary Rehab from 08/02/2020 in [facility] Cardiac and Pulmonary Rehab  Referring Provider Ottie Glazier MD      Initial Encounter Date:    Pulmonary Rehab from 08/02/2020 in [facility] Cardiac and Pulmonary Rehab  Date 08/02/20      Visit Diagnosis: Uncomplicated asthma, unspecified asthma severity, unspecified whether persistent  Patient's Home Medications on Admission:  Current Outpatient Medications:  .  albuterol (VENTOLIN HFA) 108 (90 Base) MCG/ACT inhaler, Inhale into the lungs., Disp: , Rfl:  .  ascorbic Acid (VITAMIN C) 500 MG CPCR, Take by mouth., Disp: , Rfl:  .  aspirin EC 81 MG tablet, Take 81 mg by mouth daily., Disp: , Rfl:  .  BELSOMRA 10 MG TABS, Take 10 mg by mouth at bedtime. , Disp: , Rfl: 5 .  Calcium Carb-Cholecalciferol (CALTRATE 600+D3 PO), Take 1 tablet by mouth 2 (two) times daily., Disp: , Rfl:  .  Calcium Carbonate-Vitamin D 600-400 MG-UNIT tablet, Take by mouth., Disp: , Rfl:  .  cholecalciferol (VITAMIN D) 1000 UNITS tablet, Take 1,000 Units by mouth every evening. , Disp: , Rfl:  .  citalopram (CELEXA) 20 MG tablet, Take 20 mg by mouth daily at 8 pm. (1900), Disp: , Rfl:  .  citalopram (CELEXA) 20 MG tablet, Take 1 tablet by mouth daily., Disp: , Rfl:  .  clobetasol (TEMOVATE) 0.05 % external solution, MIX IN A TUB OF CERAVE CREAM AND USE EVERYDAY UP TO TWICE DAILY ON ITCHY RASH UNTIL CLEAR, AVOID FACE, GROIN, AXILLAA., Disp: 50 mL, Rfl: 0 .  cyanocobalamin (,VITAMIN B-12,) 1000 MCG/ML injection, Inject 1,000 mcg into the muscle every 30 (thirty) days. , Disp: , Rfl:  .  denosumab (PROLIA) 60 MG/ML SOSY injection, Inject 60 mg into the skin every 6 (six) months. , Disp: , Rfl:  .  Fe Fum-FA-B Cmp-C-Zn-Mg-Mn-Cu (HEMATINIC PLUS VIT/MINERALS) 106-1 MG TABS, Take 1 tablet by mouth daily with lunch. , Disp: , Rfl: 3 .  Fe  Fum-FA-B Cmp-C-Zn-Mg-Mn-Cu (HEMATINIC PLUS VIT/MINERALS) 106-1 MG TABS, Take 1 tablet by mouth daily., Disp: , Rfl:  .  gabapentin (NEURONTIN) 100 MG capsule, Take 100-200 mg by mouth See admin instructions. Take 1 capsule (100 mg) by mouth in the morning & take 2 capsules (200 mg) by mouth at night., Disp: , Rfl:  .  HYDROcodone-acetaminophen (NORCO/VICODIN) 5-325 MG tablet, Take 1 tablet by mouth every 4 (four) hours as needed (pain.)., Disp: , Rfl:  .  megestrol (MEGACE) 40 MG/ML suspension, Take 800 mg by mouth daily., Disp: , Rfl:  .  megestrol (MEGACE) 40 MG/ML suspension, Take by mouth., Disp: , Rfl:  .  meloxicam (MOBIC) 7.5 MG tablet, Take 7.5 mg by mouth daily with supper. , Disp: , Rfl:  .  meloxicam (MOBIC) 7.5 MG tablet, Take 1 tablet by mouth daily., Disp: , Rfl:  .  mirtazapine (REMERON) 30 MG tablet, Take 30 mg by mouth at bedtime., Disp: , Rfl:  .  mirtazapine (REMERON) 45 MG tablet, Take 1 tablet by mouth at bedtime., Disp: , Rfl:  .  mirtazapine (REMERON) 45 MG tablet, , Disp: , Rfl:  .  Multiple Vitamins-Minerals (PRESERVISION AREDS) TABS, Take 1 tablet by mouth 2 (two) times daily. , Disp: , Rfl:  .  pantoprazole (PROTONIX) 40 MG tablet, Take 40 mg by mouth daily before supper. , Disp: , Rfl: 1 .  pantoprazole (PROTONIX) 40 MG tablet, Take 1 tablet by mouth daily., Disp: , Rfl:  .  Suvorexant (BELSOMRA) 10 MG TABS, Take 1 tablet by mouth daily., Disp: , Rfl:  .  Travoprost, BAK Free, (TRAVATAN) 0.004 % SOLN ophthalmic solution, SMARTSIG:1 Drop(s) In Eye(s) Every Evening, Disp: , Rfl:  .  travoprost, benzalkonium, (TRAVATAN) 0.004 % ophthalmic solution, Place 1 drop into both eyes at bedtime., Disp: , Rfl:  .  vitamin C (ASCORBIC ACID) 500 MG tablet, Take 500 mg by mouth 2 (two) times daily., Disp: , Rfl:   Past Medical History: Past Medical History:  Diagnosis Date  . Anemia    IRON INFUSIONS  . Arthritis    osteoarthritis/ RA  . Asthma    remote h/o asthma  .  Depression    controlled  . H/O hypoglycemia   . H/O: GI bleed   . Headache(784.0)    h/o migraines  . Hearing loss, bilateral    wears hearing aides  . History of blood transfusion    pt states she had yellow jaundice  . Hypertension    H/O NO TX NOW  . Lupus (systemic lupus erythematosus) ([place]) 1995   in remission since 2005  . Nasal sinus congestion   . Osteoporosis with fracture   . Stress fracture of foot    left foot  . Vitamin B 12 deficiency     Tobacco Use: Social History   Tobacco Use  Smoking Status Never Smoker  Smokeless Tobacco Never Used    Labs: Recent Review Flowsheet Data   There is no flowsheet data to display.      Pulmonary Assessment Scores:  Pulmonary Assessment Scores    Row Name 08/02/20 1609         ADL UCSD   ADL Phase Entry     SOB Score total 17     Rest 0     Walk 1     Stairs 3     Bath 0     Dress 0     Shop 0       CAT Score   CAT Score 8       mMRC Score   mMRC Score 1            UCSD: Self-administered rating of dyspnea associated with activities of daily living (ADLs) 6-point scale (0 = "not at all" to 5 = "maximal or unable to do because of breathlessness")  Scoring Scores range from 0 to 120.  Minimally important difference is 5 units  CAT: CAT can identify the health impairment of COPD patients and is better correlated with disease progression.  CAT has a scoring range of zero to 40. The CAT score is classified into four groups of low (less than 10), medium (10 - 20), high (21-30) and very high (31-40) based on the impact level of disease on health status. A CAT score over 10 suggests significant symptoms.  A worsening CAT score could be explained by an exacerbation, poor medication adherence, poor inhaler technique, or progression of COPD or comorbid conditions.  CAT MCID is 2 points  mMRC: mMRC (Modified Medical Research Council) Dyspnea Scale is used to assess the degree of baseline functional disability  in patients of respiratory disease due to dyspnea. No minimal important difference is established. A decrease in score of 1 point or greater is considered a positive change.   Pulmonary Function Assessment:  Pulmonary Function Assessment - 07/31/20 0940  Breath   Shortness of Breath Yes;Limiting activity           Exercise Target Goals: Exercise Program Goal: Individual exercise prescription set using results from initial 6 min walk test and THRR while considering  patient's activity barriers and safety.   Exercise Prescription Goal: Initial exercise prescription builds to 30-45 minutes a day of aerobic activity, 2-3 days per week.  Home exercise guidelines will be given to patient during program as part of exercise prescription that the participant will acknowledge.  Education: Aerobic Exercise & Resistance Training: - Gives group verbal and written instruction on the various components of exercise. Focuses on aerobic and resistive training programs and the benefits of this training and how to safely progress through these programs..   Education: Exercise & Equipment Safety: - Individual verbal instruction and demonstration of equipment use and safety with use of the equipment.   Pulmonary Rehab from 07/31/2020 in [facility] Cardiac and Pulmonary Rehab  Date 07/31/20  Educator [facility]  Instruction Review Code 1- Verbalizes Understanding      Education: Exercise Physiology & General Exercise Guidelines: - Group verbal and written instruction with models to review the exercise physiology of the cardiovascular system and associated critical values. Provides general exercise guidelines with specific guidelines to those with heart or lung disease.    Education: Flexibility, Balance, Mind/Body Relaxation: Provides group verbal/written instruction on the benefits of flexibility and balance training, including mind/body exercise modes such as yoga, pilates and tai chi.  Demonstration and skill  practice provided.   Activity Barriers & Risk Stratification:  Activity Barriers & Cardiac Risk Stratification - 08/02/20 1620      Activity Barriers & Cardiac Risk Stratification   Activity Barriers Other (comment)    Comments 'Rod" placed in right leg           6 Minute Walk:  6 Minute Walk    Row Name 08/02/20 1612         6 Minute Walk   Phase Initial     Distance 1195 feet     Walk Time 6 minutes     # of Rest Breaks 9     MPH 2.26     METS 2.69     RPE 9     Perceived Dyspnea  0     VO2 Peak 9.44     Symptoms No     Resting HR 85 bpm     Resting BP 132/78     Resting Oxygen Saturation  98 %     Exercise Oxygen Saturation  during 6 min walk 93 %     Max Ex. HR 100 bpm     Max Ex. BP 158/82     2 Minute Post BP 136/80       Interval HR   1 Minute HR 94     2 Minute HR 95     3 Minute HR 99     4 Minute HR 96     5 Minute HR 97     6 Minute HR 100     2 Minute Post HR 82     Interval Heart Rate? Yes       Interval Oxygen   Interval Oxygen? Yes     Baseline Oxygen Saturation % 98 %     1 Minute Oxygen Saturation % 96 %     1 Minute Liters of Oxygen 0 L  RA     2 Minute Oxygen Saturation % 96 %  2 Minute Liters of Oxygen 0 L     3 Minute Oxygen Saturation % 93 %     3 Minute Liters of Oxygen 0 L     4 Minute Oxygen Saturation % 94 %     4 Minute Liters of Oxygen 0 L     5 Minute Oxygen Saturation % 96 %     5 Minute Liters of Oxygen 0 L     6 Minute Oxygen Saturation % 93 %     6 Minute Liters of Oxygen 0 L     2 Minute Post Oxygen Saturation % 97 %     2 Minute Post Liters of Oxygen 0 L           Oxygen Initial Assessment:  Oxygen Initial Assessment - 08/02/20 1609      Home Oxygen   Home Oxygen Device None    Sleep Oxygen Prescription None    Home Exercise Oxygen Prescription None    Home Resting Oxygen Prescription None      Initial 6 min Walk   Oxygen Used None      Program Oxygen Prescription   Program Oxygen Prescription  None      Intervention   Short Term Goals To learn and understand importance of monitoring SPO2 with pulse oximeter and demonstrate accurate use of the pulse oximeter.;To learn and understand importance of maintaining oxygen saturations>88%;To learn and demonstrate proper pursed lip breathing techniques or other breathing techniques.;To learn and demonstrate proper use of respiratory medications    Long  Term Goals Verbalizes importance of monitoring SPO2 with pulse oximeter and return demonstration;Maintenance of O2 saturations>88%;Exhibits proper breathing techniques, such as pursed lip breathing or other method taught during program session;Compliance with respiratory medication;Demonstrates proper use of MDI's           Oxygen Re-Evaluation:   Oxygen Discharge (Final Oxygen Re-Evaluation):   Initial Exercise Prescription:  Initial Exercise Prescription - 08/02/20 1600      Date of Initial Exercise RX and Referring Provider   Date 08/02/20    Referring Provider Ottie Glazier MD      Treadmill   MPH 2.3    Grade 0.5    Minutes 15    METs 2.92      Recumbant Bike   Level 2    RPM 60    Watts 10    Minutes 15    METs 2.69      NuStep   Level 3    SPM 80    Minutes 15    METs 2.69      Prescription Details   Frequency (times per week) 2    Duration Progress to 30 minutes of continuous aerobic without signs/symptoms of physical distress      Intensity   THRR 40-80% of Max Heartrate 107-129    Ratings of Perceived Exertion 11-13    Perceived Dyspnea 0-4      Resistance Training   Training Prescription Yes           Perform Capillary Blood Glucose checks as needed.  Exercise Prescription Changes:   Exercise Prescription Changes    Row Name 08/02/20 1600             Response to Exercise   Blood Pressure (Admit) 132/78       Blood Pressure (Exercise) 158/82       Blood Pressure (Exit) 130/78       Heart Rate (Admit) 85 bpm  Heart Rate  (Exercise) 100 bpm       Heart Rate (Exit) 82 bpm       Oxygen Saturation (Admit) 98 %       Oxygen Saturation (Exercise) 93 %       Oxygen Saturation (Exit) 98 %       Rating of Perceived Exertion (Exercise) 9       Perceived Dyspnea (Exercise) 0       Symptoms none       Comments walk test results       Duration Progress to 30 minutes of  aerobic without signs/symptoms of physical distress         Resistance Training   Weight 3 lb       Reps 10-15              Exercise Comments:   Exercise Goals and Review:   Exercise Goals    Row Name 08/02/20 1630             Exercise Goals   Increase Physical Activity Yes       Intervention Provide advice, education, support and counseling about physical activity/exercise needs.;Develop an individualized exercise prescription for aerobic and resistive training based on initial evaluation findings, risk stratification, comorbidities and participant's personal goals.       Expected Outcomes Short Term: Attend rehab on a regular basis to increase amount of physical activity.;Long Term: Add in home exercise to make exercise part of routine and to increase amount of physical activity.;Long Term: Exercising regularly at least 3-5 days a week.       Increase Strength and Stamina Yes       Intervention Provide advice, education, support and counseling about physical activity/exercise needs.;Develop an individualized exercise prescription for aerobic and resistive training based on initial evaluation findings, risk stratification, comorbidities and participant's personal goals.       Expected Outcomes Short Term: Increase workloads from initial exercise prescription for resistance, speed, and METs.;Short Term: Perform resistance training exercises routinely during rehab and add in resistance training at home;Long Term: Improve cardiorespiratory fitness, muscular endurance and strength as measured by increased METs and functional capacity (6MWT)         Able to understand and use rate of perceived exertion (RPE) scale Yes       Intervention Provide education and explanation on how to use RPE scale       Expected Outcomes Short Term: Able to use RPE daily in rehab to express subjective intensity level;Long Term:  Able to use RPE to guide intensity level when exercising independently       Able to understand and use Dyspnea scale Yes       Intervention Provide education and explanation on how to use Dyspnea scale       Expected Outcomes Short Term: Able to use Dyspnea scale daily in rehab to express subjective sense of shortness of breath during exertion;Long Term: Able to use Dyspnea scale to guide intensity level when exercising independently       Knowledge and understanding of Target Heart Rate Range (THRR) Yes       Intervention Provide education and explanation of THRR including how the numbers were predicted and where they are located for reference       Expected Outcomes Short Term: Able to state/look up THRR;Short Term: Able to use daily as guideline for intensity in rehab;Long Term: Able to use THRR to govern intensity when exercising independently  Able to check pulse independently Yes       Intervention Provide education and demonstration on how to check pulse in carotid and radial arteries.;Review the importance of being able to check your own pulse for safety during independent exercise       Expected Outcomes Short Term: Able to explain why pulse checking is important during independent exercise;Long Term: Able to check pulse independently and accurately       Understanding of Exercise Prescription Yes       Intervention Provide education, explanation, and written materials on patient's individual exercise prescription       Expected Outcomes Short Term: Able to explain program exercise prescription;Long Term: Able to explain home exercise prescription to exercise independently              Exercise Goals Re-Evaluation  :   Discharge Exercise Prescription (Final Exercise Prescription Changes):  Exercise Prescription Changes - 08/02/20 1600      Response to Exercise   Blood Pressure (Admit) 132/78    Blood Pressure (Exercise) 158/82    Blood Pressure (Exit) 130/78    Heart Rate (Admit) 85 bpm    Heart Rate (Exercise) 100 bpm    Heart Rate (Exit) 82 bpm    Oxygen Saturation (Admit) 98 %    Oxygen Saturation (Exercise) 93 %    Oxygen Saturation (Exit) 98 %    Rating of Perceived Exertion (Exercise) 9    Perceived Dyspnea (Exercise) 0    Symptoms none    Comments walk test results    Duration Progress to 30 minutes of  aerobic without signs/symptoms of physical distress      Resistance Training   Weight 3 lb    Reps 10-15           Nutrition:  Target Goals: Understanding of nutrition guidelines, daily intake of sodium '1500mg'$ , cholesterol '200mg'$ , calories 30% from fat and 7% or less from saturated fats, daily to have 5 or more servings of fruits and vegetables.  Education: Controlling Sodium/Reading Food Labels -Group verbal and written material supporting the discussion of sodium use in heart healthy nutrition. Review and explanation with models, verbal and written materials for utilization of the food label.   Education: General Nutrition Guidelines/Fats and Fiber: -Group instruction provided by verbal, written material, models and posters to present the general guidelines for heart healthy nutrition. Gives an explanation and review of dietary fats and fiber.   Biometrics:    Nutrition Therapy Plan and Nutrition Goals:   Nutrition Assessments:  Nutrition Assessments - 08/02/20 1609      MEDFICTS Scores   Pre Score 66           MEDIFICTS Score Key:          ?70 Need to make dietary changes          40-70 Heart Healthy Diet         ? 40 Therapeutic Level Cholesterol Diet  Nutrition Goals Re-Evaluation:   Nutrition Goals Discharge (Final Nutrition Goals  Re-Evaluation):   Psychosocial: Target Goals: Acknowledge presence or absence of significant depression and/or stress, maximize coping skills, provide positive support system. Participant is able to verbalize types and ability to use techniques and skills needed for reducing stress and depression.   Education: Depression - Provides group verbal and written instruction on the correlation between heart/lung disease and depressed mood, treatment options, and the stigmas associated with seeking treatment.   Education: Sleep Hygiene -Provides group verbal and written instruction  about how sleep can affect your health.  Define sleep hygiene, discuss sleep cycles and impact of sleep habits. Review good sleep hygiene tips.    Education: Stress and Anxiety: - Provides group verbal and written instruction about the health risks of elevated stress and causes of high stress.  Discuss the correlation between heart/lung disease and anxiety and treatment options. Review healthy ways to manage with stress and anxiety.   Initial Review & Psychosocial Screening:  Initial Psych Review & Screening - 07/31/20 0943      Initial Review   Current issues with Current Psychotropic Meds;Current Stress Concerns    Comments Her back bothers her and is wanting to do more exercise to help with mood.      Family Dynamics   Good Support System? Yes    Comments She can look to her cousin and friend for support.      Barriers   Psychosocial barriers to participate in program The patient should benefit from training in stress management and relaxation.      Screening Interventions   Interventions Encouraged to exercise;To provide support and resources with identified psychosocial needs;Provide feedback about the scores to participant    Expected Outcomes Short Term goal: Utilizing psychosocial counselor, staff and physician to assist with identification of specific Stressors or current issues interfering with healing  process. Setting desired goal for each stressor or current issue identified.;Long Term Goal: Stressors or current issues are controlled or eliminated.;Short Term goal: Identification and review with participant of any Quality of Life or Depression concerns found by scoring the questionnaire.;Long Term goal: The participant improves quality of Life and PHQ9 Scores as seen by post scores and/or verbalization of changes           Quality of Life Scores:  Scores of 19 and below usually indicate a poorer quality of life in these areas.  A difference of  2-3 points is a clinically meaningful difference.  A difference of 2-3 points in the total score of the Quality of Life Index has been associated with significant improvement in overall quality of life, self-image, physical symptoms, and general health in studies assessing change in quality of life.  PHQ-9: Recent Review Flowsheet Data    Depression screen [facility] 2/9 08/02/2020   Decreased Interest 0   Down, Depressed, Hopeless 0   PHQ - 2 Score 0   Altered sleeping 0   Tired, decreased energy 1   Change in appetite 1   Feeling bad or failure about yourself  0   Trouble concentrating 0   Moving slowly or fidgety/restless 0   Suicidal thoughts 0   PHQ-9 Score 2   Difficult doing work/chores Not difficult at all     Interpretation of Total Score  Total Score Depression Severity:  1-4 = Minimal depression, 5-9 = Mild depression, 10-14 = Moderate depression, 15-19 = Moderately severe depression, 20-27 = Severe depression   Psychosocial Evaluation and Intervention:  Psychosocial Evaluation - 07/31/20 0945      Psychosocial Evaluation & Interventions   Interventions Encouraged to exercise with the program and follow exercise prescription;Relaxation education;Stress management education    Comments She can look to her cousin and friend for support. She states that with everything that is going on with COVID is stressful.    Expected Outcomes  Short: Exercise regularly to support mental health and notify staff of any changes. Long: maintain mental health and well being through teaching of rehab or prescribed medications independently.  Continue Psychosocial Services  Follow up required by staff           Psychosocial Re-Evaluation:   Psychosocial Discharge (Final Psychosocial Re-Evaluation):   Education: Education Goals: Education classes will be provided on a weekly basis, covering required topics. Participant will state understanding/return demonstration of topics presented.  Learning Barriers/Preferences:  Learning Barriers/Preferences - 07/31/20 0941      Learning Barriers/Preferences   Learning Barriers Hearing    Learning Preferences None           General Pulmonary Education Topics:  Infection Prevention: - Provides verbal and written material to individual with discussion of infection control including proper hand washing and proper equipment cleaning during exercise session.   Pulmonary Rehab from 07/31/2020 in [facility] Cardiac and Pulmonary Rehab  Date 07/31/20  Educator [facility]  Instruction Review Code 1- Verbalizes Understanding      Falls Prevention: - Provides verbal and written material to individual with discussion of falls prevention and safety.   Pulmonary Rehab from 07/31/2020 in [facility] Cardiac and Pulmonary Rehab  Date 07/31/20  Educator [facility]  Instruction Review Code 1- Verbalizes Understanding      Chronic Lung Diseases: - Group verbal and written instruction to review updates, respiratory medications, advancements in procedures and treatments. Discuss use of supplemental oxygen including available portable oxygen systems, continuous and intermittent flow rates, concentrators, personal use and safety guidelines. Review proper use of inhaler and spacers. Provide informative websites for self-education.    Energy Conservation: - Provide group verbal and written instruction for methods to conserve  energy, plan and organize activities. Instruct on pacing techniques, use of adaptive equipment and posture/positioning to relieve shortness of breath.   Triggers and Exacerbations: - Group verbal and written instruction to review types of environmental triggers and ways to prevent exacerbations. Discuss weather changes, air quality and the benefits of nasal washing. Review warning signs and symptoms to help prevent infections. Discuss techniques for effective airway clearance, coughing, and vibrations.   AED/CPR: - Group verbal and written instruction with the use of models to demonstrate the basic use of the AED with the basic ABC's of resuscitation.   Anatomy and Physiology of the Lungs: - Group verbal and written instruction with the use of models to provide basic lung anatomy and physiology related to function, structure and complications of lung disease.   Anatomy & Physiology of the Heart: - Group verbal and written instruction and models provide basic cardiac anatomy and physiology, with the coronary electrical and arterial systems. Review of Valvular disease and Heart Failure   Cardiac Medications: - Group verbal and written instruction to review commonly prescribed medications for heart disease. Reviews the medication, class of the drug, and side effects.   Other: -Provides group and verbal instruction on various topics (see comments)   Knowledge Questionnaire Score:  Knowledge Questionnaire Score - 08/02/20 1611      Knowledge Questionnaire Score   Pre Score 14/18: Oxygen, ADLs,            Core Components/Risk Factors/Patient Goals at Admission:  Personal Goals and Risk Factors at Admission - 08/02/20 1631      Core Components/Risk Factors/Patient Goals on Admission    Weight Management Yes;Weight Maintenance    Intervention Weight Management: Provide education and appropriate resources to help participant work on and attain dietary goals.;Weight  Management/Obesity: Establish reasonable short term and long term weight goals.;Weight Management: Develop a combined nutrition and exercise program designed to reach desired caloric intake, while maintaining appropriate intake  of nutrient and fiber, sodium and fats, and appropriate energy expenditure required for the weight goal.    Admit Weight 92 lb 11.2 oz (42 kg)    Goal Weight: Short Term 92 lb 11.2 oz (42 kg)    Goal Weight: Long Term 92 lb 11.2 oz (42 kg)    Expected Outcomes Short Term: Continue to assess and modify interventions until short term weight is achieved;Long Term: Adherence to nutrition and physical activity/exercise program aimed toward attainment of established weight goal;Weight Maintenance: Understanding of the daily nutrition guidelines, which includes 25-35% calories from fat, 7% or less cal from saturated fats, less than $RemoveB'200mg'aUKPhqii$  cholesterol, less than 1.5gm of sodium, & 5 or more servings of fruits and vegetables daily;Understanding recommendations for meals to include 15-35% energy as protein, 25-35% energy from fat, 35-60% energy from carbohydrates, less than $RemoveB'200mg'iLprRCtk$  of dietary cholesterol, 20-35 gm of total fiber daily;Understanding of distribution of calorie intake throughout the day with the consumption of 4-5 meals/snacks    Improve shortness of breath with ADL's Yes    Intervention Provide education, individualized exercise plan and daily activity instruction to help decrease symptoms of SOB with activities of daily living.    Expected Outcomes Short Term: Improve cardiorespiratory fitness to achieve a reduction of symptoms when performing ADLs;Long Term: Be able to perform more ADLs without symptoms or delay the onset of symptoms    Hypertension Yes    Intervention Provide education on lifestyle modifcations including regular physical activity/exercise, weight management, moderate sodium restriction and increased consumption of fresh fruit, vegetables, and low fat dairy,  alcohol moderation, and smoking cessation.;Monitor prescription use compliance.    Expected Outcomes Short Term: Continued assessment and intervention until BP is < 140/35mm HG in hypertensive participants. < 130/96mm HG in hypertensive participants with diabetes, heart failure or chronic kidney disease.;Long Term: Maintenance of blood pressure at goal levels.           Education:Diabetes - Individual verbal and written instruction to review signs/symptoms of diabetes, desired ranges of glucose level fasting, after meals and with exercise. Acknowledge that pre and post exercise glucose checks will be done for 3 sessions at entry of program.   Education: Know Your Numbers and Risk Factors: -Group verbal and written instruction about important numbers in your health.  Discussion of what are risk factors and how they play a role in the disease process.  Review of Cholesterol, Blood Pressure, Diabetes, and BMI and the role they play in your overall health.   Core Components/Risk Factors/Patient Goals Review:    Core Components/Risk Factors/Patient Goals at Discharge (Final Review):    ITP Comments:  ITP Comments    Row Name 07/31/20 0950 08/02/20 1607         ITP Comments Virtual Visit completed. Patient informed on EP and RD appointment and 6 Minute walk test. Patient also informed of patient health questionnaires on My Chart. Patient Verbalizes understanding. Visit diagnosis can be found in [facility] 07/26/[facility]. Completed 6MWT and gym orientation. Initial ITP created and sent for review to Dr. Emily Filbert, Medical Director.             Comments: Initial TIP

## 2020-08-21 ENCOUNTER — Other Ambulatory Visit: Payer: Self-pay

## 2020-08-21 ENCOUNTER — Encounter: Payer: Medicare PPO | Admitting: *Deleted

## 2020-08-21 DIAGNOSIS — J45909 Unspecified asthma, uncomplicated: Secondary | ICD-10-CM | POA: Diagnosis not present

## 2020-08-21 NOTE — Progress Notes (Signed)
Daily Session Note  Patient Details  Name: Renee Cabrera MRN: [id number] Date of Birth: 1941-09-20 Referring Provider:     Pulmonary Rehab from 08/02/2020 in [facility] Cardiac and Pulmonary Rehab  Referring Provider Ottie Glazier MD      Encounter Date: 08/21/2020  Check In:  Session Check In - 08/21/20 0952      Check-In   Supervising physician immediately available to respond to emergencies See telemetry face sheet for immediately available ER MD    Location [facility]-Cardiac & Pulmonary Rehab    Staff Present Hope Budds RDN, LDN;Osaze Hubbert Sherryll Burger, RN Margurite Auerbach, MS Exercise Physiologist    Virtual Visit No    Medication changes reported     No    Fall or balance concerns reported    No    Warm-up and Cool-down Performed on first and last piece of equipment    Resistance Training Performed Yes    VAD Patient? No    PAD/SET Patient? No      Pain Assessment   Currently in Pain? No/denies              Social History   Tobacco Use  Smoking Status Never Smoker  Smokeless Tobacco Never Used    Goals Met:  Independence with exercise equipment Exercise tolerated well No report of cardiac concerns or symptoms Strength training completed today  Goals Unmet:  Not Applicable  Comments: First full day of exercise!  Patient was oriented to gym and equipment including functions, settings, policies, and procedures.  Patient's individual exercise prescription and treatment plan were reviewed.  All starting workloads were established based on the results of the 6 minute walk test done at initial orientation visit.  The plan for exercise progression was also introduced and progression will be customized based on patient's performance and goals.     Dr. Emily Filbert is Medical Director for [place] and [facility].

## 2020-08-23 ENCOUNTER — Other Ambulatory Visit: Payer: Self-pay

## 2020-08-23 DIAGNOSIS — J45909 Unspecified asthma, uncomplicated: Secondary | ICD-10-CM

## 2020-08-23 NOTE — Progress Notes (Signed)
Daily Session Note  Patient Details  Name: Renee Cabrera MRN: [id number] Date of Birth: [birth year] Referring Provider:     Pulmonary Rehab from 08/02/2020 in [facility] Cardiac and Pulmonary Rehab  Referring Provider Ottie Glazier MD      Encounter Date: 08/23/2020  Check In:  Session Check In - 08/23/20 1012      Check-In   Supervising physician immediately available to respond to emergencies See telemetry face sheet for immediately available ER MD    Location [facility]-Cardiac & Pulmonary Rehab    Staff Present Birdie Sons, MPA, RN;Melissa Caiola RDN, Rowe Pavy, BA, ACSM CEP, Exercise Physiologist;Kara Eliezer Bottom, MS Exercise Physiologist    Virtual Visit No    Medication changes reported     No    Fall or balance concerns reported    No    Warm-up and Cool-down Performed on first and last piece of equipment    Resistance Training Performed Yes    VAD Patient? No    PAD/SET Patient? No      Pain Assessment   Currently in Pain? No/denies              Social History   Tobacco Use  Smoking Status Never Smoker  Smokeless Tobacco Never Used    Goals Met:  Independence with exercise equipment Exercise tolerated well No report of cardiac concerns or symptoms Strength training completed today  Goals Unmet:  Not Applicable  Comments: Pt able to follow exercise prescription today without complaint.  Will continue to monitor for progression.    Dr. Emily Filbert is Medical Director for [facility] and [facility].

## 2020-08-27 DIAGNOSIS — E538 Deficiency of other specified B group vitamins: Secondary | ICD-10-CM | POA: Diagnosis not present

## 2020-08-28 ENCOUNTER — Encounter: Payer: Medicare PPO | Attending: Pulmonary Disease | Admitting: *Deleted

## 2020-08-28 ENCOUNTER — Other Ambulatory Visit: Payer: Self-pay

## 2020-08-28 DIAGNOSIS — J45909 Unspecified asthma, uncomplicated: Secondary | ICD-10-CM | POA: Diagnosis not present

## 2020-08-28 NOTE — Progress Notes (Signed)
Daily Session Note  Patient Details  Name: Renee Cabrera MRN: [id number] Date of Birth: [birth year] Referring Provider:     Pulmonary Rehab from 08/02/2020 in [facility] Cardiac and Pulmonary Rehab  Referring Provider Ottie Glazier MD      Encounter Date: 08/28/2020  Check In:  Session Check In - 08/28/20 1054      Check-In   Supervising physician immediately available to respond to emergencies See telemetry face sheet for immediately available ER MD    Location [facility]-Cardiac & Pulmonary Rehab    Staff Present Heath Lark, RN, BSN, CCRP;Joseph Hood RCP,RRT,BSRT;Amanda Oletta Darter, [place], ACSM CEP, Exercise Physiologist    Virtual Visit No    Medication changes reported     No    Fall or balance concerns reported    No    Warm-up and Cool-down Performed on first and last piece of equipment    Resistance Training Performed Yes    VAD Patient? No    PAD/SET Patient? No      Pain Assessment   Currently in Pain? No/denies              Social History   Tobacco Use  Smoking Status Never Smoker  Smokeless Tobacco Never Used    Goals Met:  Proper associated with RPD/PD & O2 Sat Exercise tolerated well No report of cardiac concerns or symptoms  Goals Unmet:  Not Applicable  Comments: Pt able to follow exercise prescription today without complaint.  Will continue to monitor for progression.    Dr. Emily Filbert is Medical Director for [place] and [facility].

## 2020-08-29 ENCOUNTER — Encounter: Payer: Self-pay | Admitting: *Deleted

## 2020-08-29 DIAGNOSIS — J45909 Unspecified asthma, uncomplicated: Secondary | ICD-10-CM

## 2020-08-29 NOTE — Progress Notes (Signed)
Pulmonary Individual Treatment Plan  Patient Details  Name: Renee Cabrera MRN: [id number] Date of Birth: 11-06-1940 Referring Provider:     Pulmonary Rehab from 08/02/2020 in [facility] Cardiac [facility] Pulmonary Rehab  Referring Provider Ottie Glazier MD      Initial Encounter Date:    Pulmonary Rehab from 08/02/2020 in [facility] Cardiac and Pulmonary Rehab  Date 08/02/20      Visit Diagnosis: Uncomplicated asthma, unspecified asthma severity, unspecified whether persistent  Patient's Home Medications on Admission:  Current Outpatient Medications:  .  albuterol (VENTOLIN HFA) 108 (90 Base) MCG/ACT inhaler, Inhale into the lungs., Disp: , Rfl:  .  ascorbic Acid (VITAMIN C) 500 MG CPCR, Take by mouth., Disp: , Rfl:  .  aspirin EC 81 MG tablet, Take 81 mg by mouth daily., Disp: , Rfl:  .  BELSOMRA 10 MG TABS, Take 10 mg by mouth at bedtime. , Disp: , Rfl: 5 .  Calcium Carb-Cholecalciferol (CALTRATE 600+D3 PO), Take 1 tablet by mouth 2 (two) times daily., Disp: , Rfl:  .  Calcium Carbonate-Vitamin D 600-400 MG-UNIT tablet, Take by mouth., Disp: , Rfl:  .  cholecalciferol (VITAMIN D) 1000 UNITS tablet, Take 1,000 Units by mouth every evening. , Disp: , Rfl:  .  citalopram (CELEXA) 20 MG tablet, Take 20 mg by mouth daily at 8 pm. (1900), Disp: , Rfl:  .  citalopram (CELEXA) 20 MG tablet, Take 1 tablet by mouth daily., Disp: , Rfl:  .  clobetasol (TEMOVATE) 0.05 % external solution, MIX IN A TUB OF CERAVE CREAM AND USE EVERYDAY UP TO TWICE DAILY ON ITCHY RASH UNTIL CLEAR, AVOID FACE, GROIN, AXILLAA., Disp: 50 mL, Rfl: 0 .  cyanocobalamin (,VITAMIN B-12,) 1000 MCG/ML injection, Inject 1,000 mcg into the muscle every 30 (thirty) days. , Disp: , Rfl:  .  denosumab (PROLIA) 60 MG/ML SOSY injection, Inject 60 mg into the skin every 6 (six) months. , Disp: , Rfl:  .  Fe Fum-FA-B Cmp-C-Zn-Mg-Mn-Cu (HEMATINIC PLUS VIT/MINERALS) 106-1 MG TABS, Take 1 tablet by mouth daily with lunch. , Disp: , Rfl: 3 .  Fe  Fum-FA-B Cmp-C-Zn-Mg-Mn-Cu (HEMATINIC PLUS VIT/MINERALS) 106-1 MG TABS, Take 1 tablet by mouth daily., Disp: , Rfl:  .  gabapentin (NEURONTIN) 100 MG capsule, Take 100-200 mg by mouth See admin instructions. Take 1 capsule (100 mg) by mouth in the morning & take 2 capsules (200 mg) by mouth at night., Disp: , Rfl:  .  HYDROcodone-acetaminophen (NORCO/VICODIN) 5-325 MG tablet, Take 1 tablet by mouth every 4 (four) hours as needed (pain.)., Disp: , Rfl:  .  megestrol (MEGACE) 40 MG/ML suspension, Take 800 mg by mouth daily., Disp: , Rfl:  .  megestrol (MEGACE) 40 MG/ML suspension, Take by mouth., Disp: , Rfl:  .  meloxicam (MOBIC) 7.5 MG tablet, Take 7.5 mg by mouth daily with supper. , Disp: , Rfl:  .  meloxicam (MOBIC) 7.5 MG tablet, Take 1 tablet by mouth daily., Disp: , Rfl:  .  mirtazapine (REMERON) 30 MG tablet, Take 30 mg by mouth at bedtime., Disp: , Rfl:  .  mirtazapine (REMERON) 45 MG tablet, Take 1 tablet by mouth at bedtime., Disp: , Rfl:  .  mirtazapine (REMERON) 45 MG tablet, , Disp: , Rfl:  .  Multiple Vitamins-Minerals (PRESERVISION AREDS) TABS, Take 1 tablet by mouth 2 (two) times daily. , Disp: , Rfl:  .  pantoprazole (PROTONIX) 40 MG tablet, Take 40 mg by mouth daily before supper. , Disp: , Rfl: 1 .  pantoprazole (PROTONIX) 40 MG tablet, Take 1 tablet by mouth daily., Disp: , Rfl:  .  Suvorexant (BELSOMRA) 10 MG TABS, Take 1 tablet by mouth daily., Disp: , Rfl:  .  Travoprost, BAK Free, (TRAVATAN) 0.004 % SOLN ophthalmic solution, SMARTSIG:1 Drop(s) In Eye(s) Every Evening, Disp: , Rfl:  .  travoprost, benzalkonium, (TRAVATAN) 0.004 % ophthalmic solution, Place 1 drop into both eyes at bedtime., Disp: , Rfl:  .  vitamin C (ASCORBIC ACID) 500 MG tablet, Take 500 mg by mouth 2 (two) times daily., Disp: , Rfl:   Past Medical History: Past Medical History:  Diagnosis Date  . Anemia    IRON INFUSIONS  . Arthritis    osteoarthritis/ RA  . Asthma    remote h/o asthma  .  Depression    controlled  . H/O hypoglycemia   . H/O: GI bleed   . Headache(784.0)    h/o migraines  . Hearing loss, bilateral    wears hearing aides  . History of blood transfusion    pt states she had yellow jaundice  . Hypertension    H/O NO TX NOW  . Lupus (systemic lupus erythematosus) ([place]) 1995   in remission since 2005  . Nasal sinus congestion   . Osteoporosis with fracture   . Stress fracture of foot    left foot  . Vitamin B 12 deficiency     Tobacco Use: Social History   Tobacco Use  Smoking Status Never Smoker  Smokeless Tobacco Never Used    Labs: Recent Review Flowsheet Data   There is no flowsheet data to display.      Pulmonary Assessment Scores:  Pulmonary Assessment Scores    Row Name 08/02/20 1609         ADL UCSD   ADL Phase Entry     SOB Score total 17     Rest 0     Walk 1     Stairs 3     Bath 0     Dress 0     Shop 0       CAT Score   CAT Score 8       mMRC Score   mMRC Score 1            UCSD: Self-administered rating of dyspnea associated with activities of daily living (ADLs) 6-point scale (0 = "not at all" to 5 = "maximal or unable to do because of breathlessness")  Scoring Scores range from 0 to 120.  Minimally important difference is 5 units  CAT: CAT can identify the health impairment of COPD patients and is better correlated with disease progression.  CAT has a scoring range of zero to 40. The CAT score is classified into four groups of low (less than 10), medium (10 - 20), high (21-30) and very high (31-40) based on the impact level of disease on health status. A CAT score over 10 suggests significant symptoms.  A worsening CAT score could be explained by an exacerbation, poor medication adherence, poor inhaler technique, or progression of COPD or comorbid conditions.  CAT MCID is 2 points  mMRC: mMRC (Modified Medical Research Council) Dyspnea Scale is used to assess the degree of baseline functional disability  in patients of respiratory disease due to dyspnea. No minimal important difference is established. A decrease in score of 1 point or greater is considered a positive change.   Pulmonary Function Assessment:  Pulmonary Function Assessment - 07/31/20 0940  Breath   Shortness of Breath Yes;Limiting activity           Exercise Target Goals: Exercise Program Goal: Individual exercise prescription set using results from initial 6 min walk test and THRR while considering  patient's activity barriers and safety.   Exercise Prescription Goal: Initial exercise prescription builds to 30-45 minutes a day of aerobic activity, 2-3 days per week.  Home exercise guidelines will be given to patient during program as part of exercise prescription that the participant will acknowledge.  Education: Aerobic Exercise & Resistance Training: - Gives group verbal and written instruction on the various components of exercise. Focuses on aerobic and resistive training programs and the benefits of this training and how to safely progress through these programs..   Pulmonary Rehab from 08/23/2020 in [facility] Cardiac and Pulmonary Rehab  Date 08/23/20  Hilda Blades only on 10/28]  Educator [facility]  Instruction Review Code 1- [employer] Understanding      Education: Exercise & Equipment Safety: - Individual verbal instruction and demonstration of equipment use and safety with use of the equipment.   Pulmonary Rehab from 08/23/2020 in [facility] Cardiac and Pulmonary Rehab  Date 07/31/20  Educator [facility]  Instruction Review Code 1- Verbalizes Understanding      Education: Exercise Physiology & General Exercise Guidelines: - Group verbal and written instruction with models to review the exercise physiology of the cardiovascular system and associated critical values. Provides general exercise guidelines with specific guidelines to those with heart or lung disease.    Education: Flexibility, Balance, Mind/Body  Relaxation: Provides group verbal/written instruction on the benefits of flexibility and balance training, including mind/body exercise modes such as yoga, pilates and tai chi.  Demonstration and skill practice provided.   Activity Barriers & Risk Stratification:  Activity Barriers & Cardiac Risk Stratification - 08/02/20 1620      Activity Barriers & Cardiac Risk Stratification   Activity Barriers Other (comment)    Comments 'Rod" placed in right leg           6 Minute Walk:  6 Minute Walk    Row Name 08/02/20 1612         6 Minute Walk   Phase Initial     Distance 1195 feet     Walk Time 6 minutes     # of Rest Breaks 9     MPH 2.26     METS 2.69     RPE 9     Perceived Dyspnea  0     VO2 Peak 9.44     Symptoms No     Resting HR 85 bpm     Resting BP 132/78     Resting Oxygen Saturation  98 %     Exercise Oxygen Saturation  during 6 min walk 93 %     Max Ex. HR 100 bpm     Max Ex. BP 158/82     2 Minute Post BP 136/80       Interval HR   1 Minute HR 94     2 Minute HR 95     3 Minute HR 99     4 Minute HR 96     5 Minute HR 97     6 Minute HR 100     2 Minute Post HR 82     Interval Heart Rate? Yes       Interval Oxygen   Interval Oxygen? Yes     Baseline Oxygen Saturation % 98 %  1 Minute Oxygen Saturation % 96 %     1 Minute Liters of Oxygen 0 L  RA     2 Minute Oxygen Saturation % 96 %     2 Minute Liters of Oxygen 0 L     3 Minute Oxygen Saturation % 93 %     3 Minute Liters of Oxygen 0 L     4 Minute Oxygen Saturation % 94 %     4 Minute Liters of Oxygen 0 L     5 Minute Oxygen Saturation % 96 %     5 Minute Liters of Oxygen 0 L     6 Minute Oxygen Saturation % 93 %     6 Minute Liters of Oxygen 0 L     2 Minute Post Oxygen Saturation % 97 %     2 Minute Post Liters of Oxygen 0 L           Oxygen Initial Assessment:  Oxygen Initial Assessment - 08/02/20 1609      Home Oxygen   Home Oxygen Device None    Sleep Oxygen Prescription  None    Home Exercise Oxygen Prescription None    Home Resting Oxygen Prescription None      Initial 6 min Walk   Oxygen Used None      Program Oxygen Prescription   Program Oxygen Prescription None      Intervention   Short Term Goals To learn and understand importance of monitoring SPO2 with pulse oximeter and demonstrate accurate use of the pulse oximeter.;To learn and understand importance of maintaining oxygen saturations>88%;To learn and demonstrate proper pursed lip breathing techniques or other breathing techniques.;To learn and demonstrate proper use of respiratory medications    Long  Term Goals Verbalizes importance of monitoring SPO2 with pulse oximeter and return demonstration;Maintenance of O2 saturations>88%;Exhibits proper breathing techniques, such as pursed lip breathing or other method taught during program session;Compliance with respiratory medication;Demonstrates proper use of MDI's           Oxygen Re-Evaluation:  Oxygen Re-Evaluation    Row Name 08/21/20 0955             Program Oxygen Prescription   Program Oxygen Prescription None         Home Oxygen   Home Oxygen Device None       Sleep Oxygen Prescription None       Home Exercise Oxygen Prescription None       Home Resting Oxygen Prescription None         Goals/Expected Outcomes   Short Term Goals To learn and understand importance of monitoring SPO2 with pulse oximeter and demonstrate accurate use of the pulse oximeter.;To learn and understand importance of maintaining oxygen saturations>88%;To learn and demonstrate proper pursed lip breathing techniques or other breathing techniques.;To learn and demonstrate proper use of respiratory medications       Long  Term Goals Verbalizes importance of monitoring SPO2 with pulse oximeter and return demonstration;Maintenance of O2 saturations>88%;Exhibits proper breathing techniques, such as pursed lip breathing or other method taught during program  session;Compliance with respiratory medication;Demonstrates proper use of MDI's       Comments Reviewed PLB technique with pt.  Talked about how it works and it's importance in maintaining their exercise saturations.       Goals/Expected Outcomes Short: Become more profiecient at using PLB.   Long: Become independent at using PLB.  Oxygen Discharge (Final Oxygen Re-Evaluation):  Oxygen Re-Evaluation - 08/21/20 0955      Program Oxygen Prescription   Program Oxygen Prescription None      Home Oxygen   Home Oxygen Device None    Sleep Oxygen Prescription None    Home Exercise Oxygen Prescription None    Home Resting Oxygen Prescription None      Goals/Expected Outcomes   Short Term Goals To learn and understand importance of monitoring SPO2 with pulse oximeter and demonstrate accurate use of the pulse oximeter.;To learn and understand importance of maintaining oxygen saturations>88%;To learn and demonstrate proper pursed lip breathing techniques or other breathing techniques.;To learn and demonstrate proper use of respiratory medications    Long  Term Goals Verbalizes importance of monitoring SPO2 with pulse oximeter and return demonstration;Maintenance of O2 saturations>88%;Exhibits proper breathing techniques, such as pursed lip breathing or other method taught during program session;Compliance with respiratory medication;Demonstrates proper use of MDI's    Comments Reviewed PLB technique with pt.  Talked about how it works and it's importance in maintaining their exercise saturations.    Goals/Expected Outcomes Short: Become more profiecient at using PLB.   Long: Become independent at using PLB.           Initial Exercise Prescription:  Initial Exercise Prescription - 08/02/20 1600      Date of Initial Exercise RX and Referring Provider   Date 08/02/20    Referring Provider Ottie Glazier MD      Treadmill   MPH 2.3    Grade 0.5    Minutes 15    METs 2.92       Recumbant Bike   Level 2    RPM 60    Watts 10    Minutes 15    METs 2.69      NuStep   Level 3    SPM 80    Minutes 15    METs 2.69      Prescription Details   Frequency (times per week) 2    Duration Progress to 30 minutes of continuous aerobic without signs/symptoms of physical distress      Intensity   THRR 40-80% of Max Heartrate 107-129    Ratings of Perceived Exertion 11-13    Perceived Dyspnea 0-4      Resistance Training   Training Prescription Yes           Perform Capillary Blood Glucose checks as needed.  Exercise Prescription Changes:  Exercise Prescription Changes    Row Name 08/02/20 1600 08/23/20 1100           Response to Exercise   Blood Pressure (Admit) 132/78 142/88      Blood Pressure (Exercise) 158/82 148/64      Blood Pressure (Exit) 130/78 110/60      Heart Rate (Admit) 85 bpm 83 bpm      Heart Rate (Exercise) 100 bpm 100 bpm      Heart Rate (Exit) 82 bpm 80 bpm      Oxygen Saturation (Admit) 98 % 97 %      Oxygen Saturation (Exercise) 93 % 95 %      Oxygen Saturation (Exit) 98 % 99 %      Rating of Perceived Exertion (Exercise) 9 11      Perceived Dyspnea (Exercise) 0 1      Symptoms none --      Comments walk test results first day      Duration Progress to 30 minutes  of  aerobic without signs/symptoms of physical distress --        Resistance Training   Training Prescription -- Yes      Weight 3 lb 1 lb      Reps 10-15 10-15        Treadmill   MPH -- 0.7      Grade -- 0      Minutes -- 15      METs -- 1.54        NuStep   Level -- 3      SPM -- 80      Minutes -- 15      METs -- 1.2             Exercise Comments:   Exercise Goals and Review:  Exercise Goals    Row Name 08/02/20 1630             Exercise Goals   Increase Physical Activity Yes       Intervention Provide advice, education, support and counseling about physical activity/exercise needs.;Develop an individualized exercise prescription for  aerobic and resistive training based on initial evaluation findings, risk stratification, comorbidities and participant's personal goals.       Expected Outcomes Short Term: Attend rehab on a regular basis to increase amount of physical activity.;Long Term: Add in home exercise to make exercise part of routine and to increase amount of physical activity.;Long Term: Exercising regularly at least 3-5 days a week.       Increase Strength and Stamina Yes       Intervention Provide advice, education, support and counseling about physical activity/exercise needs.;Develop an individualized exercise prescription for aerobic and resistive training based on initial evaluation findings, risk stratification, comorbidities and participant's personal goals.       Expected Outcomes Short Term: Increase workloads from initial exercise prescription for resistance, speed, and METs.;Short Term: Perform resistance training exercises routinely during rehab and add in resistance training at home;Long Term: Improve cardiorespiratory fitness, muscular endurance and strength as measured by increased METs and functional capacity (6MWT)       Able to understand and use rate of perceived exertion (RPE) scale Yes       Intervention Provide education and explanation on how to use RPE scale       Expected Outcomes Short Term: Able to use RPE daily in rehab to express subjective intensity level;Long Term:  Able to use RPE to guide intensity level when exercising independently       Able to understand and use Dyspnea scale Yes       Intervention Provide education and explanation on how to use Dyspnea scale       Expected Outcomes Short Term: Able to use Dyspnea scale daily in rehab to express subjective sense of shortness of breath during exertion;Long Term: Able to use Dyspnea scale to guide intensity level when exercising independently       Knowledge and understanding of Target Heart Rate Range (THRR) Yes       Intervention Provide  education and explanation of THRR including how the numbers were predicted and where they are located for reference       Expected Outcomes Short Term: Able to state/look up THRR;Short Term: Able to use daily as guideline for intensity in rehab;Long Term: Able to use THRR to govern intensity when exercising independently       Able to check pulse independently Yes       Intervention Provide education and  demonstration on how to check pulse in carotid and radial arteries.;Review the importance of being able to check your own pulse for safety during independent exercise       Expected Outcomes Short Term: Able to explain why pulse checking is important during independent exercise;Long Term: Able to check pulse independently and accurately       Understanding of Exercise Prescription Yes       Intervention Provide education, explanation, and written materials on patient's individual exercise prescription       Expected Outcomes Short Term: Able to explain program exercise prescription;Long Term: Able to explain home exercise prescription to exercise independently              Exercise Goals Re-Evaluation :  Exercise Goals Re-Evaluation    Row Name 08/21/20 0955             Exercise Goal Re-Evaluation   Exercise Goals Review Increase Physical Activity;Able to understand and use rate of perceived exertion (RPE) scale;Knowledge and understanding of Target Heart Rate Range (THRR);Understanding of Exercise Prescription;Increase Strength and Stamina;Able to check pulse independently;Able to understand and use Dyspnea scale       Comments Reviewed RPE and dyspnea scales, THR and program prescription with pt today.  Pt voiced understanding and was given a copy of goals to take home.       Expected Outcomes Short: Use RPE daily to regulate intensity. Long: Follow program prescription in THR.              Discharge Exercise Prescription (Final Exercise Prescription Changes):  Exercise Prescription  Changes - 08/23/20 1100      Response to Exercise   Blood Pressure (Admit) 142/88    Blood Pressure (Exercise) 148/64    Blood Pressure (Exit) 110/60    Heart Rate (Admit) 83 bpm    Heart Rate (Exercise) 100 bpm    Heart Rate (Exit) 80 bpm    Oxygen Saturation (Admit) 97 %    Oxygen Saturation (Exercise) 95 %    Oxygen Saturation (Exit) 99 %    Rating of Perceived Exertion (Exercise) 11    Perceived Dyspnea (Exercise) 1    Comments first day      Resistance Training   Training Prescription Yes    Weight 1 lb    Reps 10-15      Treadmill   MPH 0.7    Grade 0    Minutes 15    METs 1.54      NuStep   Level 3    SPM 80    Minutes 15    METs 1.2           Nutrition:  Target Goals: Understanding of nutrition guidelines, daily intake of sodium <1536m, cholesterol <2026m calories 30% from fat and 7% or less from saturated fats, daily to have 5 or more servings of fruits and vegetables.  Education: Controlling Sodium/Reading Food Labels -Group verbal and written material supporting the discussion of sodium use in heart healthy nutrition. Review and explanation with models, verbal and written materials for utilization of the food label.   Education: General Nutrition Guidelines/Fats and Fiber: -Group instruction provided by verbal, written material, models and posters to present the general guidelines for heart healthy nutrition. Gives an explanation and review of dietary fats and fiber.   Biometrics:    Nutrition Therapy Plan and Nutrition Goals:   Nutrition Assessments:  Nutrition Assessments - 08/02/20 1609      MEDFICTS Scores   Pre  Score 66           MEDIFICTS Score Key:          ?70 Need to make dietary changes          40-70 Heart Healthy Diet         ? 40 Therapeutic Level Cholesterol Diet  Nutrition Goals Re-Evaluation:   Nutrition Goals Discharge (Final Nutrition Goals Re-Evaluation):   Psychosocial: Target Goals: Acknowledge presence or  absence of significant depression and/or stress, maximize coping skills, provide positive support system. Participant is able to verbalize types and ability to use techniques and skills needed for reducing stress and depression.   Education: Depression - Provides group verbal and written instruction on the correlation between heart/lung disease and depressed mood, treatment options, and the stigmas associated with seeking treatment.   Education: Sleep Hygiene -Provides group verbal and written instruction about how sleep can affect your health.  Define sleep hygiene, discuss sleep cycles and impact of sleep habits. Review good sleep hygiene tips.    Education: Stress and Anxiety: - Provides group verbal and written instruction about the health risks of elevated stress and causes of high stress.  Discuss the correlation between heart/lung disease and anxiety and treatment options. Review healthy ways to manage with stress and anxiety.   Initial Review & Psychosocial Screening:  Initial Psych Review & Screening - 07/31/20 0943      Initial Review   Current issues with Current Psychotropic Meds;Current Stress Concerns    Comments Her back bothers her and is wanting to do more exercise to help with mood.      Family Dynamics   Good Support System? Yes    Comments She can look to her cousin and friend for support.      Barriers   Psychosocial barriers to participate in program The patient should benefit from training in stress management and relaxation.      Screening Interventions   Interventions Encouraged to exercise;To provide support and resources with identified psychosocial needs;Provide feedback about the scores to participant    Expected Outcomes Short Term goal: Utilizing psychosocial counselor, staff and physician to assist with identification of specific Stressors or current issues interfering with healing process. Setting desired goal for each stressor or current issue  identified.;Long Term Goal: Stressors or current issues are controlled or eliminated.;Short Term goal: Identification and review with participant of any Quality of Life or Depression concerns found by scoring the questionnaire.;Long Term goal: The participant improves quality of Life and PHQ9 Scores as seen by post scores and/or verbalization of changes           Quality of Life Scores:  Scores of 19 and below usually indicate a poorer quality of life in these areas.  A difference of  2-3 points is a clinically meaningful difference.  A difference of 2-3 points in the total score of the Quality of Life Index has been associated with significant improvement in overall quality of life, self-image, physical symptoms, and general health in studies assessing change in quality of life.  PHQ-9: Recent Review Flowsheet Data    Depression screen [facility] 2/9 08/02/2020   Decreased Interest 0   Down, Depressed, Hopeless 0   PHQ - 2 Score 0   Altered sleeping 0   Tired, decreased energy 1   Change in appetite 1   Feeling bad or failure about yourself  0   Trouble concentrating 0   Moving slowly or fidgety/restless 0   Suicidal thoughts  0   PHQ-9 Score 2   Difficult doing work/chores Not difficult at all     Interpretation of Total Score  Total Score Depression Severity:  1-4 = Minimal depression, 5-9 = Mild depression, 10-14 = Moderate depression, 15-19 = Moderately severe depression, 20-27 = Severe depression   Psychosocial Evaluation and Intervention:  Psychosocial Evaluation - 07/31/20 0945      Psychosocial Evaluation & Interventions   Interventions Encouraged to exercise with the program and follow exercise prescription;Relaxation education;Stress management education    Comments She can look to her cousin and friend for support. She states that with everything that is going on with COVID is stressful.    Expected Outcomes Short: Exercise regularly to support mental health and notify staff  of any changes. Long: maintain mental health and well being through teaching of rehab or prescribed medications independently.    Continue Psychosocial Services  Follow up required by staff           Psychosocial Re-Evaluation:   Psychosocial Discharge (Final Psychosocial Re-Evaluation):   Education: Education Goals: Education classes will be provided on a weekly basis, covering required topics. Participant will state understanding/return demonstration of topics presented.  Learning Barriers/Preferences:  Learning Barriers/Preferences - 07/31/20 0941      Learning Barriers/Preferences   Learning Barriers Hearing    Learning Preferences None           General Pulmonary Education Topics:  Infection Prevention: - Provides verbal and written material to individual with discussion of infection control including proper hand washing and proper equipment cleaning during exercise session.   Pulmonary Rehab from 08/23/2020 in [facility] Cardiac and Pulmonary Rehab  Date 07/31/20  Educator [facility]  Instruction Review Code 1- Verbalizes Understanding      Falls Prevention: - Provides verbal and written material to individual with discussion of falls prevention and safety.   Pulmonary Rehab from 08/23/2020 in [facility] Cardiac and Pulmonary Rehab  Date 07/31/20  Educator [facility]  Instruction Review Code 1- Verbalizes Understanding      Chronic Lung Diseases: - Group verbal and written instruction to review updates, respiratory medications, advancements in procedures and treatments. Discuss use of supplemental oxygen including available portable oxygen systems, continuous and intermittent flow rates, concentrators, personal use and safety guidelines. Review proper use of inhaler and spacers. Provide informative websites for self-education.    Energy Conservation: - Provide group verbal and written instruction for methods to conserve energy, plan and organize activities. Instruct on pacing  techniques, use of adaptive equipment and posture/positioning to relieve shortness of breath.   Triggers and Exacerbations: - Group verbal and written instruction to review types of environmental triggers and ways to prevent exacerbations. Discuss weather changes, air quality and the benefits of nasal washing. Review warning signs and symptoms to help prevent infections. Discuss techniques for effective airway clearance, coughing, and vibrations.   AED/CPR: - Group verbal and written instruction with the use of models to demonstrate the basic use of the AED with the basic ABC's of resuscitation.   Anatomy and Physiology of the Lungs: - Group verbal and written instruction with the use of models to provide basic lung anatomy and physiology related to function, structure and complications of lung disease.   Anatomy & Physiology of the Heart: - Group verbal and written instruction and models provide basic cardiac anatomy and physiology, with the coronary electrical and arterial systems. Review of Valvular disease and Heart Failure   Pulmonary Rehab from 08/23/2020 in [facility] Cardiac and Pulmonary Rehab  Date 08/23/20  Educator [facility]  Instruction Review Code 1- Verbalizes Understanding      Cardiac Medications: - Group verbal and written instruction to review commonly prescribed medications for heart disease. Reviews the medication, class of the drug, and side effects.   Other: -Provides group and verbal instruction on various topics (see comments)   Knowledge Questionnaire Score:  Knowledge Questionnaire Score - 08/02/20 1611      Knowledge Questionnaire Score   Pre Score 14/18: Oxygen, ADLs,            Core Components/Risk Factors/Patient Goals at Admission:  Personal Goals and Risk Factors at Admission - 08/02/20 1631      Core Components/Risk Factors/Patient Goals on Admission    Weight Management Yes;Weight Maintenance    Intervention Weight Management: Provide education and  appropriate resources to help participant work on and attain dietary goals.;Weight Management/Obesity: Establish reasonable short term and long term weight goals.;Weight Management: Develop a combined nutrition and exercise program designed to reach desired caloric intake, while maintaining appropriate intake of nutrient and fiber, sodium and fats, and appropriate energy expenditure required for the weight goal.    Admit Weight 92 lb 11.2 oz (42 kg)    Goal Weight: Short Term 92 lb 11.2 oz (42 kg)    Goal Weight: Long Term 92 lb 11.2 oz (42 kg)    Expected Outcomes Short Term: Continue to assess and modify interventions until short term weight is achieved;Long Term: Adherence to nutrition and physical activity/exercise program aimed toward attainment of established weight goal;Weight Maintenance: Understanding of the daily nutrition guidelines, which includes 25-35% calories from fat, 7% or less cal from saturated fats, less than 228m cholesterol, less than 1.5gm of sodium, & 5 or more servings of fruits and vegetables daily;Understanding recommendations for meals to include 15-35% energy as protein, 25-35% energy from fat, 35-60% energy from carbohydrates, less than 2078mof dietary cholesterol, 20-35 gm of total fiber daily;Understanding of distribution of calorie intake throughout the day with the consumption of 4-5 meals/snacks    Improve shortness of breath with ADL's Yes    Intervention Provide education, individualized exercise plan and daily activity instruction to help decrease symptoms of SOB with activities of daily living.    Expected Outcomes Short Term: Improve cardiorespiratory fitness to achieve a reduction of symptoms when performing ADLs;Long Term: Be able to perform more ADLs without symptoms or delay the onset of symptoms    Hypertension Yes    Intervention Provide education on lifestyle modifcations including regular physical activity/exercise, weight management, moderate sodium  restriction and increased consumption of fresh fruit, vegetables, and low fat dairy, alcohol moderation, and smoking cessation.;Monitor prescription use compliance.    Expected Outcomes Short Term: Continued assessment and intervention until BP is < 140/9053mG in hypertensive participants. < 130/48m72m in hypertensive participants with diabetes, heart failure or chronic kidney disease.;Long Term: Maintenance of blood pressure at goal levels.           Education:Diabetes - Individual verbal and written instruction to review signs/symptoms of diabetes, desired ranges of glucose level fasting, after meals and with exercise. Acknowledge that pre and post exercise glucose checks will be done for 3 sessions at entry of program.   Education: Know Your Numbers and Risk Factors: -Group verbal and written instruction about important numbers in your health.  Discussion of what are risk factors and how they play a role in the disease process.  Review of Cholesterol, Blood Pressure, Diabetes, and BMI and the role  they play in your overall health.   Core Components/Risk Factors/Patient Goals Review:    Core Components/Risk Factors/Patient Goals at Discharge (Final Review):    ITP Comments:  ITP Comments    Row Name 07/31/20 0950 08/02/20 1607 08/21/20 0954 08/29/20 0657     ITP Comments Virtual Visit completed. Patient informed on EP and RD appointment and 6 Minute walk test. Patient also informed of patient health questionnaires on My Chart. Patient Verbalizes understanding. Visit diagnosis can be found in [facility] 07/26/2020. Completed 6MWT and gym orientation. Initial ITP created and sent for review to Dr. Emily Filbert, Medical Director. First full day of exercise!  Patient was oriented to gym and equipment including functions, settings, policies, and procedures.  Patient's individual exercise prescription and treatment plan were reviewed.  All starting workloads were established based on the results of the  6 minute walk test done at initial orientation visit.  The plan for exercise progression was also introduced and progression will be customized based on patient's performance and goals. 30 Day review completed. Medical Director ITP review done, changes made as directed, and signed approval by Medical Director.           Comments:

## 2020-08-30 ENCOUNTER — Other Ambulatory Visit: Payer: Self-pay

## 2020-08-30 ENCOUNTER — Encounter: Payer: Medicare PPO | Admitting: *Deleted

## 2020-08-30 DIAGNOSIS — J45909 Unspecified asthma, uncomplicated: Secondary | ICD-10-CM | POA: Diagnosis not present

## 2020-08-30 NOTE — Progress Notes (Signed)
Daily Session Note  Patient Details  Name: Renee Cabrera MRN: [id number] Date of Birth: 06/16/1941 Referring Provider:     Pulmonary Rehab from 08/02/2020 in [facility] Cardiac and Pulmonary Rehab  Referring Provider Ottie Glazier MD      Encounter Date: 08/30/2020  Check In:  Session Check In - 08/30/20 1044      Check-In   Supervising physician immediately available to respond to emergencies See telemetry face sheet for immediately available ER MD    Location [facility]-Cardiac & Pulmonary Rehab    Staff Present Hope Budds RDN, LDN;Nakyiah Kuck Sherryll Burger, RN BSN;Jessica [place], MA, RCEP, CCRP, CCET;Amanda Sommer, BA, ACSM CEP, Exercise Physiologist    Virtual Visit No    Medication changes reported     No    Fall or balance concerns reported    No    Warm-up and Cool-down Performed on first and last piece of equipment    Resistance Training Performed Yes    VAD Patient? No    PAD/SET Patient? No      Pain Assessment   Currently in Pain? No/denies              Social History   Tobacco Use  Smoking Status Never Smoker  Smokeless Tobacco Never Used    Goals Met:  Independence with exercise equipment Exercise tolerated well No report of cardiac concerns or symptoms Strength training completed today  Goals Unmet:  Not Applicable  Comments: Pt able to follow exercise prescription today without complaint.  Will continue to monitor for progression.    Dr. Emily Filbert is Medical Director for [place] and [facility].

## 2020-09-04 ENCOUNTER — Other Ambulatory Visit: Payer: Self-pay

## 2020-09-04 ENCOUNTER — Encounter: Payer: Medicare PPO | Admitting: *Deleted

## 2020-09-04 DIAGNOSIS — J45909 Unspecified asthma, uncomplicated: Secondary | ICD-10-CM | POA: Diagnosis not present

## 2020-09-04 NOTE — Progress Notes (Signed)
Daily Session Note  Patient Details  Name: Renee Cabrera MRN: [id number] Date of Birth: 1941/08/23 Referring Provider:     Pulmonary Rehab from 08/02/2020 in [facility] Cardiac and Pulmonary Rehab  Referring Provider Ottie Glazier MD      Encounter Date: 09/04/2020  Check In:  Session Check In - 09/04/20 1005      Check-In   Supervising physician immediately available to respond to emergencies See telemetry face sheet for immediately available ER MD    Location [facility]-Cardiac & Pulmonary Rehab    Staff Present Heath Lark, RN, BSN, CCRP;Amanda Sommer, BA, ACSM CEP, Exercise Physiologist;Kara Eliezer Bottom, MS Exercise Physiologist    Virtual Visit No    Medication changes reported     No    Fall or balance concerns reported    No    Warm-up and Cool-down Performed on first and last piece of equipment    Resistance Training Performed Yes    VAD Patient? No    PAD/SET Patient? No      Pain Assessment   Currently in Pain? No/denies              Social History   Tobacco Use  Smoking Status Never Smoker  Smokeless Tobacco Never Used    Goals Met:  Proper associated with RPD/PD & O2 Sat Independence with exercise equipment Exercise tolerated well No report of cardiac concerns or symptoms  Goals Unmet:  Not Applicable  Comments: Pt able to follow exercise prescription today without complaint.  Will continue to monitor for progression.    Dr. Emily Filbert is Medical Director for [place] and [facility].

## 2020-09-06 ENCOUNTER — Other Ambulatory Visit: Payer: Self-pay

## 2020-09-06 DIAGNOSIS — J45909 Unspecified asthma, uncomplicated: Secondary | ICD-10-CM

## 2020-09-06 NOTE — Progress Notes (Signed)
Daily Session Note  Patient Details  Name: Renee Cabrera MRN: [id number] Date of Birth: 1941-07-05 Referring Provider:     Pulmonary Rehab from 08/02/2020 in [facility] Cardiac and Pulmonary Rehab  Referring Provider Ottie Glazier MD      Encounter Date: 09/06/2020  Check In:  Session Check In - 09/06/20 1000      Check-In   Supervising physician immediately available to respond to emergencies See telemetry face sheet for immediately available ER MD    Location [facility]-Cardiac & Pulmonary Rehab    Staff Present Birdie Sons, MPA, Elveria Rising, BA, ACSM CEP, Exercise Physiologist;Kara Eliezer Bottom, MS Exercise Physiologist    Virtual Visit No    Medication changes reported     No    Fall or balance concerns reported    No    Warm-up and Cool-down Performed on first and last piece of equipment    Resistance Training Performed Yes    VAD Patient? No    PAD/SET Patient? No      Pain Assessment   Currently in Pain? No/denies              Social History   Tobacco Use  Smoking Status Never Smoker  Smokeless Tobacco Never Used    Goals Met:  Independence with exercise equipment Exercise tolerated well No report of cardiac concerns or symptoms Strength training completed today  Goals Unmet:  Not Applicable  Comments: Pt able to follow exercise prescription today without complaint.  Will continue to monitor for progression.    Dr. Emily Filbert is Medical Director for [place] [facility].

## 2020-09-07 DIAGNOSIS — M48062 Spinal stenosis, lumbar region with neurogenic claudication: Secondary | ICD-10-CM | POA: Diagnosis not present

## 2020-09-07 DIAGNOSIS — M5136 Other intervertebral disc degeneration, lumbar region: Secondary | ICD-10-CM | POA: Diagnosis not present

## 2020-09-07 DIAGNOSIS — M62830 Muscle spasm of back: Secondary | ICD-10-CM | POA: Diagnosis not present

## 2020-09-07 DIAGNOSIS — Z79899 Other long term (current) drug therapy: Secondary | ICD-10-CM | POA: Diagnosis not present

## 2020-09-07 DIAGNOSIS — M461 Sacroiliitis, not elsewhere classified: Secondary | ICD-10-CM | POA: Diagnosis not present

## 2020-09-07 DIAGNOSIS — M5416 Radiculopathy, lumbar region: Secondary | ICD-10-CM | POA: Diagnosis not present

## 2020-09-11 ENCOUNTER — Encounter: Payer: Medicare PPO | Admitting: *Deleted

## 2020-09-11 ENCOUNTER — Other Ambulatory Visit: Payer: Self-pay

## 2020-09-11 DIAGNOSIS — J45909 Unspecified asthma, uncomplicated: Secondary | ICD-10-CM | POA: Diagnosis not present

## 2020-09-11 NOTE — Progress Notes (Signed)
Daily Session Note  Patient Details  Name: Renee Cabrera MRN: [id number] Date of Birth: 03-05-1941 Referring Provider:     Pulmonary Rehab from 08/02/2020 in [facility] Cardiac and Pulmonary Rehab  Referring Provider Ottie Glazier MD      Encounter Date: 09/11/2020  Check In:  Session Check In - 09/11/20 1013      Check-In   Supervising physician immediately available to respond to emergencies See telemetry face sheet for immediately available ER MD    Location [facility]-Cardiac & Pulmonary Rehab    Staff Present Heath Lark, RN, BSN, CCRP;Amanda Sommer, BA, ACSM CEP, Exercise Physiologist;Kara Eliezer Bottom, MS Exercise Physiologist    Virtual Visit No    Medication changes reported     No    Fall or balance concerns reported    No    Warm-up and Cool-down Performed on first and last piece of equipment    Resistance Training Performed Yes    VAD Patient? No    PAD/SET Patient? No      Pain Assessment   Currently in Pain? No/denies              Social History   Tobacco Use  Smoking Status Never Smoker  Smokeless Tobacco Never Used    Goals Met:  Proper associated with RPD/PD & O2 Sat Independence with exercise equipment Exercise tolerated well No report of cardiac concerns or symptoms  Goals Unmet:  Not Applicable  Comments: Pt able to follow exercise prescription today without complaint.  Will continue to monitor for progression.    Dr. Emily Filbert is Medical Director for [place] and [facility].

## 2020-09-18 ENCOUNTER — Encounter: Payer: Medicare PPO | Admitting: *Deleted

## 2020-09-18 ENCOUNTER — Other Ambulatory Visit: Payer: Self-pay

## 2020-09-18 DIAGNOSIS — J45909 Unspecified asthma, uncomplicated: Secondary | ICD-10-CM

## 2020-09-18 NOTE — Progress Notes (Signed)
Daily Session Note  Patient Details  Name: Renee Cabrera MRN: [id number] Date of Birth: 1941-02-10 Referring Provider:     Pulmonary Rehab from 08/02/2020 in [facility] Cardiac and Pulmonary Rehab  Referring Provider Ottie Glazier MD      Encounter Date: 09/18/2020  Check In:  Session Check In - 09/18/20 1118      Check-In   Supervising physician immediately available to respond to emergencies See telemetry face sheet for immediately available ER MD    Location [facility]-Cardiac & Pulmonary Rehab    Staff Present Alberteen Sam, MA, RCEP, CCRP, Marylynn Pearson, MS Exercise Physiologist;Amanda Oletta Darter, [place], ACSM CEP, Exercise Physiologist    Virtual Visit No    Medication changes reported     No    Fall or balance concerns reported    No    Warm-up and Cool-down Performed on first and last piece of equipment    Resistance Training Performed Yes    VAD Patient? No    PAD/SET Patient? No      Pain Assessment   Currently in Pain? No/denies              Social History   Tobacco Use  Smoking Status Never Smoker  Smokeless Tobacco Never Used    Goals Met:  Proper associated with RPD/PD & O2 Sat Independence with exercise equipment Exercise tolerated well No report of cardiac concerns or symptoms  Goals Unmet:  Not Applicable  Comments: Pt able to follow exercise prescription today without complaint.  Will continue to monitor for progression.    Dr. Emily Filbert is Medical Director for [place] and [facility].

## 2020-09-26 ENCOUNTER — Encounter: Payer: Self-pay | Admitting: *Deleted

## 2020-09-26 DIAGNOSIS — J45909 Unspecified asthma, uncomplicated: Secondary | ICD-10-CM

## 2020-09-26 NOTE — Progress Notes (Signed)
Pulmonary Individual Treatment Plan  Patient Details  Name: Renee Cabrera MRN: [id number] Date of Birth: April 17, 1941 Referring Provider:     Pulmonary Rehab from 08/02/2020 in [facility] Cardiac and Pulmonary Rehab  Referring Provider Ottie Glazier MD      Initial Encounter Date:    Pulmonary Rehab from 08/02/2020 in [facility] Cardiac and Pulmonary Rehab  Date 08/02/20      Visit Diagnosis: Uncomplicated asthma, unspecified asthma severity, unspecified whether persistent  Patient's Home Medications on Admission:  Current Outpatient Medications:    albuterol (VENTOLIN HFA) 108 (90 Base) MCG/ACT inhaler, Inhale into the lungs., Disp: , Rfl:    ascorbic Acid (VITAMIN C) 500 MG CPCR, Take by mouth., Disp: , Rfl:    aspirin EC 81 MG tablet, Take 81 mg by mouth daily., Disp: , Rfl:    BELSOMRA 10 MG TABS, Take 10 mg by mouth at bedtime. , Disp: , Rfl: 5   Calcium Carb-Cholecalciferol (CALTRATE 600+D3 PO), Take 1 tablet by mouth 2 (two) times daily., Disp: , Rfl:    Calcium Carbonate-Vitamin D 600-400 MG-UNIT tablet, Take by mouth., Disp: , Rfl:    cholecalciferol (VITAMIN D) 1000 UNITS tablet, Take 1,000 Units by mouth every evening. , Disp: , Rfl:    citalopram (CELEXA) 20 MG tablet, Take 20 mg by mouth daily at 8 pm. (1900), Disp: , Rfl:    citalopram (CELEXA) 20 MG tablet, Take 1 tablet by mouth daily., Disp: , Rfl:    clobetasol (TEMOVATE) 0.05 % external solution, MIX IN A TUB OF CERAVE CREAM AND USE EVERYDAY UP TO TWICE DAILY ON ITCHY RASH UNTIL CLEAR, AVOID FACE, GROIN, AXILLAA., Disp: 50 mL, Rfl: 0   cyanocobalamin (,VITAMIN B-12,) 1000 MCG/ML injection, Inject 1,000 mcg into the muscle every 30 (thirty) days. , Disp: , Rfl:    denosumab (PROLIA) 60 MG/ML SOSY injection, Inject 60 mg into the skin every 6 (six) months. , Disp: , Rfl:    Fe Fum-FA-B Cmp-C-Zn-Mg-Mn-Cu (HEMATINIC PLUS VIT/MINERALS) 106-1 MG TABS, Take 1 tablet by mouth daily with lunch. , Disp: , Rfl: 3   Fe  Fum-FA-B Cmp-C-Zn-Mg-Mn-Cu (HEMATINIC PLUS VIT/MINERALS) 106-1 MG TABS, Take 1 tablet by mouth daily., Disp: , Rfl:    gabapentin (NEURONTIN) 100 MG capsule, Take 100-200 mg by mouth See admin instructions. Take 1 capsule (100 mg) by mouth in the morning & take 2 capsules (200 mg) by mouth at night., Disp: , Rfl:    HYDROcodone-acetaminophen (NORCO/VICODIN) 5-325 MG tablet, Take 1 tablet by mouth every 4 (four) hours as needed (pain.)., Disp: , Rfl:    megestrol (MEGACE) 40 MG/ML suspension, Take 800 mg by mouth daily., Disp: , Rfl:    megestrol (MEGACE) 40 MG/ML suspension, Take by mouth., Disp: , Rfl:    meloxicam (MOBIC) 7.5 MG tablet, Take 7.5 mg by mouth daily with supper. , Disp: , Rfl:    meloxicam (MOBIC) 7.5 MG tablet, Take 1 tablet by mouth daily., Disp: , Rfl:    mirtazapine (REMERON) 30 MG tablet, Take 30 mg by mouth at bedtime., Disp: , Rfl:    mirtazapine (REMERON) 45 MG tablet, Take 1 tablet by mouth at bedtime., Disp: , Rfl:    mirtazapine (REMERON) 45 MG tablet, , Disp: , Rfl:    Multiple Vitamins-Minerals (PRESERVISION AREDS) TABS, Take 1 tablet by mouth 2 (two) times daily. , Disp: , Rfl:    pantoprazole (PROTONIX) 40 MG tablet, Take 40 mg by mouth daily before supper. , Disp: , Rfl: 1  pantoprazole (PROTONIX) 40 MG tablet, Take 1 tablet by mouth daily., Disp: , Rfl:    Suvorexant (BELSOMRA) 10 MG TABS, Take 1 tablet by mouth daily., Disp: , Rfl:    Travoprost, BAK Free, (TRAVATAN) 0.004 % SOLN ophthalmic solution, SMARTSIG:1 Drop(s) In Eye(s) Every Evening, Disp: , Rfl:    travoprost, benzalkonium, (TRAVATAN) 0.004 % ophthalmic solution, Place 1 drop into both eyes at bedtime., Disp: , Rfl:    vitamin C (ASCORBIC ACID) 500 MG tablet, Take 500 mg by mouth 2 (two) times daily., Disp: , Rfl:   Past Medical History: Past Medical History:  Diagnosis Date   Anemia    IRON INFUSIONS   Arthritis    osteoarthritis/ RA   Asthma    remote h/o asthma    Depression    controlled   H/O hypoglycemia    H/O: GI bleed    Headache(784.0)    h/o migraines   Hearing loss, bilateral    wears hearing aides   History of blood transfusion    pt states she had yellow jaundice   Hypertension    H/O NO TX NOW   Lupus (systemic lupus erythematosus) ([place]) 1995   in remission since 2005   Nasal sinus congestion    Osteoporosis with fracture    Stress fracture of foot    left foot   Vitamin B 12 deficiency     Tobacco Use: Social History   Tobacco Use  Smoking Status Never Smoker  Smokeless Tobacco Never Used    Labs: Recent Review Flowsheet Data   There is no flowsheet data to display.      Pulmonary Assessment Scores:  Pulmonary Assessment Scores    Row Name 08/02/20 1609         ADL UCSD   ADL Phase Entry     SOB Score total 17     Rest 0     Walk 1     Stairs 3     Bath 0     Dress 0     Shop 0       CAT Score   CAT Score 8       mMRC Score   mMRC Score 1            UCSD: Self-administered rating of dyspnea associated with activities of daily living (ADLs) 6-point scale (0 = "not at all" to 5 = "maximal or unable to do because of breathlessness")  Scoring Scores range from 0 to 120.  Minimally important difference is 5 units  CAT: CAT can identify the health impairment of COPD patients and is better correlated with disease progression.  CAT has a scoring range of zero to 40. The CAT score is classified into four groups of low (less than 10), medium (10 - 20), high (21-30) and very high (31-40) based on the impact level of disease on health status. A CAT score over 10 suggests significant symptoms.  A worsening CAT score could be explained by an exacerbation, poor medication adherence, poor inhaler technique, or progression of COPD or comorbid conditions.  CAT MCID is 2 points  mMRC: mMRC (Modified Medical Research Council) Dyspnea Scale is used to assess the degree of baseline functional disability  in patients of respiratory disease due to dyspnea. No minimal important difference is established. A decrease in score of 1 point or greater is considered a positive change.   Pulmonary Function Assessment:  Pulmonary Function Assessment - 07/31/20 0940  Breath   Shortness of Breath Yes;Limiting activity           Exercise Target Goals: Exercise Program Goal: Individual exercise prescription set using results from initial 6 min walk test and THRR while considering  patients activity barriers and safety.   Exercise Prescription Goal: Initial exercise prescription builds to 30-45 minutes a day of aerobic activity, 2-3 days per week.  Home exercise guidelines will be given to patient during program as part of exercise prescription that the participant will acknowledge.  Education: Aerobic Exercise: - Group verbal and visual presentation on the components of exercise prescription. Introduces F.I.T.T principle from ACSM for exercise prescriptions.  Reviews F.I.T.T. principles of aerobic exercise including progression. Written material given at graduation.   Pulmonary Rehab from 09/18/2020 in [facility] Cardiac and Pulmonary Rehab  Date 08/23/20  Hilda Blades only on 10/28]  Educator [facility]  Instruction Review Code 1- [employer] Understanding      Education: Resistance Exercise: - Group verbal and visual presentation on the components of exercise prescription. Introduces F.I.T.T principle from ACSM for exercise prescriptions  Reviews F.I.T.T. principles of resistance exercise including progression. Written material given at graduation.    Education: Exercise & Equipment Safety: - Individual verbal instruction and demonstration of equipment use and safety with use of the equipment.   Pulmonary Rehab from 09/18/2020 in [facility] Cardiac and Pulmonary Rehab  Date 07/31/20  Educator [facility]  Instruction Review Code 1- Verbalizes Understanding      Education: Exercise Physiology & General Exercise  Guidelines: - Group verbal and written instruction with models to review the exercise physiology of the cardiovascular system and associated critical values. Provides general exercise guidelines with specific guidelines to those with heart or lung disease.    Education: Flexibility, Balance, Mind/Body Relaxation: - Group verbal and visual presentation with interactive activity on the components of exercise prescription. Introduces F.I.T.T principle from ACSM for exercise prescriptions. Reviews F.I.T.T. principles of flexibility and balance exercise training including progression. Also discusses the mind body connection.  Reviews various relaxation techniques to help reduce and manage stress (i.e. Deep breathing, progressive muscle relaxation, and visualization). Balance handout provided to take home. Written material given at graduation.   Pulmonary Rehab from 09/18/2020 in [facility] Cardiac and Pulmonary Rehab  Date 08/30/20  Educator [facility]  Instruction Review Code 1- Verbalizes Understanding      Activity Barriers & Risk Stratification:  Activity Barriers & Cardiac Risk Stratification - 08/02/20 1620      Activity Barriers & Cardiac Risk Stratification   Activity Barriers Other (comment)    Comments 'Rod" placed in right leg           6 Minute Walk:  6 Minute Walk    Row Name 08/02/20 1612         6 Minute Walk   Phase Initial     Distance 1195 feet     Walk Time 6 minutes     # of Rest Breaks 9     MPH 2.26     METS 2.69     RPE 9     Perceived Dyspnea  0     VO2 Peak 9.44     Symptoms No     Resting HR 85 bpm     Resting BP 132/78     Resting Oxygen Saturation  98 %     Exercise Oxygen Saturation  during 6 min walk 93 %     Max Ex. HR 100 bpm     Max Ex.  BP 158/82     2 Minute Post BP 136/80       Interval HR   1 Minute HR 94     2 Minute HR 95     3 Minute HR 99     4 Minute HR 96     5 Minute HR 97     6 Minute HR 100     2 Minute Post HR 82     Interval Heart  Rate? Yes       Interval Oxygen   Interval Oxygen? Yes     Baseline Oxygen Saturation % 98 %     1 Minute Oxygen Saturation % 96 %     1 Minute Liters of Oxygen 0 L  RA     2 Minute Oxygen Saturation % 96 %     2 Minute Liters of Oxygen 0 L     3 Minute Oxygen Saturation % 93 %     3 Minute Liters of Oxygen 0 L     4 Minute Oxygen Saturation % 94 %     4 Minute Liters of Oxygen 0 L     5 Minute Oxygen Saturation % 96 %     5 Minute Liters of Oxygen 0 L     6 Minute Oxygen Saturation % 93 %     6 Minute Liters of Oxygen 0 L     2 Minute Post Oxygen Saturation % 97 %     2 Minute Post Liters of Oxygen 0 L           Oxygen Initial Assessment:  Oxygen Initial Assessment - 08/02/20 1609      Home Oxygen   Home Oxygen Device None    Sleep Oxygen Prescription None    Home Exercise Oxygen Prescription None    Home Resting Oxygen Prescription None      Initial 6 min Walk   Oxygen Used None      Program Oxygen Prescription   Program Oxygen Prescription None      Intervention   Short Term Goals To learn and understand importance of monitoring SPO2 with pulse oximeter and demonstrate accurate use of the pulse oximeter.;To learn and understand importance of maintaining oxygen saturations>88%;To learn and demonstrate proper pursed lip breathing techniques or other breathing techniques.;To learn and demonstrate proper use of respiratory medications    Long  Term Goals Verbalizes importance of monitoring SPO2 with pulse oximeter and return demonstration;Maintenance of O2 saturations>88%;Exhibits proper breathing techniques, such as pursed lip breathing or other method taught during program session;Compliance with respiratory medication;Demonstrates proper use of MDIs           Oxygen Re-Evaluation:  Oxygen Re-Evaluation    Row Name 08/21/20 0955 09/11/20 0958           Program Oxygen Prescription   Program Oxygen Prescription None None        Home Oxygen   Home Oxygen  Device None None      Sleep Oxygen Prescription None None      Home Exercise Oxygen Prescription None None      Home Resting Oxygen Prescription None None        Goals/Expected Outcomes   Short Term Goals To learn and understand importance of monitoring SPO2 with pulse oximeter and demonstrate accurate use of the pulse oximeter.;To learn and understand importance of maintaining oxygen saturations>88%;To learn and demonstrate proper pursed lip breathing techniques or other breathing techniques.;To learn and demonstrate proper use  of respiratory medications To learn and understand importance of monitoring SPO2 with pulse oximeter and demonstrate accurate use of the pulse oximeter.;To learn and understand importance of maintaining oxygen saturations>88%;To learn and demonstrate proper pursed lip breathing techniques or other breathing techniques.;To learn and demonstrate proper use of respiratory medications      Long  Term Goals Verbalizes importance of monitoring SPO2 with pulse oximeter and return demonstration;Maintenance of O2 saturations>88%;Exhibits proper breathing techniques, such as pursed lip breathing or other method taught during program session;Compliance with respiratory medication;Demonstrates proper use of MDIs Verbalizes importance of monitoring SPO2 with pulse oximeter and return demonstration;Maintenance of O2 saturations>88%;Exhibits proper breathing techniques, such as pursed lip breathing or other method taught during program session;Compliance with respiratory medication;Demonstrates proper use of MDIs      Comments Reviewed PLB technique with pt.  Talked about how it works and it's importance in maintaining their exercise saturations. She is on alberterol and uses the lung exercise equipment. She reports using PLB and feels that is more natural. She hasn't had to use her inhaler and the cold weather has not been affecting her breathing.      Goals/Expected Outcomes Short: Become  more profiecient at using PLB.   Long: Become independent at using PLB. Short: continue with lung exercises and practicing PLB.  Long: Become independent at using PLB.             Oxygen Discharge (Final Oxygen Re-Evaluation):  Oxygen Re-Evaluation - 09/11/20 0958      Program Oxygen Prescription   Program Oxygen Prescription None      Home Oxygen   Home Oxygen Device None    Sleep Oxygen Prescription None    Home Exercise Oxygen Prescription None    Home Resting Oxygen Prescription None      Goals/Expected Outcomes   Short Term Goals To learn and understand importance of monitoring SPO2 with pulse oximeter and demonstrate accurate use of the pulse oximeter.;To learn and understand importance of maintaining oxygen saturations>88%;To learn and demonstrate proper pursed lip breathing techniques or other breathing techniques.;To learn and demonstrate proper use of respiratory medications    Long  Term Goals Verbalizes importance of monitoring SPO2 with pulse oximeter and return demonstration;Maintenance of O2 saturations>88%;Exhibits proper breathing techniques, such as pursed lip breathing or other method taught during program session;Compliance with respiratory medication;Demonstrates proper use of MDIs    Comments She is on alberterol and uses the lung exercise equipment. She reports using PLB and feels that is more natural. She hasn't had to use her inhaler and the cold weather has not been affecting her breathing.    Goals/Expected Outcomes Short: continue with lung exercises and practicing PLB.  Long: Become independent at using PLB.           Initial Exercise Prescription:  Initial Exercise Prescription - 08/02/20 1600      Date of Initial Exercise RX and Referring Provider   Date 08/02/20    Referring Provider Ottie Glazier MD      Treadmill   MPH 2.3    Grade 0.5    Minutes 15    METs 2.92      Recumbant Bike   Level 2    RPM 60    Watts 10    Minutes 15     METs 2.69      NuStep   Level 3    SPM 80    Minutes 15    METs 2.69      Prescription  Details   Frequency (times per week) 2    Duration Progress to 30 minutes of continuous aerobic without signs/symptoms of physical distress      Intensity   THRR 40-80% of Max Heartrate 107-129    Ratings of Perceived Exertion 11-13    Perceived Dyspnea 0-4      Resistance Training   Training Prescription Yes           Perform Capillary Blood Glucose checks as needed.  Exercise Prescription Changes:  Exercise Prescription Changes    Row Name 08/02/20 1600 08/23/20 1100 09/06/20 0700 09/19/20 1200       Response to Exercise   Blood Pressure (Admit) 132/78 142/88 130/76 114/68    Blood Pressure (Exercise) 158/82 148/64 124/66 140/76    Blood Pressure (Exit) 130/78 110/60 104/68 106/68    Heart Rate (Admit) 85 bpm 83 bpm 85 bpm 80 bpm    Heart Rate (Exercise) 100 bpm 100 bpm 97 bpm 107 bpm    Heart Rate (Exit) 82 bpm 80 bpm 85 bpm 95 bpm    Oxygen Saturation (Admit) 98 % 97 % 96 % 98 %    Oxygen Saturation (Exercise) 93 % 95 % 98 % 96 %    Oxygen Saturation (Exit) 98 % 99 % 98 % 98 %    Rating of Perceived Exertion (Exercise) 9 11 11 11     Perceived Dyspnea (Exercise) 0 1 0 0    Symptoms none -- -- --    Comments walk test results first day none none    Duration Progress to 30 minutes of  aerobic without signs/symptoms of physical distress -- Progress to 30 minutes of  aerobic without signs/symptoms of physical distress Continue with 30 min of aerobic exercise without signs/symptoms of physical distress.    Intensity -- -- THRR unchanged THRR unchanged      Progression   Progression -- -- Continue to progress workloads to maintain intensity without signs/symptoms of physical distress. Continue to progress workloads to maintain intensity without signs/symptoms of physical distress.    Average METs -- -- 2.2 2      Resistance Training   Training Prescription -- Yes Yes Yes    Weight  3 lb 1 lb 1 lb 1 lb    Reps 10-15 10-15 10-15 10-15      Interval Training   Interval Training -- -- No No      Treadmill   MPH -- 0.7 1.2 1.2    Grade -- 0 0.5 0.5    Minutes -- 15 15 15     METs -- 1.54 2 2      Recumbant Bike   Level -- -- 2 --    Minutes -- -- 15 --      NuStep   Level -- 3 3 3     SPM -- 80 -- 80    Minutes -- 15 15 15     METs -- 1.2 2.4 1.9           Exercise Comments:   Exercise Goals and Review:  Exercise Goals    Row Name 08/02/20 1630             Exercise Goals   Increase Physical Activity Yes       Intervention Provide advice, education, support and counseling about physical activity/exercise needs.;Develop an individualized exercise prescription for aerobic and resistive training based on initial evaluation findings, risk stratification, comorbidities and participant's personal goals.       Expected  Outcomes Short Term: Attend rehab on a regular basis to increase amount of physical activity.;Long Term: Add in home exercise to make exercise part of routine and to increase amount of physical activity.;Long Term: Exercising regularly at least 3-5 days a week.       Increase Strength and Stamina Yes       Intervention Provide advice, education, support and counseling about physical activity/exercise needs.;Develop an individualized exercise prescription for aerobic and resistive training based on initial evaluation findings, risk stratification, comorbidities and participant's personal goals.       Expected Outcomes Short Term: Increase workloads from initial exercise prescription for resistance, speed, and METs.;Short Term: Perform resistance training exercises routinely during rehab and add in resistance training at home;Long Term: Improve cardiorespiratory fitness, muscular endurance and strength as measured by increased METs and functional capacity (6MWT)       Able to understand and use rate of perceived exertion (RPE) scale Yes        Intervention Provide education and explanation on how to use RPE scale       Expected Outcomes Short Term: Able to use RPE daily in rehab to express subjective intensity level;Long Term:  Able to use RPE to guide intensity level when exercising independently       Able to understand and use Dyspnea scale Yes       Intervention Provide education and explanation on how to use Dyspnea scale       Expected Outcomes Short Term: Able to use Dyspnea scale daily in rehab to express subjective sense of shortness of breath during exertion;Long Term: Able to use Dyspnea scale to guide intensity level when exercising independently       Knowledge and understanding of Target Heart Rate Range (THRR) Yes       Intervention Provide education and explanation of THRR including how the numbers were predicted and where they are located for reference       Expected Outcomes Short Term: Able to state/look up THRR;Short Term: Able to use daily as guideline for intensity in rehab;Long Term: Able to use THRR to govern intensity when exercising independently       Able to check pulse independently Yes       Intervention Provide education and demonstration on how to check pulse in carotid and radial arteries.;Review the importance of being able to check your own pulse for safety during independent exercise       Expected Outcomes Short Term: Able to explain why pulse checking is important during independent exercise;Long Term: Able to check pulse independently and accurately       Understanding of Exercise Prescription Yes       Intervention Provide education, explanation, and written materials on patient's individual exercise prescription       Expected Outcomes Short Term: Able to explain program exercise prescription;Long Term: Able to explain home exercise prescription to exercise independently              Exercise Goals Re-Evaluation :  Exercise Goals Re-Evaluation    Row Name 08/21/20 0955 09/06/20 0736 09/11/20  1001 09/19/20 1228       Exercise Goal Re-Evaluation   Exercise Goals Review Increase Physical Activity;Able to understand and use rate of perceived exertion (RPE) scale;Knowledge and understanding of Target Heart Rate Range (THRR);Understanding of Exercise Prescription;Increase Strength and Stamina;Able to check pulse independently;Able to understand and use Dyspnea scale Increase Physical Activity;Increase Strength and Stamina;Understanding of Exercise Prescription Increase Physical Activity;Increase Strength and Stamina;Understanding of  Exercise Prescription Increase Physical Activity;Increase Strength and Stamina;Understanding of Exercise Prescription    Comments Reviewed RPE and dyspnea scales, THR and program prescription with pt today.  Pt voiced understanding and was given a copy of goals to take home. Lauri is off to a good start in rehab.  She is already up to level 3 on the NuStep.  We will continue to monitor her progress. She walks her dog and goes to the park - 20-30 minutes (3x/week). Can't walk in her neighborhood due to large dogs. She does not do any weighted exercises at home. She has not reviewed home exercise yet. Eleanors oxygen levels are in 90s during exercise.  She reoprts RPE of 11.  She reaches THR range sometimes.  Staff will encourage increasing levels to reach THR range and review home exercise.    Expected Outcomes Short: Use RPE daily to regulate intensity. Long: Follow program prescription in THR. Short: Continue to attend class regularly Long: Continue to follow program prescription ST: meet with EP regarding home exercise LT: continue attending pulmonary rehab consistently Short: review home exercise and THR range Long: build overall stamina           Discharge Exercise Prescription (Final Exercise Prescription Changes):  Exercise Prescription Changes - 09/19/20 1200      Response to Exercise   Blood Pressure (Admit) 114/68    Blood Pressure (Exercise) 140/76     Blood Pressure (Exit) 106/68    Heart Rate (Admit) 80 bpm    Heart Rate (Exercise) 107 bpm    Heart Rate (Exit) 95 bpm    Oxygen Saturation (Admit) 98 %    Oxygen Saturation (Exercise) 96 %    Oxygen Saturation (Exit) 98 %    Rating of Perceived Exertion (Exercise) 11    Perceived Dyspnea (Exercise) 0    Comments none    Duration Continue with 30 min of aerobic exercise without signs/symptoms of physical distress.    Intensity THRR unchanged      Progression   Progression Continue to progress workloads to maintain intensity without signs/symptoms of physical distress.    Average METs 2      Resistance Training   Training Prescription Yes    Weight 1 lb    Reps 10-15      Interval Training   Interval Training No      Treadmill   MPH 1.2    Grade 0.5    Minutes 15    METs 2      NuStep   Level 3    SPM 80    Minutes 15    METs 1.9           Nutrition:  Target Goals: Understanding of nutrition guidelines, daily intake of sodium 1500mg , cholesterol 200mg , calories 30% from fat and 7% or less from saturated fats, daily to have 5 or more servings of fruits and vegetables.  Education: All About Nutrition: -Group instruction provided by verbal, written material, interactive activities, discussions, models, and posters to present general guidelines for heart healthy nutrition including fat, fiber, MyPlate, the role of sodium in heart healthy nutrition, utilization of the nutrition label, and utilization of this knowledge for meal planning. Follow up email sent as well. Written material given at graduation.   Pulmonary Rehab from 09/18/2020 in [facility] Cardiac and Pulmonary Rehab  Date 09/06/20  Educator [facility]  Instruction Review Code 1- Verbalizes Understanding      Biometrics:    Nutrition Therapy Plan and Nutrition Goals:  Nutrition Therapy & Goals - 09/11/20 0947      Nutrition Therapy   Diet Pulmonary MNT, high kcal, high protien    Protein (specify units) 57g     Fiber 25 grams    Whole Grain Foods 3 servings    Saturated Fats 12 max. grams    Fruits and Vegetables 5 servings/day    Sodium 1.5 grams      Personal Nutrition Goals   Nutrition Goal ST: add boost 2x/day, add fat and protein to meals (mashed potatoes with butter and cream with beans for a snack, nuts or nut butter and milk to oatmeal for example) LT: gain weight - 1st goal 100 lbs.    Comments Have not completed initial evaluation, however, did do one on one education with patient. She has continuously been losing weight and is at 88 pounds. She also reports needing more iron. Discussed heart healthy eating, high calorie and high protein while still eating heart healthy. She is very interested in power bowls and loves fruit and vegetables. 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 She doesn't normally eat breakfast. She eats whatever she feels like at that time. She loves pasta - she is having altered taste. She reports having small meals. She reports eating a lot of eggs and cheese. 2 meals she will have a good protein source. She adds fats to her meals like bacon, olive oil, butter. She reports loving potatoes. She has about a fistful of food per meal. She reports getting a medication to help with appetite - her appetite has improved some. Discussed adding in boost 2x/day. Discussed heart healthy eating and MNT to gain weight. Small frequent meals, boost, protein at all meals, added fat to meals.      Intervention Plan   Intervention Prescribe, educate and counsel regarding individualized specific dietary modifications aiming towards targeted core components such as weight, hypertension, lipid management, diabetes, heart failure and other comorbidities.;Nutrition handout(s) given to patient.    Expected Outcomes Short Term Goal: Understand basic principles of dietary content, such as calories, fat, sodium, cholesterol and nutrients.;Short Term Goal: A plan has been developed  with personal nutrition goals set during dietitian appointment.;Long Term Goal: Adherence to prescribed nutrition plan.           Nutrition Assessments:  Nutrition Assessments - 08/02/20 1609      MEDFICTS Scores   Pre Score 66          MEDIFICTS Score Key:  ?70 Need to make dietary changes   40-70 Heart Healthy Diet  ? 40 Therapeutic Level Cholesterol Diet   Picture Your Plate Scores:  <80 Unhealthy dietary pattern with much room for improvement.  41-50 Dietary pattern unlikely to meet recommendations for good health and room for improvement.  51-60 More healthful dietary pattern, with some room for improvement.   >60 Healthy dietary pattern, although there may be some specific behaviors that could be improved.   Nutrition Goals Re-Evaluation:   Nutrition Goals Discharge (Final Nutrition Goals Re-Evaluation):   Psychosocial: Target Goals: Acknowledge presence or absence of significant depression and/or stress, maximize coping skills, provide positive support system. Participant is able to verbalize types and ability to use techniques and skills needed for reducing stress and depression.   Education: Stress, Anxiety, and Depression - Group verbal and visual presentation to define topics covered.  Reviews how body is impacted by stress, anxiety, and depression.  Also discusses healthy ways to reduce stress and to treat/manage anxiety  and depression.  Written material given at graduation.   Education: Sleep Hygiene -Provides group verbal and written instruction about how sleep can affect your health.  Define sleep hygiene, discuss sleep cycles and impact of sleep habits. Review good sleep hygiene tips.    Initial Review & Psychosocial Screening:  Initial Psych Review & Screening - 07/31/20 0943      Initial Review   Current issues with Current Psychotropic Meds;Current Stress Concerns    Comments Her back bothers her and is wanting to do more exercise to help  with mood.      Family Dynamics   Good Support System? Yes    Comments She can look to her cousin and friend for support.      Barriers   Psychosocial barriers to participate in program The patient should benefit from training in stress management and relaxation.      Screening Interventions   Interventions Encouraged to exercise;To provide support and resources with identified psychosocial needs;Provide feedback about the scores to participant    Expected Outcomes Short Term goal: Utilizing psychosocial counselor, staff and physician to assist with identification of specific Stressors or current issues interfering with healing process. Setting desired goal for each stressor or current issue identified.;Long Term Goal: Stressors or current issues are controlled or eliminated.;Short Term goal: Identification and review with participant of any Quality of Life or Depression concerns found by scoring the questionnaire.;Long Term goal: The participant improves quality of Life and PHQ9 Scores as seen by post scores and/or verbalization of changes           Quality of Life Scores:  Scores of 19 and below usually indicate a poorer quality of life in these areas.  A difference of  2-3 points is a clinically meaningful difference.  A difference of 2-3 points in the total score of the Quality of Life Index has been associated with significant improvement in overall quality of life, self-image, physical symptoms, and general health in studies assessing change in quality of life.  PHQ-9: Recent Review Flowsheet Data    Depression screen [facility] 2/9 08/02/2020   Decreased Interest 0   Down, Depressed, Hopeless 0   PHQ - 2 Score 0   Altered sleeping 0   Tired, decreased energy 1   Change in appetite 1   Feeling bad or failure about yourself  0   Trouble concentrating 0   Moving slowly or fidgety/restless 0   Suicidal thoughts 0   PHQ-9 Score 2   Difficult doing work/chores Not difficult at all      Interpretation of Total Score  Total Score Depression Severity:  1-4 = Minimal depression, 5-9 = Mild depression, 10-14 = Moderate depression, 15-19 = Moderately severe depression, 20-27 = Severe depression   Psychosocial Evaluation and Intervention:  Psychosocial Evaluation - 07/31/20 0945      Psychosocial Evaluation & Interventions   Interventions Encouraged to exercise with the program and follow exercise prescription;Relaxation education;Stress management education    Comments She can look to her cousin and friend for support. She states that with everything that is going on with COVID is stressful.    Expected Outcomes Short: Exercise regularly to support mental health and notify staff of any changes. Long: maintain mental health and well being through teaching of rehab or prescribed medications independently.    Continue Psychosocial Services  Follow up required by staff           Psychosocial Re-Evaluation:  Psychosocial Re-Evaluation  [place] Name 09/11/20 1004             Psychosocial Re-Evaluation   Current issues with Current Stress Concerns;Current Psychotropic Meds       Comments She has recently had a death in her family and is going to the funeral this Thursday. She has some stress regarding the current state of the world. She is on an anti-anxiety medication after a bad divorce and reports doing well on it. She rpeorts having a good support system - 59 year old friend who she speaks to daily, cousin was a good support system - passed recently - his children are supporting her, church supports her as well. She is on a sleep medication which has been helping - she was never a good sleeper - she sleeps at 11pm and wakes up to her dog at 7am. She reads to help manage stress and croquets.       Expected Outcomes ST: Use her support system during this time of grieving LT: continue to use realxing activities and take medication as directed.       Interventions Encouraged to  attend Pulmonary Rehabilitation for the exercise       Continue Psychosocial Services  Follow up required by staff       Comments She has recently had a death in her family and is going to the funeral this Thursday. She has some stress regarding the current state of the world. She is on an anti-anxiety medication after a bad divorce and reports doing well on it. She rpeorts having a good support system - 3 year old friend who she speaks to daily, cousin was a good support system - passed recently - his children are supporting her, church supports her as well. She is on a sleep medication which has been helping - she was never a good sleeper - she sleeps at 11pm and wakes up to her dog at 7am. She reads to help manage stress and croquets.         Initial Review   Source of Stress Concerns Family              Psychosocial Discharge (Final Psychosocial Re-Evaluation):  Psychosocial Re-Evaluation - 09/11/20 1004      Psychosocial Re-Evaluation   Current issues with Current Stress Concerns;Current Psychotropic Meds    Comments She has recently had a death in her family and is going to the funeral this Thursday. She has some stress regarding the current state of the world. She is on an anti-anxiety medication after a bad divorce and reports doing well on it. She rpeorts having a good support system - 14 year old friend who she speaks to daily, cousin was a good support system - passed recently - his children are supporting her, church supports her as well. She is on a sleep medication which has been helping - she was never a good sleeper - she sleeps at 11pm and wakes up to her dog at 7am. She reads to help manage stress and croquets.    Expected Outcomes ST: Use her support system during this time of grieving LT: continue to use realxing activities and take medication as directed.    Interventions Encouraged to attend Pulmonary Rehabilitation for the exercise    Continue Psychosocial Services  Follow  up required by staff    Comments She has recently had a death in her family and is going to the funeral this Thursday. She has some stress regarding the current  state of the world. She is on an anti-anxiety medication after a bad divorce and reports doing well on it. She rpeorts having a good support system - 39 year old friend who she speaks to daily, cousin was a good support system - passed recently - his children are supporting her, church supports her as well. She is on a sleep medication which has been helping - she was never a good sleeper - she sleeps at 11pm and wakes up to her dog at 7am. She reads to help manage stress and croquets.      Initial Review   Source of Stress Concerns Family           Education: Education Goals: Education classes will be provided on a weekly basis, covering required topics. Participant will state understanding/return demonstration of topics presented.  Learning Barriers/Preferences:  Learning Barriers/Preferences - 07/31/20 0941      Learning Barriers/Preferences   Learning Barriers Hearing    Learning Preferences None           General Pulmonary Education Topics:  Infection Prevention: - Provides verbal and written material to individual with discussion of infection control including proper hand washing and proper equipment cleaning during exercise session.   Pulmonary Rehab from 09/18/2020 in [facility] Cardiac and Pulmonary Rehab  Date 07/31/20  Educator [facility]  Instruction Review Code 1- Verbalizes Understanding      Falls Prevention: - Provides verbal and written material to individual with discussion of falls prevention and safety.   Pulmonary Rehab from 09/18/2020 in [facility] Cardiac and Pulmonary Rehab  Date 07/31/20  Educator [facility]  Instruction Review Code 1- Verbalizes Understanding      Chronic Lung Disease Review: - Group verbal instruction with posters, models, PowerPoint presentations and videos,  to review new updates, new respiratory  medications, new advancements in procedures and treatments. Providing information on websites and "800" numbers for continued self-education. Includes information about supplement oxygen, available portable oxygen systems, continuous and intermittent flow rates, oxygen safety, concentrators, and Medicare reimbursement for oxygen. Explanation of Pulmonary Drugs, including class, frequency, complications, importance of spacers, rinsing mouth after steroid MDI's, and proper cleaning methods for nebulizers. Review of basic lung anatomy and physiology related to function, structure, and complications of lung disease. Review of risk factors. Discussion about methods for diagnosing sleep apnea and types of masks and machines for OSA. Includes a review of the use of types of environmental controls: home humidity, furnaces, filters, dust mite/pet prevention, HEPA vacuums. Discussion about weather changes, air quality and the benefits of nasal washing. Instruction on Warning signs, infection symptoms, calling MD promptly, preventive modes, and value of vaccinations. Review of effective airway clearance, coughing and/or vibration techniques. Emphasizing that all should Create an Action Plan. Written material given at graduation.   AED/CPR: - Group verbal and written instruction with the use of models to demonstrate the basic use of the AED with the basic ABC's of resuscitation.    Anatomy and Cardiac Procedures: - Group verbal and visual presentation and models provide information about basic cardiac anatomy and function. Reviews the testing methods done to diagnose heart disease and the outcomes of the test results. Describes the treatment choices: Medical Management, Angioplasty, or Coronary Bypass Surgery for treating various heart conditions including Myocardial Infarction, Angina, Valve Disease, and Cardiac Arrhythmias.  Written material given at graduation.   Pulmonary Rehab from 09/18/2020 in [facility] Cardiac and  Pulmonary Rehab  Date 08/23/20  Educator [facility]  Instruction Review Code 1- Verbalizes Understanding  Medication Safety: - Group verbal and visual instruction to review commonly prescribed medications for heart and lung disease. Reviews the medication, class of the drug, and side effects. Includes the steps to properly store meds and maintain the prescription regimen.  Written material given at graduation.   Other: -Provides group and verbal instruction on various topics (see comments)   Knowledge Questionnaire Score:  Knowledge Questionnaire Score - 08/02/20 1611      Knowledge Questionnaire Score   Pre Score 14/18: Oxygen, ADLs,            Core Components/Risk Factors/Patient Goals at Admission:  Personal Goals and Risk Factors at Admission - 08/02/20 1631      Core Components/Risk Factors/Patient Goals on Admission    Weight Management Yes;Weight Maintenance    Intervention Weight Management: Provide education and appropriate resources to help participant work on and attain dietary goals.;Weight Management/Obesity: Establish reasonable short term and long term weight goals.;Weight Management: Develop a combined nutrition and exercise program designed to reach desired caloric intake, while maintaining appropriate intake of nutrient and fiber, sodium and fats, and appropriate energy expenditure required for the weight goal.    Admit Weight 92 lb 11.2 oz (42 kg)    Goal Weight: Short Term 92 lb 11.2 oz (42 kg)    Goal Weight: Long Term 92 lb 11.2 oz (42 kg)    Expected Outcomes Short Term: Continue to assess and modify interventions until short term weight is achieved;Long Term: Adherence to nutrition and physical activity/exercise program aimed toward attainment of established weight goal;Weight Maintenance: Understanding of the daily nutrition guidelines, which includes 25-35% calories from fat, 7% or less cal from saturated fats, less than 200mg  cholesterol, less than 1.5gm of  sodium, & 5 or more servings of fruits and vegetables daily;Understanding recommendations for meals to include 15-35% energy as protein, 25-35% energy from fat, 35-60% energy from carbohydrates, less than 200mg  of dietary cholesterol, 20-35 gm of total fiber daily;Understanding of distribution of calorie intake throughout the day with the consumption of 4-5 meals/snacks    Improve shortness of breath with ADL's Yes    Intervention Provide education, individualized exercise plan and daily activity instruction to help decrease symptoms of SOB with activities of daily living.    Expected Outcomes Short Term: Improve cardiorespiratory fitness to achieve a reduction of symptoms when performing ADLs;Long Term: Be able to perform more ADLs without symptoms or delay the onset of symptoms    Hypertension Yes    Intervention Provide education on lifestyle modifcations including regular physical activity/exercise, weight management, moderate sodium restriction and increased consumption of fresh fruit, vegetables, and low fat dairy, alcohol moderation, and smoking cessation.;Monitor prescription use compliance.    Expected Outcomes Short Term: Continued assessment and intervention until BP is < 140/48mm HG in hypertensive participants. < 130/31mm HG in hypertensive participants with diabetes, heart failure or chronic kidney disease.;Long Term: Maintenance of blood pressure at goal levels.           Education:Diabetes - Individual verbal and written instruction to review signs/symptoms of diabetes, desired ranges of glucose level fasting, after meals and with exercise. Acknowledge that pre and post exercise glucose checks will be done for 3 sessions at entry of program.   Know Your Numbers and Heart Failure: - Group verbal and visual instruction to discuss disease risk factors for cardiac and pulmonary disease and treatment options.  Reviews associated critical values for Overweight/Obesity, Hypertension,  Cholesterol, and Diabetes.  Discusses basics of heart failure: signs/symptoms and  treatments.  Introduces Heart Failure Zone chart for action plan for heart failure.  Written material given at graduation.   Pulmonary Rehab from 09/18/2020 in [facility] Cardiac and Pulmonary Rehab  Date 09/18/20  Educator [facility]  Instruction Review Code 1- Verbalizes Understanding      Core Components/Risk Factors/Patient Goals Review:   Goals and Risk Factor Review    Row Name 09/11/20 1011             Core Components/Risk Factors/Patient Goals Review   Personal Goals Review Improve shortness of breath with ADL's;Develop more efficient breathing techniques such as purse lipped breathing and diaphragmatic breathing and practicing self-pacing with activity.       Review She reports her shortness of breath has gotten better since her pnemonia - practices PLB and breathing exercises. She is working on self pacing during class - will talk about home exercise with EP. She is not on any BP medication - she does not have any hypertension at this time. Resting BP today 116/58.       Expected Outcomes ST: Continue to practice breathing techniques LT: talk to EP about home exercise              Core Components/Risk Factors/Patient Goals at Discharge (Final Review):   Goals and Risk Factor Review - 09/11/20 1011      Core Components/Risk Factors/Patient Goals Review   Personal Goals Review Improve shortness of breath with ADL's;Develop more efficient breathing techniques such as purse lipped breathing and diaphragmatic breathing and practicing self-pacing with activity.    Review She reports her shortness of breath has gotten better since her pnemonia - practices PLB and breathing exercises. She is working on self pacing during class - will talk about home exercise with EP. She is not on any BP medication - she does not have any hypertension at this time. Resting BP today 116/58.    Expected Outcomes ST: Continue to  practice breathing techniques LT: talk to EP about home exercise           ITP Comments:  ITP Comments    Row Name 07/31/20 0950 08/02/20 1607 08/21/20 0954 08/29/20 0657 09/26/20 1021   ITP Comments Virtual Visit completed. Patient informed on EP and RD appointment and 6 Minute walk test. Patient also informed of patient health questionnaires on My Chart. Patient Verbalizes understanding. Visit diagnosis can be found in [facility] 07/26/2020. Completed 6MWT and gym orientation. Initial ITP created and sent for review to Dr. Emily Filbert, Medical Director. First full day of exercise!  Patient was oriented to gym and equipment including functions, settings, policies, and procedures.  Patient's individual exercise prescription and treatment plan were reviewed.  All starting workloads were established based on the results of the 6 minute walk test done at initial orientation visit.  The plan for exercise progression was also introduced and progression will be customized based on patient's performance and goals. 30 Day review completed. Medical Director ITP review done, changes made as directed, and signed approval by Medical Director. 30 Day review completed. Medical Director ITP review done, changes made as directed, and signed approval by Medical Director.          Comments:

## 2020-09-27 ENCOUNTER — Other Ambulatory Visit: Payer: Self-pay

## 2020-09-27 ENCOUNTER — Encounter: Payer: Medicare PPO | Attending: Pulmonary Disease

## 2020-09-27 DIAGNOSIS — J45909 Unspecified asthma, uncomplicated: Secondary | ICD-10-CM | POA: Diagnosis not present

## 2020-09-27 NOTE — Progress Notes (Signed)
Daily Session Note  Patient Details  Name: Renee Cabrera MRN: [id number] Date of Birth: Jul 27, 1941 Referring Provider:     Pulmonary Rehab from 08/02/2020 in [facility] Cardiac and Pulmonary Rehab  Referring Provider Ottie Glazier MD      Encounter Date: 09/27/2020  Check In:  Session Check In - 09/27/20 1036      Check-In   Supervising physician immediately available to respond to emergencies See telemetry face sheet for immediately available ER MD    Location [facility]-Cardiac & Pulmonary Rehab    Staff Present Birdie Sons, MPA, Elveria Rising, BA, ACSM CEP, Exercise Physiologist;Melissa Caiola RDN, LDN    Virtual Visit No    Medication changes reported     No    Fall or balance concerns reported    No    Warm-up and Cool-down Performed on first and last piece of equipment    Resistance Training Performed Yes    VAD Patient? No    PAD/SET Patient? No      Pain Assessment   Currently in Pain? No/denies              Social History   Tobacco Use  Smoking Status Never Smoker  Smokeless Tobacco Never Used    Goals Met:  Independence with exercise equipment Exercise tolerated well No report of cardiac concerns or symptoms Strength training completed today  Goals Unmet:  Not Applicable  Comments: Pt able to follow exercise prescription today without complaint.  Will continue to monitor for progression.    Dr. Emily Filbert is Medical Director for [place] [facility].

## 2020-09-28 DIAGNOSIS — E538 Deficiency of other specified B group vitamins: Secondary | ICD-10-CM | POA: Diagnosis not present

## 2020-10-02 ENCOUNTER — Encounter: Payer: Medicare PPO | Admitting: *Deleted

## 2020-10-02 ENCOUNTER — Other Ambulatory Visit: Payer: Self-pay

## 2020-10-02 DIAGNOSIS — J45909 Unspecified asthma, uncomplicated: Secondary | ICD-10-CM | POA: Diagnosis not present

## 2020-10-02 NOTE — Progress Notes (Signed)
Daily Session Note  Patient Details  Name: Renee Cabrera MRN: [id number] Date of Birth: April 03, 1941 Referring Provider:     Pulmonary Rehab from 08/02/2020 in [facility] Cardiac and Pulmonary Rehab  Referring Provider Ottie Glazier MD      Encounter Date: 10/02/2020  Check In:  Session Check In - 10/02/20 1015      Check-In   Supervising physician immediately available to respond to emergencies See telemetry face sheet for immediately available ER MD    Location [facility]-Cardiac & Pulmonary Rehab    Staff Present Nada Maclachlan, BA, ACSM CEP, Exercise Physiologist;Kara Eliezer Bottom, MS Exercise Physiologist;Damaris Geers, RN, BSN, CCRP    Virtual Visit No    Medication changes reported     No    Fall or balance concerns reported    No    Warm-up and Cool-down Performed on first and last piece of equipment    Resistance Training Performed Yes    VAD Patient? No    PAD/SET Patient? No      Pain Assessment   Currently in Pain? No/denies              Social History   Tobacco Use  Smoking Status Never Smoker  Smokeless Tobacco Never Used    Goals Met:  Proper associated with RPD/PD & O2 Sat Independence with exercise equipment Exercise tolerated well No report of cardiac concerns or symptoms  Goals Unmet:  Not Applicable  Comments: Pt able to follow exercise prescription today without complaint.  Will continue to monitor for progression.    Dr. Emily Filbert is Medical Director for [facility] and [facility].

## 2020-10-04 ENCOUNTER — Other Ambulatory Visit: Payer: Self-pay

## 2020-10-04 DIAGNOSIS — J45909 Unspecified asthma, uncomplicated: Secondary | ICD-10-CM

## 2020-10-04 DIAGNOSIS — M810 Age-related osteoporosis without current pathological fracture: Secondary | ICD-10-CM | POA: Diagnosis not present

## 2020-10-04 NOTE — Progress Notes (Signed)
Daily Session Note  Patient Details  Name: Renee Cabrera MRN: [id number] Date of Birth: [birth year] Referring Provider:   Flowsheet Row Pulmonary Rehab from 08/02/2020 in [facility] Cardiac and Pulmonary Rehab  Referring Provider Ottie Glazier MD      Encounter Date: 10/04/2020  Check In:  Session Check In - 10/04/20 0956      Check-In   Supervising physician immediately available to respond to emergencies See telemetry face sheet for immediately available ER MD    Location [facility]-Cardiac & Pulmonary Rehab    Staff Present Birdie Sons, MPA, RN;Melissa Caiola RDN, Rowe Pavy, BA, ACSM CEP, Exercise Physiologist;Jessica Henry, MA, RCEP, CCRP, CCET    Virtual Visit No    Medication changes reported     No    Fall or balance concerns reported    No    Warm-up and Cool-down Performed on first and last piece of equipment    Resistance Training Performed Yes    VAD Patient? No    PAD/SET Patient? No      Pain Assessment   Currently in Pain? No/denies              Social History   Tobacco Use  Smoking Status Never Smoker  Smokeless Tobacco Never Used    Goals Met:  Independence with exercise equipment Exercise tolerated well No report of cardiac concerns or symptoms Strength training completed today  Goals Unmet:  Not Applicable  Comments: Pt able to follow exercise prescription today without complaint.  Will continue to monitor for progression.  Reviewed home exercise with pt today.  Pt plans to walk and use staff videos for exercise.  Reviewed THR, pulse, RPE, sign and symptoms, pulse oximetery and when to call 911 or MD.  Also discussed weather considerations and indoor options.  Pt voiced understanding.  Dr. Emily Filbert is Medical Director for [facility] and [facility].

## 2020-10-05 DIAGNOSIS — M79604 Pain in right leg: Secondary | ICD-10-CM | POA: Diagnosis not present

## 2020-10-05 DIAGNOSIS — Z9889 Other specified postprocedural states: Secondary | ICD-10-CM | POA: Diagnosis not present

## 2020-10-05 DIAGNOSIS — M5431 Sciatica, right side: Secondary | ICD-10-CM | POA: Diagnosis not present

## 2020-10-08 ENCOUNTER — Telehealth: Payer: Self-pay | Admitting: *Deleted

## 2020-10-08 ENCOUNTER — Encounter: Payer: Self-pay | Admitting: *Deleted

## 2020-10-08 DIAGNOSIS — J45909 Unspecified asthma, uncomplicated: Secondary | ICD-10-CM

## 2020-10-08 NOTE — Telephone Encounter (Signed)
Renee Cabrera called to let us know that she saw her doctor for her sciatica pain and he felt that when she tried the 3 lb weights, she may have pulled something and encouraged her to rest a week.  We also encouraged her to stretch and ice (if able) to help as well.  She will keep [place] posted on next week.

## 2020-10-11 DIAGNOSIS — E559 Vitamin D deficiency, unspecified: Secondary | ICD-10-CM | POA: Diagnosis not present

## 2020-10-11 DIAGNOSIS — M810 Age-related osteoporosis without current pathological fracture: Secondary | ICD-10-CM | POA: Diagnosis not present

## 2020-10-16 DIAGNOSIS — M5136 Other intervertebral disc degeneration, lumbar region: Secondary | ICD-10-CM | POA: Diagnosis not present

## 2020-10-16 DIAGNOSIS — M5416 Radiculopathy, lumbar region: Secondary | ICD-10-CM | POA: Diagnosis not present

## 2020-10-16 DIAGNOSIS — M48062 Spinal stenosis, lumbar region with neurogenic claudication: Secondary | ICD-10-CM | POA: Diagnosis not present

## 2020-10-23 ENCOUNTER — Encounter: Payer: Medicare PPO | Admitting: *Deleted

## 2020-10-23 ENCOUNTER — Other Ambulatory Visit: Payer: Self-pay

## 2020-10-23 DIAGNOSIS — J45909 Unspecified asthma, uncomplicated: Secondary | ICD-10-CM | POA: Diagnosis not present

## 2020-10-23 NOTE — Progress Notes (Signed)
Malayla has been struggling with her weight since she had pneumonia in April of 2021 - she reports at this time she was 77lbs (down from 97lbs). At this time she was put on Megace to help with her appetite. She reports that the Megace helped, however, she does still have a smaller appetite. When discussing nutrition we discussed high calorie and protein medical nutrition therapy to help increase weight and regain muscle tissue, especially as she is now exercising. She also began taking boost in addition to her meals 2x/day. When she first came to rehab she was 92.7lbs and is now at 84.8lbs. She has had difficulty eating for the past two weeks due to back pain, but had still been losing weight before this time. Her BMI is now 16.6, underweight. Since she is still having trouble eating enough to maintain weight with exercise, she would benefit from a referral to a dietitian who can follow her more closely and prescribe supplemental feedings if needed. While at rehab we will continue to work on weight gain and meeting nutritional needs.

## 2020-10-23 NOTE — Progress Notes (Signed)
Daily Session Note  Patient Details  Name: Renee VEJAR MRN: [id number] Date of Birth: September 14, 1941 Referring Provider:   Flowsheet Row Pulmonary Rehab from 08/02/2020 in [facility] Cardiac and Pulmonary Rehab  Referring Provider Ottie Glazier MD      Encounter Date: 10/23/2020  Check In:  Session Check In - 10/23/20 1054      Check-In   Supervising physician immediately available to respond to emergencies See telemetry face sheet for immediately available ER MD    Location [facility]-Cardiac & Pulmonary Rehab    Staff Present Heath Lark, RN, BSN, CCRP;Melissa [place] RDN, Rowe Pavy, BA, ACSM CEP, Exercise Physiologist;Joseph Hood RCP,RRT,BSRT    Virtual Visit No    Medication changes reported     No    Fall or balance concerns reported    No    Warm-up and Cool-down Performed on first and last piece of equipment    Resistance Training Performed Yes    VAD Patient? No    PAD/SET Patient? No      Pain Assessment   Currently in Pain? No/denies              Social History   Tobacco Use  Smoking Status Never Smoker  Smokeless Tobacco Never Used    Goals Met:  Independence with exercise equipment Exercise tolerated well No report of cardiac concerns or symptoms  Goals Unmet:  Not Applicable  Comments: Pt able to follow exercise prescription today without complaint.  Will continue to monitor for progression.    Dr. Emily Filbert is Medical Director for [facility].

## 2020-10-24 ENCOUNTER — Encounter: Payer: Self-pay | Admitting: *Deleted

## 2020-10-24 DIAGNOSIS — J45909 Unspecified asthma, uncomplicated: Secondary | ICD-10-CM

## 2020-10-24 NOTE — Progress Notes (Signed)
Pulmonary Individual Treatment Plan  Patient Details  Name: SACHIKO METHOT MRN: [id number] Date of Birth: 23-Jul-1941 Referring Provider:   Flowsheet Row Pulmonary Rehab from 08/02/2020 in [facility] Pulmonary Rehab  Referring Provider Vida Rigger MD      Initial Encounter Date:  Flowsheet Row Pulmonary Rehab from 08/02/2020 in [facility] Cardiac and Pulmonary Rehab  Date 08/02/20      Visit Diagnosis: Uncomplicated asthma, unspecified asthma severity, unspecified whether persistent  Patient's Home Medications on Admission:  Current Outpatient Medications:  .  albuterol (VENTOLIN HFA) 108 (90 Base) MCG/ACT inhaler, Inhale into the lungs., Disp: , Rfl:  .  ascorbic Acid (VITAMIN C) 500 MG CPCR, Take by mouth., Disp: , Rfl:  .  aspirin EC 81 MG tablet, Take 81 mg by mouth daily., Disp: , Rfl:  .  BELSOMRA 10 MG TABS, Take 10 mg by mouth at bedtime. , Disp: , Rfl: 5 .  Calcium Carb-Cholecalciferol (CALTRATE 600+D3 PO), Take 1 tablet by mouth 2 (two) times daily., Disp: , Rfl:  .  Calcium Carbonate-Vitamin D 600-400 MG-UNIT tablet, Take by mouth., Disp: , Rfl:  .  cholecalciferol (VITAMIN D) 1000 UNITS tablet, Take 1,000 Units by mouth every evening. , Disp: , Rfl:  .  citalopram (CELEXA) 20 MG tablet, Take 20 mg by mouth daily at 8 pm. (1900), Disp: , Rfl:  .  citalopram (CELEXA) 20 MG tablet, Take 1 tablet by mouth daily., Disp: , Rfl:  .  clobetasol (TEMOVATE) 0.05 % external solution, MIX IN A TUB OF CERAVE CREAM AND USE EVERYDAY UP TO TWICE DAILY ON ITCHY RASH UNTIL CLEAR, AVOID FACE, GROIN, AXILLAA., Disp: 50 mL, Rfl: 0 .  cyanocobalamin (,VITAMIN B-12,) 1000 MCG/ML injection, Inject 1,000 mcg into the muscle every 30 (thirty) days. , Disp: , Rfl:  .  denosumab (PROLIA) 60 MG/ML SOSY injection, Inject 60 mg into the skin every 6 (six) months. , Disp: , Rfl:  .  Fe Fum-FA-B Cmp-C-Zn-Mg-Mn-Cu (HEMATINIC PLUS VIT/MINERALS) 106-1 MG TABS, Take 1 tablet by mouth daily with lunch. ,  Disp: , Rfl: 3 .  Fe Fum-FA-B Cmp-C-Zn-Mg-Mn-Cu (HEMATINIC PLUS VIT/MINERALS) 106-1 MG TABS, Take 1 tablet by mouth daily., Disp: , Rfl:  .  gabapentin (NEURONTIN) 100 MG capsule, Take 100-200 mg by mouth See admin instructions. Take 1 capsule (100 mg) by mouth in the morning & take 2 capsules (200 mg) by mouth at night., Disp: , Rfl:  .  HYDROcodone-acetaminophen (NORCO/VICODIN) 5-325 MG tablet, Take 1 tablet by mouth every 4 (four) hours as needed (pain.)., Disp: , Rfl:  .  megestrol (MEGACE) 40 MG/ML suspension, Take 800 mg by mouth daily., Disp: , Rfl:  .  megestrol (MEGACE) 40 MG/ML suspension, Take by mouth., Disp: , Rfl:  .  meloxicam (MOBIC) 7.5 MG tablet, Take 7.5 mg by mouth daily with supper. , Disp: , Rfl:  .  meloxicam (MOBIC) 7.5 MG tablet, Take 1 tablet by mouth daily., Disp: , Rfl:  .  mirtazapine (REMERON) 30 MG tablet, Take 30 mg by mouth at bedtime., Disp: , Rfl:  .  mirtazapine (REMERON) 45 MG tablet, Take 1 tablet by mouth at bedtime., Disp: , Rfl:  .  mirtazapine (REMERON) 45 MG tablet, , Disp: , Rfl:  .  Multiple Vitamins-Minerals (PRESERVISION AREDS) TABS, Take 1 tablet by mouth 2 (two) times daily. , Disp: , Rfl:  .  pantoprazole (PROTONIX) 40 MG tablet, Take 40 mg by mouth daily before supper. , Disp: , Rfl: 1 .  pantoprazole (PROTONIX) 40 MG tablet, Take 1 tablet by mouth daily., Disp: , Rfl:  .  Suvorexant (BELSOMRA) 10 MG TABS, Take 1 tablet by mouth daily., Disp: , Rfl:  .  Travoprost, BAK Free, (TRAVATAN) 0.004 % SOLN ophthalmic solution, SMARTSIG:1 Drop(s) In Eye(s) Every Evening, Disp: , Rfl:  .  travoprost, benzalkonium, (TRAVATAN) 0.004 % ophthalmic solution, Place 1 drop into both eyes at bedtime., Disp: , Rfl:  .  vitamin C (ASCORBIC ACID) 500 MG tablet, Take 500 mg by mouth 2 (two) times daily., Disp: , Rfl:   Past Medical History: Past Medical History:  Diagnosis Date  . Anemia    IRON INFUSIONS  . Arthritis    osteoarthritis/ RA  . Asthma    remote  h/o asthma  . Depression    controlled  . H/O hypoglycemia   . H/O: GI bleed   . Headache(784.0)    h/o migraines  . Hearing loss, bilateral    wears hearing aides  . History of blood transfusion    pt states she had yellow jaundice  . Hypertension    H/O NO TX NOW  . Lupus (systemic lupus erythematosus) ([place]) 1995   in remission since 2005  . Nasal sinus congestion   . Osteoporosis with fracture   . Stress fracture of foot    left foot  . Vitamin B 12 deficiency     Tobacco Use: Social History   Tobacco Use  Smoking Status Never Smoker  Smokeless Tobacco Never Used    Labs: Recent Review Flowsheet Data   There is no flowsheet data to display.      Pulmonary Assessment Scores:  Pulmonary Assessment Scores    Row Name 08/02/20 1609         ADL UCSD   ADL Phase Entry     SOB Score total 17     Rest 0     Walk 1     Stairs 3     Bath 0     Dress 0     Shop 0           CAT Score   CAT Score 8           mMRC Score   mMRC Score 1            UCSD: Self-administered rating of dyspnea associated with activities of daily living (ADLs) 6-point scale (0 = "not at all" to 5 = "maximal or unable to do because of breathlessness")  Scoring Scores range from 0 to 120.  Minimally important difference is 5 units  CAT: CAT can identify the health impairment of COPD patients and is better correlated with disease progression.  CAT has a scoring range of zero to 40. The CAT score is classified into four groups of low (less than 10), medium (10 - 20), high (21-30) and very high (31-40) based on the impact level of disease on health status. A CAT score over 10 suggests significant symptoms.  A worsening CAT score could be explained by an exacerbation, poor medication adherence, poor inhaler technique, or progression of COPD or comorbid conditions.  CAT MCID is 2 points  mMRC: mMRC (Modified Medical Research Council) Dyspnea Scale is used to assess the degree of  baseline functional disability in patients of respiratory disease due to dyspnea. No minimal important difference is established. A decrease in score of 1 point or greater is considered a positive change.   Pulmonary Function Assessment:  Pulmonary Function Assessment -  07/31/20 0940      Breath   Shortness of Breath Yes;Limiting activity           Exercise Target Goals: Exercise Program Goal: Individual exercise prescription set using results from initial 6 min walk test and THRR while considering  patient's activity barriers and safety.   Exercise Prescription Goal: Initial exercise prescription builds to 30-45 minutes a day of aerobic activity, 2-3 days per week.  Home exercise guidelines will be given to patient during program as part of exercise prescription that the participant will acknowledge.  Education: Aerobic Exercise: - Group verbal and visual presentation on the components of exercise prescription. Introduces F.I.T.T principle from ACSM for exercise prescriptions.  Reviews F.I.T.T. principles of aerobic exercise including progression. Written material given at graduation. Flowsheet Row Pulmonary Rehab from 10/04/2020 in [facility] Cardiac and Pulmonary Rehab  Date 08/23/20  Hilda Blades only on 10/28]  Educator [facility]  Instruction Review Code 1- [employer] Understanding      Education: Resistance Exercise: - Group verbal and visual presentation on the components of exercise prescription. Introduces F.I.T.T principle from ACSM for exercise prescriptions  Reviews F.I.T.T. principles of resistance exercise including progression. Written material given at graduation.    Education: Exercise & Equipment Safety: - Individual verbal instruction and demonstration of equipment use and safety with use of the equipment. Flowsheet Row Pulmonary Rehab from 10/04/2020 in [facility] Cardiac and Pulmonary Rehab  Date 07/31/20  Educator [facility]  Instruction Review Code 1- Verbalizes Understanding       Education: Exercise Physiology & General Exercise Guidelines: - Group verbal and written instruction with models to review the exercise physiology of the cardiovascular system and associated critical values. Provides general exercise guidelines with specific guidelines to those with heart or lung disease.    Education: Flexibility, Balance, Mind/Body Relaxation: - Group verbal and visual presentation with interactive activity on the components of exercise prescription. Introduces F.I.T.T principle from ACSM for exercise prescriptions. Reviews F.I.T.T. principles of flexibility and balance exercise training including progression. Also discusses the mind body connection.  Reviews various relaxation techniques to help reduce and manage stress (i.e. Deep breathing, progressive muscle relaxation, and visualization). Balance handout provided to take home. Written material given at graduation. Flowsheet Row Pulmonary Rehab from 10/04/2020 in [facility] Cardiac and Pulmonary Rehab  Date 08/30/20  Educator [facility]  Instruction Review Code 1- Verbalizes Understanding      Activity Barriers & Risk Stratification:  Activity Barriers & Cardiac Risk Stratification - 08/02/20 1620      Activity Barriers & Cardiac Risk Stratification   Activity Barriers Other (comment)    Comments 'Rod" placed in right leg           6 Minute Walk:  6 Minute Walk    Row Name 08/02/20 1612         6 Minute Walk   Phase Initial     Distance 1195 feet     Walk Time 6 minutes     # of Rest Breaks 9     MPH 2.26     METS 2.69     RPE 9     Perceived Dyspnea  0     VO2 Peak 9.44     Symptoms No     Resting HR 85 bpm     Resting BP 132/78     Resting Oxygen Saturation  98 %     Exercise Oxygen Saturation  during 6 min walk 93 %     Max Ex. HR 100  bpm     Max Ex. BP 158/82     2 Minute Post BP 136/80           Interval HR   1 Minute HR 94     2 Minute HR 95     3 Minute HR 99     4 Minute HR 96     5 Minute  HR 97     6 Minute HR 100     2 Minute Post HR 82     Interval Heart Rate? Yes           Interval Oxygen   Interval Oxygen? Yes     Baseline Oxygen Saturation % 98 %     1 Minute Oxygen Saturation % 96 %     1 Minute Liters of Oxygen 0 L  RA     2 Minute Oxygen Saturation % 96 %     2 Minute Liters of Oxygen 0 L     3 Minute Oxygen Saturation % 93 %     3 Minute Liters of Oxygen 0 L     4 Minute Oxygen Saturation % 94 %     4 Minute Liters of Oxygen 0 L     5 Minute Oxygen Saturation % 96 %     5 Minute Liters of Oxygen 0 L     6 Minute Oxygen Saturation % 93 %     6 Minute Liters of Oxygen 0 L     2 Minute Post Oxygen Saturation % 97 %     2 Minute Post Liters of Oxygen 0 L           Oxygen Initial Assessment:  Oxygen Initial Assessment - 08/02/20 1609      Home Oxygen   Home Oxygen Device None    Sleep Oxygen Prescription None    Home Exercise Oxygen Prescription None    Home Resting Oxygen Prescription None      Initial 6 min Walk   Oxygen Used None      Program Oxygen Prescription   Program Oxygen Prescription None      Intervention   Short Term Goals To learn and understand importance of monitoring SPO2 with pulse oximeter and demonstrate accurate use of the pulse oximeter.;To learn and understand importance of maintaining oxygen saturations>88%;To learn and demonstrate proper pursed lip breathing techniques or other breathing techniques.;To learn and demonstrate proper use of respiratory medications    Long  Term Goals Verbalizes importance of monitoring SPO2 with pulse oximeter and return demonstration;Maintenance of O2 saturations>88%;Exhibits proper breathing techniques, such as pursed lip breathing or other method taught during program session;Compliance with respiratory medication;Demonstrates proper use of MDI's           Oxygen Re-Evaluation:  Oxygen Re-Evaluation    Row Name 08/21/20 0955 09/11/20 0958 10/04/20 1030         Program Oxygen  Prescription   Program Oxygen Prescription None None None           Home Oxygen   Home Oxygen Device None None None     Sleep Oxygen Prescription None None None     Home Exercise Oxygen Prescription None None None     Home Resting Oxygen Prescription None None None           Goals/Expected Outcomes   Short Term Goals To learn and understand importance of monitoring SPO2 with pulse oximeter and demonstrate accurate use of the pulse oximeter.;To learn and  understand importance of maintaining oxygen saturations>88%;To learn and demonstrate proper pursed lip breathing techniques or other breathing techniques.;To learn and demonstrate proper use of respiratory medications To learn and understand importance of monitoring SPO2 with pulse oximeter and demonstrate accurate use of the pulse oximeter.;To learn and understand importance of maintaining oxygen saturations>88%;To learn and demonstrate proper pursed lip breathing techniques or other breathing techniques.;To learn and demonstrate proper use of respiratory medications To learn and understand importance of monitoring SPO2 with pulse oximeter and demonstrate accurate use of the pulse oximeter.;To learn and understand importance of maintaining oxygen saturations>88%;To learn and demonstrate proper pursed lip breathing techniques or other breathing techniques.;To learn and demonstrate proper use of respiratory medications     Long  Term Goals Verbalizes importance of monitoring SPO2 with pulse oximeter and return demonstration;Maintenance of O2 saturations>88%;Exhibits proper breathing techniques, such as pursed lip breathing or other method taught during program session;Compliance with respiratory medication;Demonstrates proper use of MDI's Verbalizes importance of monitoring SPO2 with pulse oximeter and return demonstration;Maintenance of O2 saturations>88%;Exhibits proper breathing techniques, such as pursed lip breathing or other method taught during  program session;Compliance with respiratory medication;Demonstrates proper use of MDI's Verbalizes importance of monitoring SPO2 with pulse oximeter and return demonstration;Maintenance of O2 saturations>88%;Exhibits proper breathing techniques, such as pursed lip breathing or other method taught during program session;Compliance with respiratory medication;Demonstrates proper use of MDI's     Comments Reviewed PLB technique with pt.  Talked about how it works and it's importance in maintaining their exercise saturations. She is on alberterol and uses the lung exercise equipment. She reports using PLB and feels that is more natural. She hasn't had to use her inhaler and the cold weather has not been affecting her breathing. Renate is doing better with her breathing.  She is using her PLB at home and finds it helpful.  She going to get a pulse oximeter for home use.  She is also using her inspirometer. to help with breathings.  Her meds seem to be doing well for her.     Goals/Expected Outcomes Short: Become more profiecient at using PLB.   Long: Become independent at using PLB. Short: continue with lung exercises and practicing PLB.  Long: Become independent at using PLB. Short: Get pulse oximeter for home use  Long; Continue to use PLB.            Oxygen Discharge (Final Oxygen Re-Evaluation):  Oxygen Re-Evaluation - 10/04/20 1030      Program Oxygen Prescription   Program Oxygen Prescription None      Home Oxygen   Home Oxygen Device None    Sleep Oxygen Prescription None    Home Exercise Oxygen Prescription None    Home Resting Oxygen Prescription None      Goals/Expected Outcomes   Short Term Goals To learn and understand importance of monitoring SPO2 with pulse oximeter and demonstrate accurate use of the pulse oximeter.;To learn and understand importance of maintaining oxygen saturations>88%;To learn and demonstrate proper pursed lip breathing techniques or other breathing techniques.;To  learn and demonstrate proper use of respiratory medications    Long  Term Goals Verbalizes importance of monitoring SPO2 with pulse oximeter and return demonstration;Maintenance of O2 saturations>88%;Exhibits proper breathing techniques, such as pursed lip breathing or other method taught during program session;Compliance with respiratory medication;Demonstrates proper use of MDI's    Comments Ziaira is doing better with her breathing.  She is using her PLB at home and finds it helpful.  She going to  get a pulse oximeter for home use.  She is also using her inspirometer. to help with breathings.  Her meds seem to be doing well for her.    Goals/Expected Outcomes Short: Get pulse oximeter for home use  Long; Continue to use PLB.           Initial Exercise Prescription:  Initial Exercise Prescription - 08/02/20 1600      Date of Initial Exercise RX and Referring Provider   Date 08/02/20    Referring Provider Ottie Glazier MD      Treadmill   MPH 2.3    Grade 0.5    Minutes 15    METs 2.92      Recumbant Bike   Level 2    RPM 60    Watts 10    Minutes 15    METs 2.69      NuStep   Level 3    SPM 80    Minutes 15    METs 2.69      Prescription Details   Frequency (times per week) 2    Duration Progress to 30 minutes of continuous aerobic without signs/symptoms of physical distress      Intensity   THRR 40-80% of Max Heartrate 107-129    Ratings of Perceived Exertion 11-13    Perceived Dyspnea 0-4      Resistance Training   Training Prescription Yes           Perform Capillary Blood Glucose checks as needed.  Exercise Prescription Changes:  Exercise Prescription Changes    Row Name 08/02/20 1600 08/23/20 1100 09/06/20 0700 09/19/20 1200 10/03/20 1000     Response to Exercise   Blood Pressure (Admit) 132/78 142/88 130/76 114/68 118/76   Blood Pressure (Exercise) 158/82 148/64 124/66 140/76 142/80   Blood Pressure (Exit) 130/78 110/60 104/68 106/68 118/72    Heart Rate (Admit) 85 bpm 83 bpm 85 bpm 80 bpm 87 bpm   Heart Rate (Exercise) 100 bpm 100 bpm 97 bpm 107 bpm 101 bpm   Heart Rate (Exit) 82 bpm 80 bpm 85 bpm 95 bpm 87 bpm   Oxygen Saturation (Admit) 98 % 97 % 96 % 98 % 98 %   Oxygen Saturation (Exercise) 93 % 95 % 98 % 96 % 96 %   Oxygen Saturation (Exit) 98 % 99 % 98 % 98 % 97 %   Rating of Perceived Exertion (Exercise) 9 11 11 11 11    Perceived Dyspnea (Exercise) 0 1 0 0 0   Symptoms none -- -- -- --   Comments walk test results first day none none none   Duration Progress to 30 minutes of  aerobic without signs/symptoms of physical distress -- Progress to 30 minutes of  aerobic without signs/symptoms of physical distress Continue with 30 min of aerobic exercise without signs/symptoms of physical distress. Continue with 30 min of aerobic exercise without signs/symptoms of physical distress.   Intensity -- -- THRR unchanged THRR unchanged THRR unchanged     Progression   Progression -- -- Continue to progress workloads to maintain intensity without signs/symptoms of physical distress. Continue to progress workloads to maintain intensity without signs/symptoms of physical distress. Continue to progress workloads to maintain intensity without signs/symptoms of physical distress.   Average METs -- -- 2.2 2 2.05     Resistance Training   Training Prescription -- Yes Yes Yes Yes   Weight 3 lb 1 lb 1 lb 1 lb 1 lb   Reps  10-15 10-15 10-15 10-15 10-15     Interval Training   Interval Training -- -- No No No     Treadmill   MPH -- 0.7 1.2 1.2 1.8   Grade -- 0 0.5 0.5 0.5   Minutes -- 15 15 15 15    METs -- 1.54 2 2 2.5     Recumbant Bike   Level -- -- 2 -- --   Minutes -- -- 15 -- --     NuStep   Level -- 3 3 3 3    SPM -- 80 -- 80 --   Minutes -- 15 15 15 15    METs -- 1.2 2.4 1.9 1.6          Exercise Comments:  Exercise Comments    Row Name 10/08/20 1342 10/24/20 0606         Exercise Comments Aniqua called to let us know  that she saw her doctor for her sciatica pain and he felt that when she tried the 3 lb weights, she may have pulled something and encouraged her to rest a week.  We also encouraged her to stretch and ice (if able) to help as well.  She will keep [place] posted on next week. Returned to program yesterday.  comcerned about her spinal stenosis and exercising without aggravating sciatica. Got her started back slowly with instruction to stop as needed.             Exercise Goals and Review:  Exercise Goals    Row Name 08/02/20 1630             Exercise Goals   Increase Physical Activity Yes       Intervention Provide advice, education, support and counseling about physical activity/exercise needs.;Develop an individualized exercise prescription for aerobic and resistive training based on initial evaluation findings, risk stratification, comorbidities and participant's personal goals.       Expected Outcomes Short Term: Attend rehab on a regular basis to increase amount of physical activity.;Long Term: Add in home exercise to make exercise part of routine and to increase amount of physical activity.;Long Term: Exercising regularly at least 3-5 days a week.       Increase Strength and Stamina Yes       Intervention Provide advice, education, support and counseling about physical activity/exercise needs.;Develop an individualized exercise prescription for aerobic and resistive training based on initial evaluation findings, risk stratification, comorbidities and participant's personal goals.       Expected Outcomes Short Term: Increase workloads from initial exercise prescription for resistance, speed, and METs.;Short Term: Perform resistance training exercises routinely during rehab and add in resistance training at home;Long Term: Improve cardiorespiratory fitness, muscular endurance and strength as measured by increased METs and functional capacity (6MWT)       Able to understand and use rate of perceived  exertion (RPE) scale Yes       Intervention Provide education and explanation on how to use RPE scale       Expected Outcomes Short Term: Able to use RPE daily in rehab to express subjective intensity level;Long Term:  Able to use RPE to guide intensity level when exercising independently       Able to understand and use Dyspnea scale Yes       Intervention Provide education and explanation on how to use Dyspnea scale       Expected Outcomes Short Term: Able to use Dyspnea scale daily in rehab to express subjective sense of shortness of  breath during exertion;Long Term: Able to use Dyspnea scale to guide intensity level when exercising independently       Knowledge and understanding of Target Heart Rate Range (THRR) Yes       Intervention Provide education and explanation of THRR including how the numbers were predicted and where they are located for reference       Expected Outcomes Short Term: Able to state/look up THRR;Short Term: Able to use daily as guideline for intensity in rehab;Long Term: Able to use THRR to govern intensity when exercising independently       Able to check pulse independently Yes       Intervention Provide education and demonstration on how to check pulse in carotid and radial arteries.;Review the importance of being able to check your own pulse for safety during independent exercise       Expected Outcomes Short Term: Able to explain why pulse checking is important during independent exercise;Long Term: Able to check pulse independently and accurately       Understanding of Exercise Prescription Yes       Intervention Provide education, explanation, and written materials on patient's individual exercise prescription       Expected Outcomes Short Term: Able to explain program exercise prescription;Long Term: Able to explain home exercise prescription to exercise independently              Exercise Goals Re-Evaluation :  Exercise Goals Re-Evaluation    Row Name  08/21/20 0955 09/06/20 0736 09/11/20 1001 09/19/20 1228 10/03/20 1013     Exercise Goal Re-Evaluation   Exercise Goals Review Increase Physical Activity;Able to understand and use rate of perceived exertion (RPE) scale;Knowledge and understanding of Target Heart Rate Range (THRR);Understanding of Exercise Prescription;Increase Strength and Stamina;Able to check pulse independently;Able to understand and use Dyspnea scale Increase Physical Activity;Increase Strength and Stamina;Understanding of Exercise Prescription Increase Physical Activity;Increase Strength and Stamina;Understanding of Exercise Prescription Increase Physical Activity;Increase Strength and Stamina;Understanding of Exercise Prescription Increase Physical Activity;Increase Strength and Stamina;Understanding of Exercise Prescription   Comments Reviewed RPE and dyspnea scales, THR and program prescription with pt today.  Pt voiced understanding and was given a copy of goals to take home. Maleyah is off to a good start in rehab.  She is already up to level 3 on the NuStep.  We will continue to monitor her progress. She walks her dog and goes to the park - 20-30 minutes (3x/week). Can't walk in her neighborhood due to large dogs. She does not do any weighted exercises at home. She has not reviewed home exercise yet. Eleanors oxygen levels are in 90s during exercise.  She reoprts RPE of 11.  She reaches THR range sometimes.  Staff will encourage increasing levels to reach THR range and review home exercise. Amazin is doing well in rehab.  She is already up to level 3 on the NuStep.  We will continue to monitor her progress.   Expected Outcomes Short: Use RPE daily to regulate intensity. Long: Follow program prescription in THR. Short: Continue to attend class regularly Long: Continue to follow program prescription ST: meet with EP regarding home exercise LT: continue attending pulmonary rehab consistently Short: review home exercise and THR range  Long: build overall stamina Short: try 2 lb weights Long: Continue to improve stamina.   [place] Name 10/04/20 1013             Exercise Goal Re-Evaluation   Exercise Goals Review Increase Physical Activity;Understanding of Exercise Prescription;Increase Strength  and Stamina;Able to understand and use rate of perceived exertion (RPE) scale;Able to understand and use Dyspnea scale;Knowledge and understanding of Target Heart Rate Range (THRR);Able to check pulse independently       Comments Elenor is doing well in rehab.  Her leg has been bothering her and she has an appointment tomorrow to look at her rod again.  She continues to chase her dog and cat each to stay active at home. Reviewed home exercise with pt today.  Pt plans to walk and use staff videos for exercise.  Reviewed THR, pulse, RPE, sign and symptoms, pulse oximetery and when to call 911 or MD.  Also discussed weather considerations and indoor options.  Pt voiced understanding.       Expected Outcomes Short: Start to add in more exercise at home Long: Continue to improve stamina.              Discharge Exercise Prescription (Final Exercise Prescription Changes):  Exercise Prescription Changes - 10/03/20 1000      Response to Exercise   Blood Pressure (Admit) 118/76    Blood Pressure (Exercise) 142/80    Blood Pressure (Exit) 118/72    Heart Rate (Admit) 87 bpm    Heart Rate (Exercise) 101 bpm    Heart Rate (Exit) 87 bpm    Oxygen Saturation (Admit) 98 %    Oxygen Saturation (Exercise) 96 %    Oxygen Saturation (Exit) 97 %    Rating of Perceived Exertion (Exercise) 11    Perceived Dyspnea (Exercise) 0    Comments none    Duration Continue with 30 min of aerobic exercise without signs/symptoms of physical distress.    Intensity THRR unchanged      Progression   Progression Continue to progress workloads to maintain intensity without signs/symptoms of physical distress.    Average METs 2.05      Resistance Training    Training Prescription Yes    Weight 1 lb    Reps 10-15      Interval Training   Interval Training No      Treadmill   MPH 1.8    Grade 0.5    Minutes 15    METs 2.5      NuStep   Level 3    Minutes 15    METs 1.6           Nutrition:  Target Goals: Understanding of nutrition guidelines, daily intake of sodium 1500mg , cholesterol 200mg , calories 30% from fat and 7% or less from saturated fats, daily to have 5 or more servings of fruits and vegetables.  Education: All About Nutrition: -Group instruction provided by verbal, written material, interactive activities, discussions, models, and posters to present general guidelines for heart healthy nutrition including fat, fiber, MyPlate, the role of sodium in heart healthy nutrition, utilization of the nutrition label, and utilization of this knowledge for meal planning. Follow up email sent as well. Written material given at graduation. Flowsheet Row Pulmonary Rehab from 10/04/2020 in [facility] Cardiac and Pulmonary Rehab  Date 09/06/20  Educator [facility]  Instruction Review Code 1- Verbalizes Understanding      Biometrics:    Nutrition Therapy Plan and Nutrition Goals:  Nutrition Therapy & Goals - 09/11/20 0947      Nutrition Therapy   Diet Pulmonary MNT, high kcal, high protien    Protein (specify units) 57g    Fiber 25 grams    Whole Grain Foods 3 servings    Saturated Fats 12 max.  grams    Fruits and Vegetables 5 servings/day    Sodium 1.5 grams      Personal Nutrition Goals   Nutrition Goal ST: add boost 2x/day, add fat and protein to meals (mashed potatoes with butter and cream with beans for a snack, nuts or nut butter and milk to oatmeal for example) LT: gain weight - 1st goal 100 lbs.    Comments Have not completed initial evaluation, however, did do one on one education with patient. She has continuously been losing weight and is at 88 pounds. She also reports needing more iron. Discussed heart healthy eating, high  calorie and high protein while still eating heart healthy. She is very interested in power bowls and loves fruit and vegetables. 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 She doesn't normally eat breakfast. She eats whatever she feels like at that time. She loves pasta - she is having altered taste. She reports having small meals. She reports eating a lot of eggs and cheese. 2 meals she will have a good protein source. She adds fats to her meals like bacon, olive oil, butter. She reports loving potatoes. She has about a fistful of food per meal. She reports getting a medication to help with appetite - her appetite has improved some. Discussed adding in boost 2x/day. Discussed heart healthy eating and MNT to gain weight. Small frequent meals, boost, protein at all meals, added fat to meals.      Intervention Plan   Intervention Prescribe, educate and counsel regarding individualized specific dietary modifications aiming towards targeted core components such as weight, hypertension, lipid management, diabetes, heart failure and other comorbidities.;Nutrition handout(s) given to patient.    Expected Outcomes Short Term Goal: Understand basic principles of dietary content, such as calories, fat, sodium, cholesterol and nutrients.;Short Term Goal: A plan has been developed with personal nutrition goals set during dietitian appointment.;Long Term Goal: Adherence to prescribed nutrition plan.           Nutrition Assessments:  Nutrition Assessments - 08/02/20 1609      MEDFICTS Scores   Pre Score 66          MEDIFICTS Score Key:  ?70 Need to make dietary changes   40-70 Heart Healthy Diet  ? 40 Therapeutic Level Cholesterol Diet   Picture Your Plate Scores:  <74 Unhealthy dietary pattern with much room for improvement.  41-50 Dietary pattern unlikely to meet recommendations for good health and room for improvement.  51-60 More healthful dietary pattern, with some room  for improvement.   >60 Healthy dietary pattern, although there may be some specific behaviors that could be improved.   Nutrition Goals Re-Evaluation:  Nutrition Goals Re-Evaluation    Row Name 10/04/20 1026             Goals   Nutrition Goal ST: add boost 2x/day, add fat and protein to meals (mashed potatoes with butter and cream with beans for a snack, nuts or nut butter and milk to oatmeal for example) LT: gain weight - 1st goal 100 lbs.       Comment Halei has not been gaining any weight. She is taking the magesterol, but her appetite is lacking this week. She is using the boost daily now.  The ensure upsets her stomach, but boost is doing well.  She is trying to add in the fat and protein, but still hard to cook for one.  We talked about freezing some to help out.  Expected Outcome Short: Try cooking and freezing meals  and continue with boost Long: Continue work on weight gain              Nutrition Goals Discharge (Final Nutrition Goals Re-Evaluation):  Nutrition Goals Re-Evaluation - 10/04/20 1026      Goals   Nutrition Goal ST: add boost 2x/day, add fat and protein to meals (mashed potatoes with butter and cream with beans for a snack, nuts or nut butter and milk to oatmeal for example) LT: gain weight - 1st goal 100 lbs.    Comment Beyonka has not been gaining any weight. She is taking the magesterol, but her appetite is lacking this week. She is using the boost daily now.  The ensure upsets her stomach, but boost is doing well.  She is trying to add in the fat and protein, but still hard to cook for one.  We talked about freezing some to help out.    Expected Outcome Short: Try cooking and freezing meals  and continue with boost Long: Continue work on weight gain           Psychosocial: Target Goals: Acknowledge presence or absence of significant depression and/or stress, maximize coping skills, provide positive support system. Participant is able to verbalize  types and ability to use techniques and skills needed for reducing stress and depression.   Education: Stress, Anxiety, and Depression - Group verbal and visual presentation to define topics covered.  Reviews how body is impacted by stress, anxiety, and depression.  Also discusses healthy ways to reduce stress and to treat/manage anxiety and depression.  Written material given at graduation. Flowsheet Row Pulmonary Rehab from 10/04/2020 in [facility] Cardiac and Pulmonary Rehab  Date 10/04/20  Educator SB  Instruction Review Code 1- [employer] Understanding      Education: Sleep Hygiene -Provides group verbal and written instruction about how sleep can affect your health.  Define sleep hygiene, discuss sleep cycles and impact of sleep habits. Review good sleep hygiene tips.    Initial Review & Psychosocial Screening:  Initial Psych Review & Screening - 07/31/20 0943      Initial Review   Current issues with Current Psychotropic Meds;Current Stress Concerns    Comments Her back bothers her and is wanting to do more exercise to help with mood.      Family Dynamics   Good Support System? Yes    Comments She can look to her cousin and friend for support.      Barriers   Psychosocial barriers to participate in program The patient should benefit from training in stress management and relaxation.      Screening Interventions   Interventions Encouraged to exercise;To provide support and resources with identified psychosocial needs;Provide feedback about the scores to participant    Expected Outcomes Short Term goal: Utilizing psychosocial counselor, staff and physician to assist with identification of specific Stressors or current issues interfering with healing process. Setting desired goal for each stressor or current issue identified.;Long Term Goal: Stressors or current issues are controlled or eliminated.;Short Term goal: Identification and review with participant of any Quality of Life or  Depression concerns found by scoring the questionnaire.;Long Term goal: The participant improves quality of Life and PHQ9 Scores as seen by post scores and/or verbalization of changes           Quality of Life Scores:  Scores of 19 and below usually indicate a poorer quality of life in these areas.  A difference of  2-3 points is a clinically meaningful difference.  A difference of 2-3 points in the total score of the Quality of Life Index has been associated with significant improvement in overall quality of life, self-image, physical symptoms, and general health in studies assessing change in quality of life.  PHQ-9: Recent Review Flowsheet Data    Depression screen [facility] 2/9 08/02/2020   Decreased Interest 0   Down, Depressed, Hopeless 0   PHQ - 2 Score 0   Altered sleeping 0   Tired, decreased energy 1   Change in appetite 1   Feeling bad or failure about yourself  0   Trouble concentrating 0   Moving slowly or fidgety/restless 0   Suicidal thoughts 0   PHQ-9 Score 2   Difficult doing work/chores Not difficult at all     Interpretation of Total Score  Total Score Depression Severity:  1-4 = Minimal depression, 5-9 = Mild depression, 10-14 = Moderate depression, 15-19 = Moderately severe depression, 20-27 = Severe depression   Psychosocial Evaluation and Intervention:  Psychosocial Evaluation - 07/31/20 0945      Psychosocial Evaluation & Interventions   Interventions Encouraged to exercise with the program and follow exercise prescription;Relaxation education;Stress management education    Comments She can look to her cousin and friend for support. She states that with everything that is going on with COVID is stressful.    Expected Outcomes Short: Exercise regularly to support mental health and notify staff of any changes. Long: maintain mental health and well being through teaching of rehab or prescribed medications independently.    Continue Psychosocial Services  Follow up  required by staff           Psychosocial Re-Evaluation:  Psychosocial Re-Evaluation    [place] Name 09/11/20 1004 10/04/20 1024           Psychosocial Re-Evaluation   Current issues with Current Stress Concerns;Current Psychotropic Meds Current Stress Concerns;Current Psychotropic Meds      Comments She has recently had a death in her family and is going to the funeral this Thursday. She has some stress regarding the current state of the world. She is on an anti-anxiety medication after a bad divorce and reports doing well on it. She rpeorts having a good support system - 7 year old friend who she speaks to daily, cousin was a good support system - passed recently - his children are supporting her, church supports her as well. She is on a sleep medication which has been helping - she was never a good sleeper - she sleeps at 11pm and wakes up to her dog at 7am. She reads to help manage stress and croquets. Elenor is doing well in rehab. She tries not to let things bother her as much, she does not have as much family left.  She has a couple of cousins left that support her and her chuch family.  She continues to sleep good.  She continues to do well on her meds.      Expected Outcomes ST: Use her support system during this time of grieving LT: continue to use realxing activities and take medication as directed. short: Continue to lean on family as needed  Long: Continue to make time to rest and recover.      Interventions Encouraged to attend Pulmonary Rehabilitation for the exercise Encouraged to attend Pulmonary Rehabilitation for the exercise      Continue Psychosocial Services  Follow up required by staff --  Comments She has recently had a death in her family and is going to the funeral this Thursday. She has some stress regarding the current state of the world. She is on an anti-anxiety medication after a bad divorce and reports doing well on it. She rpeorts having a good support system - 43  year old friend who she speaks to daily, cousin was a good support system - passed recently - his children are supporting her, church supports her as well. She is on a sleep medication which has been helping - she was never a good sleeper - she sleeps at 11pm and wakes up to her dog at 7am. She reads to help manage stress and croquets. --             Initial Review   Source of Stress Concerns Family --             Psychosocial Discharge (Final Psychosocial Re-Evaluation):  Psychosocial Re-Evaluation - 10/04/20 1024      Psychosocial Re-Evaluation   Current issues with Current Stress Concerns;Current Psychotropic Meds    Comments Elenor is doing well in rehab. She tries not to let things bother her as much, she does not have as much family left.  She has a couple of cousins left that support her and her chuch family.  She continues to sleep good.  She continues to do well on her meds.    Expected Outcomes short: Continue to lean on family as needed  Long: Continue to make time to rest and recover.    Interventions Encouraged to attend Pulmonary Rehabilitation for the exercise           Education: Education Goals: Education classes will be provided on a weekly basis, covering required topics. Participant will state understanding/return demonstration of topics presented.  Learning Barriers/Preferences:  Learning Barriers/Preferences - 07/31/20 0941      Learning Barriers/Preferences   Learning Barriers Hearing    Learning Preferences None           General Pulmonary Education Topics:  Infection Prevention: - Provides verbal and written material to individual with discussion of infection control including proper hand washing and proper equipment cleaning during exercise session. Flowsheet Row Pulmonary Rehab from 10/04/2020 in [facility] Cardiac and Pulmonary Rehab  Date 07/31/20  Educator [facility]  Instruction Review Code 1- Verbalizes Understanding      Falls Prevention: - Provides  verbal and written material to individual with discussion of falls prevention and safety. Flowsheet Row Pulmonary Rehab from 10/04/2020 in [facility] Cardiac and Pulmonary Rehab  Date 07/31/20  Educator [facility]  Instruction Review Code 1- Verbalizes Understanding      Chronic Lung Disease Review: - Group verbal instruction with posters, models, PowerPoint presentations and videos,  to review new updates, new respiratory medications, new advancements in procedures and treatments. Providing information on websites and "800" numbers for continued self-education. Includes information about supplement oxygen, available portable oxygen systems, continuous and intermittent flow rates, oxygen safety, concentrators, and Medicare reimbursement for oxygen. Explanation of Pulmonary Drugs, including class, frequency, complications, importance of spacers, rinsing mouth after steroid MDI's, and proper cleaning methods for nebulizers. Review of basic lung anatomy and physiology related to function, structure, and complications of lung disease. Review of risk factors. Discussion about methods for diagnosing sleep apnea and types of masks and machines for OSA. Includes a review of the use of types of environmental controls: home humidity, furnaces, filters, dust mite/pet prevention, HEPA vacuums. Discussion about weather changes, air quality  and the benefits of nasal washing. Instruction on Warning signs, infection symptoms, calling MD promptly, preventive modes, and value of vaccinations. Review of effective airway clearance, coughing and/or vibration techniques. Emphasizing that all should Create an Action Plan. Written material given at graduation. Flowsheet Row Pulmonary Rehab from 10/04/2020 in [facility] Cardiac and Pulmonary Rehab  Date 09/27/20  Educator jh  Instruction Review Code 1- Verbalizes Understanding      AED/CPR: - Group verbal and written instruction with the use of models to demonstrate the basic use of the AED  with the basic ABC's of resuscitation.    Anatomy and Cardiac Procedures: - Group verbal and visual presentation and models provide information about basic cardiac anatomy and function. Reviews the testing methods done to diagnose heart disease and the outcomes of the test results. Describes the treatment choices: Medical Management, Angioplasty, or Coronary Bypass Surgery for treating various heart conditions including Myocardial Infarction, Angina, Valve Disease, and Cardiac Arrhythmias.  Written material given at graduation. Flowsheet Row Pulmonary Rehab from 10/04/2020 in [facility] Cardiac and Pulmonary Rehab  Date 08/23/20  Educator SB  Instruction Review Code 1- Verbalizes Understanding      Medication Safety: - Group verbal and visual instruction to review commonly prescribed medications for heart and lung disease. Reviews the medication, class of the drug, and side effects. Includes the steps to properly store meds and maintain the prescription regimen.  Written material given at graduation.   Other: -Provides group and verbal instruction on various topics (see comments)   Knowledge Questionnaire Score:  Knowledge Questionnaire Score - 08/02/20 1611      Knowledge Questionnaire Score   Pre Score 14/18: Oxygen, ADLs,            Core Components/Risk Factors/Patient Goals at Admission:  Personal Goals and Risk Factors at Admission - 08/02/20 1631      Core Components/Risk Factors/Patient Goals on Admission    Weight Management Yes;Weight Maintenance    Intervention Weight Management: Provide education and appropriate resources to help participant work on and attain dietary goals.;Weight Management/Obesity: Establish reasonable short term and long term weight goals.;Weight Management: Develop a combined nutrition and exercise program designed to reach desired caloric intake, while maintaining appropriate intake of nutrient and fiber, sodium and fats, and appropriate energy  expenditure required for the weight goal.    Admit Weight 92 lb 11.2 oz (42 kg)    Goal Weight: Short Term 92 lb 11.2 oz (42 kg)    Goal Weight: Long Term 92 lb 11.2 oz (42 kg)    Expected Outcomes Short Term: Continue to assess and modify interventions until short term weight is achieved;Long Term: Adherence to nutrition and physical activity/exercise program aimed toward attainment of established weight goal;Weight Maintenance: Understanding of the daily nutrition guidelines, which includes 25-35% calories from fat, 7% or less cal from saturated fats, less than 200mg  cholesterol, less than 1.5gm of sodium, & 5 or more servings of fruits and vegetables daily;Understanding recommendations for meals to include 15-35% energy as protein, 25-35% energy from fat, 35-60% energy from carbohydrates, less than 200mg  of dietary cholesterol, 20-35 gm of total fiber daily;Understanding of distribution of calorie intake throughout the day with the consumption of 4-5 meals/snacks    Improve shortness of breath with ADL's Yes    Intervention Provide education, individualized exercise plan and daily activity instruction to help decrease symptoms of SOB with activities of daily living.    Expected Outcomes Short Term: Improve cardiorespiratory fitness to achieve a reduction of symptoms  when performing ADLs;Long Term: Be able to perform more ADLs without symptoms or delay the onset of symptoms    Hypertension Yes    Intervention Provide education on lifestyle modifcations including regular physical activity/exercise, weight management, moderate sodium restriction and increased consumption of fresh fruit, vegetables, and low fat dairy, alcohol moderation, and smoking cessation.;Monitor prescription use compliance.    Expected Outcomes Short Term: Continued assessment and intervention until BP is < 140/78mm HG in hypertensive participants. < 130/12mm HG in hypertensive participants with diabetes, heart failure or chronic  kidney disease.;Long Term: Maintenance of blood pressure at goal levels.           Education:Diabetes - Individual verbal and written instruction to review signs/symptoms of diabetes, desired ranges of glucose level fasting, after meals and with exercise. Acknowledge that pre and post exercise glucose checks will be done for 3 sessions at entry of program.   Know Your Numbers and Heart Failure: - Group verbal and visual instruction to discuss disease risk factors for cardiac and pulmonary disease and treatment options.  Reviews associated critical values for Overweight/Obesity, Hypertension, Cholesterol, and Diabetes.  Discusses basics of heart failure: signs/symptoms and treatments.  Introduces Heart Failure Zone chart for action plan for heart failure.  Written material given at graduation. Flowsheet Row Pulmonary Rehab from 10/04/2020 in [facility] Cardiac and Pulmonary Rehab  Date 09/18/20  Educator [facility]  Instruction Review Code 1- Verbalizes Understanding      Core Components/Risk Factors/Patient Goals Review:   Goals and Risk Factor Review    Row Name 09/11/20 1011 10/04/20 1029           Core Components/Risk Factors/Patient Goals Review   Personal Goals Review Improve shortness of breath with ADL's;Develop more efficient breathing techniques such as purse lipped breathing and diaphragmatic breathing and practicing self-pacing with activity. Improve shortness of breath with ADL's;Weight Management/Obesity      Review She reports her shortness of breath has gotten better since her pnemonia - practices PLB and breathing exercises. She is working on self pacing during class - will talk about home exercise with EP. She is not on any BP medication - she does not have any hypertension at this time. Resting BP today 116/58. Mykelle's weight is up a little.  She is trying to get back to 93 lb, but her eating is not where she wants it to be.  This week her appetite is not where she wants it to be.  Her breathing is getting better overall and she is able to do more at home. Pressures continue to do well.      Expected Outcomes ST: Continue to practice breathing techniques LT: talk to EP about home exercise Short: Continue to work on weight gain Long; Continue to monitor risk factors.             Core Components/Risk Factors/Patient Goals at Discharge (Final Review):   Goals and Risk Factor Review - 10/04/20 1029      Core Components/Risk Factors/Patient Goals Review   Personal Goals Review Improve shortness of breath with ADL's;Weight Management/Obesity    Review Kersti's weight is up a little.  She is trying to get back to 93 lb, but her eating is not where she wants it to be.  This week her appetite is not where she wants it to be. Her breathing is getting better overall and she is able to do more at home. Pressures continue to do well.    Expected Outcomes Short: Continue to work  on weight gain Long; Continue to monitor risk factors.           ITP Comments:  ITP Comments    Row Name 07/31/20 0950 08/02/20 1607 08/21/20 0954 08/29/20 0657 09/26/20 1021   ITP Comments Virtual Visit completed. Patient informed on EP and RD appointment and 6 Minute walk test. Patient also informed of patient health questionnaires on My Chart. Patient Verbalizes understanding. Visit diagnosis can be found in [facility] 07/26/2020. Completed 6MWT and gym orientation. Initial ITP created and sent for review to Dr. Emily Filbert, Medical Director. First full day of exercise!  Patient was oriented to gym and equipment including functions, settings, policies, and procedures.  Patient's individual exercise prescription and treatment plan were reviewed.  All starting workloads were established based on the results of the 6 minute walk test done at initial orientation visit.  The plan for exercise progression was also introduced and progression will be customized based on patient's performance and goals. 30 Day review  completed. Medical Director ITP review done, changes made as directed, and signed approval by Medical Director. 30 Day review completed. Medical Director ITP review done, changes made as directed, and signed approval by Medical Director.   [place] Name 10/08/20 1342 10/17/20 1248 10/24/20 0604       ITP Comments Amberia called to let us know that she saw her doctor for her sciatica pain and he felt that when she tried the 3 lb weights, she may have pulled something and encouraged her to rest a week.  We also encouraged her to stretch and ice (if able) to help as well.  She will keep [place] posted on next week. Kaitlynne has been out since last review. 30 Day review completed. Medical Director ITP review done, changes made as directed, and signed approval by Medical Director. Returned to program yesterday.  comcerned about her spinal stenosis and exercising without aggravating sciatica. Got her started back slowly with instruction to stop as needed.            Comments:

## 2020-10-25 ENCOUNTER — Other Ambulatory Visit: Payer: Self-pay

## 2020-10-25 ENCOUNTER — Encounter: Payer: Medicare PPO | Admitting: *Deleted

## 2020-10-25 DIAGNOSIS — J45909 Unspecified asthma, uncomplicated: Secondary | ICD-10-CM

## 2020-10-25 NOTE — Progress Notes (Signed)
Daily Session Note  Patient Details  Name: Renee Cabrera MRN: [id number] Date of Birth: 08-27-1941 Referring Provider:   Flowsheet Row Pulmonary Rehab from 08/02/2020 in [facility]  Referring Provider Ottie Glazier MD      Encounter Date: 10/25/2020  Check In:  Session Check In - 10/25/20 1035      Check-In   Supervising physician immediately available to respond to emergencies See telemetry face sheet for immediately available ER MD    Location [facility]-Cardiac & Pulmonary Rehab    Staff Present Hope Budds RDN, Rowe Pavy, BA, ACSM CEP, Exercise Physiologist;Kara Eliezer Bottom, MS Exercise Physiologist;Chaquana Nichols, RN, BSN, CCRP    Medication changes reported     No    Fall or balance concerns reported    No    Warm-up and Cool-down Performed on first and last piece of equipment    Resistance Training Performed Yes    VAD Patient? No    PAD/SET Patient? No      Pain Assessment   Currently in Pain? No/denies              Social History   Tobacco Use  Smoking Status Never Smoker  Smokeless Tobacco Never Used    Goals Met:  Proper associated with RPD/PD & O2 Sat Independence with exercise equipment Exercise tolerated well No report of cardiac concerns or symptoms  Goals Unmet:  Not Applicable  Comments: Pt able to follow exercise prescription today without complaint.  Will continue to monitor for progression.    Dr. Emily Filbert is Medical Director for [facility].

## 2020-10-30 ENCOUNTER — Encounter: Payer: Medicare PPO | Attending: Pulmonary Disease | Admitting: *Deleted

## 2020-10-30 ENCOUNTER — Other Ambulatory Visit: Payer: Self-pay

## 2020-10-30 DIAGNOSIS — J45909 Unspecified asthma, uncomplicated: Secondary | ICD-10-CM | POA: Diagnosis not present

## 2020-10-30 NOTE — Progress Notes (Signed)
Daily Session Note  Patient Details  Name: Renee Cabrera MRN: [id number] Date of Birth: 1941-04-29 Referring Provider:   Flowsheet Row Pulmonary Rehab from 08/02/2020 in [facility] and Pul[facility] Rehab  Referring Provider Ottie Glazier MD      Encounter Date: 10/30/2020  Check In:      Social History   Tobacco Use  Smoking Status Never Smoker  Smokeless Tobacco Never Used    Goals Met:  Proper associated with RPD/PD & O2 Sat Independence with exercise equipment Exercise tolerated well No report of cardiac concerns or symptoms  Goals Unmet:  Not Applicable  Comments: Pt able to follow exercise prescription today without complaint.  Will continue to monitor for progression.    Dr. Emily Filbert is Medical Director for [facility].

## 2020-10-31 DIAGNOSIS — M461 Sacroiliitis, not elsewhere classified: Secondary | ICD-10-CM | POA: Diagnosis not present

## 2020-10-31 DIAGNOSIS — R634 Abnormal weight loss: Secondary | ICD-10-CM | POA: Diagnosis not present

## 2020-10-31 DIAGNOSIS — E538 Deficiency of other specified B group vitamins: Secondary | ICD-10-CM | POA: Diagnosis not present

## 2020-10-31 DIAGNOSIS — E559 Vitamin D deficiency, unspecified: Secondary | ICD-10-CM | POA: Diagnosis not present

## 2020-10-31 DIAGNOSIS — N1832 Chronic kidney disease, stage 3b: Secondary | ICD-10-CM | POA: Diagnosis not present

## 2020-10-31 DIAGNOSIS — I129 Hypertensive chronic kidney disease with stage 1 through stage 4 chronic kidney disease, or unspecified chronic kidney disease: Secondary | ICD-10-CM | POA: Diagnosis not present

## 2020-10-31 DIAGNOSIS — E875 Hyperkalemia: Secondary | ICD-10-CM | POA: Diagnosis not present

## 2020-10-31 DIAGNOSIS — D649 Anemia, unspecified: Secondary | ICD-10-CM | POA: Diagnosis not present

## 2020-11-01 DIAGNOSIS — J45909 Unspecified asthma, uncomplicated: Secondary | ICD-10-CM

## 2020-11-01 NOTE — Progress Notes (Signed)
Renee Cabrera came in today after her MD appointment to let us know that she was advised to drop from the program to focus on weight loss. Renee Cabrera reports that the MD read my note with her and that he will be sending her to an RD. He is keeping her on Megace to help with her appetite. We will put her on a medical hold for now and see how her weight is improving to see when we can bring her back; we will reassess in a couple of months, told her to keep [place] updated.

## 2020-11-14 ENCOUNTER — Other Ambulatory Visit: Payer: Medicare PPO

## 2020-11-16 ENCOUNTER — Ambulatory Visit: Payer: Medicare PPO | Admitting: Internal Medicine

## 2020-11-16 ENCOUNTER — Ambulatory Visit: Payer: Medicare PPO

## 2020-11-20 ENCOUNTER — Other Ambulatory Visit: Payer: Self-pay

## 2020-11-20 ENCOUNTER — Ambulatory Visit: Payer: Medicare PPO | Admitting: Dermatology

## 2020-11-20 DIAGNOSIS — L814 Other melanin hyperpigmentation: Secondary | ICD-10-CM | POA: Diagnosis not present

## 2020-11-20 DIAGNOSIS — Z872 Personal history of diseases of the skin and subcutaneous tissue: Secondary | ICD-10-CM

## 2020-11-20 DIAGNOSIS — L821 Other seborrheic keratosis: Secondary | ICD-10-CM

## 2020-11-20 DIAGNOSIS — L409 Psoriasis, unspecified: Secondary | ICD-10-CM

## 2020-11-20 DIAGNOSIS — L853 Xerosis cutis: Secondary | ICD-10-CM

## 2020-11-20 DIAGNOSIS — W228XXA Striking against or struck by other objects, initial encounter: Secondary | ICD-10-CM | POA: Diagnosis not present

## 2020-11-20 DIAGNOSIS — D1800 Hemangioma unspecified site: Secondary | ICD-10-CM

## 2020-11-20 DIAGNOSIS — T1490XA Injury, unspecified, initial encounter: Secondary | ICD-10-CM | POA: Diagnosis not present

## 2020-11-20 DIAGNOSIS — T1490XD Injury, unspecified, subsequent encounter: Secondary | ICD-10-CM

## 2020-11-20 DIAGNOSIS — Z1283 Encounter for screening for malignant neoplasm of skin: Secondary | ICD-10-CM | POA: Diagnosis not present

## 2020-11-20 DIAGNOSIS — L578 Other skin changes due to chronic exposure to nonionizing radiation: Secondary | ICD-10-CM

## 2020-11-20 DIAGNOSIS — D229 Melanocytic nevi, unspecified: Secondary | ICD-10-CM | POA: Diagnosis not present

## 2020-11-20 MED ORDER — KETOCONAZOLE 2 % EX SHAM: 1.0000 "application " | MEDICATED_SHAMPOO | Freq: Every day | CUTANEOUS | 4 refills | Status: DC

## 2020-11-20 NOTE — Patient Instructions (Addendum)

## 2020-11-20 NOTE — Progress Notes (Signed)
   Follow-Up Visit   Subjective  Renee Cabrera is a 80 y.o. female who presents for the following: Annual Exam (Patient here today for yearly TBSE. Patient states she has noticed some break outs on scalp. Patient denies any history of skin cancers. ). She has a h/o psoriasis, alopecia areata, and lupus.  She currently uses Taclonex on itchy bumps on scalp, but still itches all over.  She also has itchy dry skin for which she uses Clob/CeraVe cream as needed.  Patient here for full body skin exam and skin cancer screening.    The following portions of the chart were reviewed this encounter and updated as appropriate:        Objective  Well appearing patient in no apparent distress; mood and affect are within normal limits.  A full examination was performed including scalp, head, eyes, ears, nose, lips, neck, chest, axillae, abdomen, back, buttocks, bilateral upper extremities, bilateral lower extremities, hands, feet, fingers, toes, fingernails, and toenails. All findings within normal limits unless otherwise noted below.  Objective  Mid Back: Improved   Objective  Scalp: Few scattered crusted excoriated papules   Mild scaling in scalp   Objective  Left Lower Leg - Anterior: Crusted pink excoriation  Assessment & Plan  Xerosis cutis Mid Back  With pruritus- improved  Continue using clobetasol and cerave mixture 1 to 2 times daily as needed.  Pt has.  Psoriasis Scalp  Scalp, with pruritus  Continue using taclonex scalp suspension qhs to aas scalp. Pt has.  Start Ketoconazole 2 % shampoo - use daily, massage into scalp and leave in for 10 minutes before rinsing out  Psoriasis is a chronic non-curable, but treatable genetic/hereditary disease that may have other systemic features affecting other organ systems such as joints (Psoriatic Arthritis). It is associated with an increased risk of inflammatory bowel disease, heart disease, non-alcoholic fatty liver disease,  and depression.       Ordered Medications: ketoconazole (NIZORAL) 2 % shampoo  Healing wound Left Lower Leg - Anterior  H/o trauma- bumped into corner of cupboard Continue topical antibiotic/bandaid change daily until healed   Lentigines - Scattered tan macules - Discussed due to sun exposure - Benign, observe - Call for any changes  Seborrheic Keratoses Arms, thighs, back - Stuck-on, waxy, tan-brown papules and plaques  - Discussed benign etiology and prognosis. - Observe - Call for any changes  Melanocytic Nevi - Tan-brown and/or pink-flesh-colored symmetric macules and papules - Benign appearing on exam today - Observation - Call clinic for new or changing moles - Recommend daily use of broad spectrum spf 30+ sunscreen to sun-exposed areas.   Hemangiomas - Red papules - Discussed benign nature - Observe - Call for any changes  Actinic Damage - Chronic, secondary to cumulative UV/sun exposure - diffuse scaly erythematous macules with underlying dyspigmentation - Recommend daily broad spectrum sunscreen SPF 30+ to sun-exposed areas, reapply every 2 hours as needed.  - Call for new or changing lesions.  Skin cancer screening performed today.  Return in about 1 year (around 11/20/2021) for TBSE, psoriasis.  I, Ruthell Rummage, CMA, am acting as scribe for Brendolyn Patty, MD.  Documentation: I have reviewed the above documentation for accuracy and completeness, and I agree with the above.  Brendolyn Patty MD

## 2020-11-21 ENCOUNTER — Encounter: Payer: Self-pay | Admitting: *Deleted

## 2020-11-21 DIAGNOSIS — J45909 Unspecified asthma, uncomplicated: Secondary | ICD-10-CM

## 2020-11-21 NOTE — Progress Notes (Signed)
Pulmonary Individual Treatment Plan  Patient Details  Name: Renee Cabrera MRN: [id number] Date of Birth: 02/17/1941 Referring Provider:   Flowsheet Row Pulmonary Rehab from 08/02/2020 in [facility] Cardiac and Pulmonary Rehab  Referring Provider Ottie Glazier MD      Initial Encounter Date:  Flowsheet Row Pulmonary Rehab from 08/02/2020 in [facility] Cardiac and Pulmonary Rehab  Date 08/02/20      Visit Diagnosis: Uncomplicated asthma, unspecified asthma severity, unspecified whether persistent  Patient's Home Medications on Admission:  Current Outpatient Medications:  .  albuterol (VENTOLIN HFA) 108 (90 Base) MCG/ACT inhaler, Inhale into the lungs., Disp: , Rfl:  .  ascorbic Acid (VITAMIN C) 500 MG CPCR, Take by mouth., Disp: , Rfl:  .  aspirin EC 81 MG tablet, Take 81 mg by mouth daily., Disp: , Rfl:  .  BELSOMRA 10 MG TABS, Take 10 mg by mouth at bedtime. , Disp: , Rfl: 5 .  Calcium Carb-Cholecalciferol (CALTRATE 600+D3 PO), Take 1 tablet by mouth 2 (two) times daily., Disp: , Rfl:  .  Calcium Carbonate-Vitamin D 600-400 MG-UNIT tablet, Take by mouth., Disp: , Rfl:  .  cholecalciferol (VITAMIN D) 1000 UNITS tablet, Take 1,000 Units by mouth every evening. , Disp: , Rfl:  .  citalopram (CELEXA) 20 MG tablet, Take 20 mg by mouth daily at 8 pm. (1900), Disp: , Rfl:  .  citalopram (CELEXA) 20 MG tablet, Take 1 tablet by mouth daily., Disp: , Rfl:  .  clobetasol (TEMOVATE) 0.05 % external solution, MIX IN A TUB OF CERAVE CREAM AND USE EVERYDAY UP TO TWICE DAILY ON ITCHY RASH UNTIL CLEAR, AVOID FACE, GROIN, AXILLAA., Disp: 50 mL, Rfl: 0 .  cyanocobalamin (,VITAMIN B-12,) 1000 MCG/ML injection, Inject 1,000 mcg into the muscle every 30 (thirty) days. , Disp: , Rfl:  .  denosumab (PROLIA) 60 MG/ML SOSY injection, Inject 60 mg into the skin every 6 (six) months. , Disp: , Rfl:  .  Fe Fum-FA-B Cmp-C-Zn-Mg-Mn-Cu (HEMATINIC PLUS VIT/MINERALS) 106-1 MG TABS, Take 1 tablet by mouth daily with lunch. ,  Disp: , Rfl: 3 .  Fe Fum-FA-B Cmp-C-Zn-Mg-Mn-Cu (HEMATINIC PLUS VIT/MINERALS) 106-1 MG TABS, Take 1 tablet by mouth daily., Disp: , Rfl:  .  gabapentin (NEURONTIN) 100 MG capsule, Take 100-200 mg by mouth See admin instructions. Take 1 capsule (100 mg) by mouth in the morning & take 2 capsules (200 mg) by mouth at night., Disp: , Rfl:  .  HYDROcodone-acetaminophen (NORCO/VICODIN) 5-325 MG tablet, Take 1 tablet by mouth every 4 (four) hours as needed (pain.)., Disp: , Rfl:  .  ketoconazole (NIZORAL) 2 % shampoo, Apply 1 application topically daily. massage into scalp and leave in for 10 minutes before rinsing out, Disp: 120 mL, Rfl: 4 .  megestrol (MEGACE) 40 MG/ML suspension, Take 800 mg by mouth daily., Disp: , Rfl:  .  megestrol (MEGACE) 40 MG/ML suspension, Take by mouth., Disp: , Rfl:  .  meloxicam (MOBIC) 7.5 MG tablet, Take 7.5 mg by mouth daily with supper. , Disp: , Rfl:  .  meloxicam (MOBIC) 7.5 MG tablet, Take 1 tablet by mouth daily., Disp: , Rfl:  .  mirtazapine (REMERON) 30 MG tablet, Take 30 mg by mouth at bedtime., Disp: , Rfl:  .  mirtazapine (REMERON) 45 MG tablet, Take 1 tablet by mouth at bedtime., Disp: , Rfl:  .  mirtazapine (REMERON) 45 MG tablet, , Disp: , Rfl:  .  Multiple Vitamins-Minerals (PRESERVISION AREDS) TABS, Take 1 tablet by mouth 2 (  two) times daily. , Disp: , Rfl:  .  pantoprazole (PROTONIX) 40 MG tablet, Take 40 mg by mouth daily before supper. , Disp: , Rfl: 1 .  pantoprazole (PROTONIX) 40 MG tablet, Take 1 tablet by mouth daily., Disp: , Rfl:  .  Suvorexant (BELSOMRA) 10 MG TABS, Take 1 tablet by mouth daily., Disp: , Rfl:  .  Travoprost, BAK Free, (TRAVATAN) 0.004 % SOLN ophthalmic solution, SMARTSIG:1 Drop(s) In Eye(s) Every Evening, Disp: , Rfl:  .  travoprost, benzalkonium, (TRAVATAN) 0.004 % ophthalmic solution, Place 1 drop into both eyes at bedtime., Disp: , Rfl:  .  vitamin C (ASCORBIC ACID) 500 MG tablet, Take 500 mg by mouth 2 (two) times daily.,  Disp: , Rfl:   Past Medical History: Past Medical History:  Diagnosis Date  . Anemia    IRON INFUSIONS  . Arthritis    osteoarthritis/ RA  . Asthma    remote h/o asthma  . Depression    controlled  . H/O hypoglycemia   . H/O: GI bleed   . Headache(784.0)    h/o migraines  . Hearing loss, bilateral    wears hearing aides  . History of blood transfusion    pt states she had yellow jaundice  . Hypertension    H/O NO TX NOW  . Lupus (systemic lupus erythematosus) ([place]) 1995   in remission since 2005  . Nasal sinus congestion   . Osteoporosis with fracture   . Stress fracture of foot    left foot  . Vitamin B 12 deficiency     Tobacco Use: Social History   Tobacco Use  Smoking Status Never Smoker  Smokeless Tobacco Never Used    Labs: Recent Review Flowsheet Data   There is no flowsheet data to display.      Pulmonary Assessment Scores:  Pulmonary Assessment Scores    Row Name 08/02/20 1609         ADL UCSD   ADL Phase Entry     SOB Score total 17     Rest 0     Walk 1     Stairs 3     Bath 0     Dress 0     Shop 0           CAT Score   CAT Score 8           mMRC Score   mMRC Score 1            UCSD: Self-administered rating of dyspnea associated with activities of daily living (ADLs) 6-point scale (0 = "not at all" to 5 = "maximal or unable to do because of breathlessness")  Scoring Scores range from 0 to 120.  Minimally important difference is 5 units  CAT: CAT can identify the health impairment of COPD patients and is better correlated with disease progression.  CAT has a scoring range of zero to 40. The CAT score is classified into four groups of low (less than 10), medium (10 - 20), high (21-30) and very high (31-40) based on the impact level of disease on health status. A CAT score over 10 suggests significant symptoms.  A worsening CAT score could be explained by an exacerbation, poor medication adherence, poor inhaler technique, or  progression of COPD or comorbid conditions.  CAT MCID is 2 points  mMRC: mMRC (Modified Medical Research Council) Dyspnea Scale is used to assess the degree of baseline functional disability in patients of respiratory disease due to  dyspnea. No minimal important difference is established. A decrease in score of 1 point or greater is considered a positive change.   Pulmonary Function Assessment:  Pulmonary Function Assessment - 07/31/20 0940      Breath   Shortness of Breath Yes;Limiting activity           Exercise Target Goals: Exercise Program Goal: Individual exercise prescription set using results from initial 6 min walk test and THRR while considering  patient's activity barriers and safety.   Exercise Prescription Goal: Initial exercise prescription builds to 30-45 minutes a day of aerobic activity, 2-3 days per week.  Home exercise guidelines will be given to patient during program as part of exercise prescription that the participant will acknowledge.  Education: Aerobic Exercise: - Group verbal and visual presentation on the components of exercise prescription. Introduces F.I.T.T principle from ACSM for exercise prescriptions.  Reviews F.I.T.T. principles of aerobic exercise including progression. Written material given at graduation. Flowsheet Row Pulmonary Rehab from 10/25/2020 in [facility] Cardiac and Pulmonary Rehab  Date 10/25/20  Hilda Blades only on 10/28]  Educator AS  Instruction Review Code 1- Verbalizes Understanding      Education: Resistance Exercise: - Group verbal and visual presentation on the components of exercise prescription. Introduces F.I.T.T principle from ACSM for exercise prescriptions  Reviews F.I.T.T. principles of resistance exercise including progression. Written material given at graduation.    Education: Exercise & Equipment Safety: - Individual verbal instruction and demonstration of equipment use and safety with use of the equipment. Flowsheet  Row Pulmonary Rehab from 10/25/2020 in [facility] Cardiac and Pulmonary Rehab  Date 07/31/20  Educator [facility]  Instruction Review Code 1- Verbalizes Understanding      Education: Exercise Physiology & General Exercise Guidelines: - Group verbal and written instruction with models to review the exercise physiology of the cardiovascular system and associated critical values. Provides general exercise guidelines with specific guidelines to those with heart or lung disease.    Education: Flexibility, Balance, Mind/Body Relaxation: - Group verbal and visual presentation with interactive activity on the components of exercise prescription. Introduces F.I.T.T principle from ACSM for exercise prescriptions. Reviews F.I.T.T. principles of flexibility and balance exercise training including progression. Also discusses the mind body connection.  Reviews various relaxation techniques to help reduce and manage stress (i.e. Deep breathing, progressive muscle relaxation, and visualization). Balance handout provided to take home. Written material given at graduation. Flowsheet Row Pulmonary Rehab from 10/25/2020 in [facility] Cardiac and Pulmonary Rehab  Date 08/30/20  Educator AS  Instruction Review Code 1- Verbalizes Understanding      Activity Barriers & Risk Stratification:  Activity Barriers & Cardiac Risk Stratification - 08/02/20 1620      Activity Barriers & Cardiac Risk Stratification   Activity Barriers Other (comment)    Comments 'Rod" placed in right leg           6 Minute Walk:  6 Minute Walk    Row Name 08/02/20 1612         6 Minute Walk   Phase Initial     Distance 1195 feet     Walk Time 6 minutes     # of Rest Breaks 9     MPH 2.26     METS 2.69     RPE 9     Perceived Dyspnea  0     VO2 Peak 9.44     Symptoms No     Resting HR 85 bpm     Resting BP 132/78  Resting Oxygen Saturation  98 %     Exercise Oxygen Saturation  during 6 min walk 93 %     Max Ex. HR 100 bpm     Max  Ex. BP 158/82     2 Minute Post BP 136/80           Interval HR   1 Minute HR 94     2 Minute HR 95     3 Minute HR 99     4 Minute HR 96     5 Minute HR 97     6 Minute HR 100     2 Minute Post HR 82     Interval Heart Rate? Yes           Interval Oxygen   Interval Oxygen? Yes     Baseline Oxygen Saturation % 98 %     1 Minute Oxygen Saturation % 96 %     1 Minute Liters of Oxygen 0 L  RA     2 Minute Oxygen Saturation % 96 %     2 Minute Liters of Oxygen 0 L     3 Minute Oxygen Saturation % 93 %     3 Minute Liters of Oxygen 0 L     4 Minute Oxygen Saturation % 94 %     4 Minute Liters of Oxygen 0 L     5 Minute Oxygen Saturation % 96 %     5 Minute Liters of Oxygen 0 L     6 Minute Oxygen Saturation % 93 %     6 Minute Liters of Oxygen 0 L     2 Minute Post Oxygen Saturation % 97 %     2 Minute Post Liters of Oxygen 0 L           Oxygen Initial Assessment:  Oxygen Initial Assessment - 08/02/20 1609      Home Oxygen   Home Oxygen Device None    Sleep Oxygen Prescription None    Home Exercise Oxygen Prescription None    Home Resting Oxygen Prescription None      Initial 6 min Walk   Oxygen Used None      Program Oxygen Prescription   Program Oxygen Prescription None      Intervention   Short Term Goals To learn and understand importance of monitoring SPO2 with pulse oximeter and demonstrate accurate use of the pulse oximeter.;To learn and understand importance of maintaining oxygen saturations>88%;To learn and demonstrate proper pursed lip breathing techniques or other breathing techniques.;To learn and demonstrate proper use of respiratory medications    Long  Term Goals Verbalizes importance of monitoring SPO2 with pulse oximeter and return demonstration;Maintenance of O2 saturations>88%;Exhibits proper breathing techniques, such as pursed lip breathing or other method taught during program session;Compliance with respiratory medication;Demonstrates proper use  of MDI's           Oxygen Re-Evaluation:  Oxygen Re-Evaluation    Row Name 08/21/20 0955 09/11/20 0958 10/04/20 1030 10/30/20 1003       Program Oxygen Prescription   Program Oxygen Prescription None None None None         Home Oxygen   Home Oxygen Device None None None None    Sleep Oxygen Prescription None None None None    Home Exercise Oxygen Prescription None None None None    Home Resting Oxygen Prescription None None None None         Goals/Expected Outcomes  Short Term Goals To learn and understand importance of monitoring SPO2 with pulse oximeter and demonstrate accurate use of the pulse oximeter.;To learn and understand importance of maintaining oxygen saturations>88%;To learn and demonstrate proper pursed lip breathing techniques or other breathing techniques.;To learn and demonstrate proper use of respiratory medications To learn and understand importance of monitoring SPO2 with pulse oximeter and demonstrate accurate use of the pulse oximeter.;To learn and understand importance of maintaining oxygen saturations>88%;To learn and demonstrate proper pursed lip breathing techniques or other breathing techniques.;To learn and demonstrate proper use of respiratory medications To learn and understand importance of monitoring SPO2 with pulse oximeter and demonstrate accurate use of the pulse oximeter.;To learn and understand importance of maintaining oxygen saturations>88%;To learn and demonstrate proper pursed lip breathing techniques or other breathing techniques.;To learn and demonstrate proper use of respiratory medications To learn and understand importance of monitoring SPO2 with pulse oximeter and demonstrate accurate use of the pulse oximeter.;To learn and understand importance of maintaining oxygen saturations>88%;To learn and demonstrate proper pursed lip breathing techniques or other breathing techniques.;To learn and demonstrate proper use of respiratory medications     Long  Term Goals Verbalizes importance of monitoring SPO2 with pulse oximeter and return demonstration;Maintenance of O2 saturations>88%;Exhibits proper breathing techniques, such as pursed lip breathing or other method taught during program session;Compliance with respiratory medication;Demonstrates proper use of MDI's Verbalizes importance of monitoring SPO2 with pulse oximeter and return demonstration;Maintenance of O2 saturations>88%;Exhibits proper breathing techniques, such as pursed lip breathing or other method taught during program session;Compliance with respiratory medication;Demonstrates proper use of MDI's Verbalizes importance of monitoring SPO2 with pulse oximeter and return demonstration;Maintenance of O2 saturations>88%;Exhibits proper breathing techniques, such as pursed lip breathing or other method taught during program session;Compliance with respiratory medication;Demonstrates proper use of MDI's Verbalizes importance of monitoring SPO2 with pulse oximeter and return demonstration;Maintenance of O2 saturations>88%;Exhibits proper breathing techniques, such as pursed lip breathing or other method taught during program session;Compliance with respiratory medication;Demonstrates proper use of MDI's    Comments Reviewed PLB technique with pt.  Talked about how it works and it's importance in maintaining their exercise saturations. She is on alberterol and uses the lung exercise equipment. She reports using PLB and feels that is more natural. She hasn't had to use her inhaler and the cold weather has not been affecting her breathing. [place] is doing better with her breathing.  She is using her PLB at home and finds it helpful.  She going to get a pulse oximeter for home use.  She is also using her inspirometer. to help with breathings.  Her meds seem to be doing well for her. She has an inspirometer that she uses at home. She continues with her PLB and reports it helps both with breathing and with  her leg. She reports her breathing has been improving, but is having difficulty with the cold weather. She still does not have a pulse ox, but would like to get one.    Goals/Expected Outcomes Short: Become more profiecient at using PLB.   Long: Become independent at using PLB. Short: continue with lung exercises and practicing PLB.  Long: Become independent at using PLB. Short: Get pulse oximeter for home use  Long; Continue to use PLB. Short: Get pulse oximeter for home use  Long; Continue to use PLB.           Oxygen Discharge (Final Oxygen Re-Evaluation):  Oxygen Re-Evaluation - 10/30/20 1003      Program Oxygen Prescription  Program Oxygen Prescription None      Home Oxygen   Home Oxygen Device None    Sleep Oxygen Prescription None    Home Exercise Oxygen Prescription None    Home Resting Oxygen Prescription None      Goals/Expected Outcomes   Short Term Goals To learn and understand importance of monitoring SPO2 with pulse oximeter and demonstrate accurate use of the pulse oximeter.;To learn and understand importance of maintaining oxygen saturations>88%;To learn and demonstrate proper pursed lip breathing techniques or other breathing techniques.;To learn and demonstrate proper use of respiratory medications    Long  Term Goals Verbalizes importance of monitoring SPO2 with pulse oximeter and return demonstration;Maintenance of O2 saturations>88%;Exhibits proper breathing techniques, such as pursed lip breathing or other method taught during program session;Compliance with respiratory medication;Demonstrates proper use of MDI's    Comments She has an inspirometer that she uses at home. She continues with her PLB and reports it helps both with breathing and with her leg. She reports her breathing has been improving, but is having difficulty with the cold weather. She still does not have a pulse ox, but would like to get one.    Goals/Expected Outcomes Short: Get pulse oximeter for home  use  Long; Continue to use PLB.           Initial Exercise Prescription:  Initial Exercise Prescription - 08/02/20 1600      Date of Initial Exercise RX and Referring Provider   Date 08/02/20    Referring Provider Ottie Glazier MD      Treadmill   MPH 2.3    Grade 0.5    Minutes 15    METs 2.92      Recumbant Bike   Level 2    RPM 60    Watts 10    Minutes 15    METs 2.69      NuStep   Level 3    SPM 80    Minutes 15    METs 2.69      Prescription Details   Frequency (times per week) 2    Duration Progress to 30 minutes of continuous aerobic without signs/symptoms of physical distress      Intensity   THRR 40-80% of Max Heartrate 107-129    Ratings of Perceived Exertion 11-13    Perceived Dyspnea 0-4      Resistance Training   Training Prescription Yes           Perform Capillary Blood Glucose checks as needed.  Exercise Prescription Changes:  Exercise Prescription Changes    Row Name 08/02/20 1600 08/23/20 1100 09/06/20 0700 09/19/20 1200 10/03/20 1000     Response to Exercise   Blood Pressure (Admit) 132/78 142/88 130/76 114/68 118/76   Blood Pressure (Exercise) 158/82 148/64 124/66 140/76 142/80   Blood Pressure (Exit) 130/78 110/60 104/68 106/68 118/72   Heart Rate (Admit) 85 bpm 83 bpm 85 bpm 80 bpm 87 bpm   Heart Rate (Exercise) 100 bpm 100 bpm 97 bpm 107 bpm 101 bpm   Heart Rate (Exit) 82 bpm 80 bpm 85 bpm 95 bpm 87 bpm   Oxygen Saturation (Admit) 98 % 97 % 96 % 98 % 98 %   Oxygen Saturation (Exercise) 93 % 95 % 98 % 96 % 96 %   Oxygen Saturation (Exit) 98 % 99 % 98 % 98 % 97 %   Rating of Perceived Exertion (Exercise) 9 11 11 11 11    Perceived Dyspnea (Exercise) 0  1 0 0 0   Symptoms none - - - -   Comments walk test results first day none none none   Duration Progress to 30 minutes of  aerobic without signs/symptoms of physical distress - Progress to 30 minutes of  aerobic without signs/symptoms of physical distress Continue with 30 min of  aerobic exercise without signs/symptoms of physical distress. Continue with 30 min of aerobic exercise without signs/symptoms of physical distress.   Intensity - - THRR unchanged THRR unchanged THRR unchanged     Progression   Progression - - Continue to progress workloads to maintain intensity without signs/symptoms of physical distress. Continue to progress workloads to maintain intensity without signs/symptoms of physical distress. Continue to progress workloads to maintain intensity without signs/symptoms of physical distress.   Average METs - - 2.2 2 2.05     Resistance Training   Training Prescription - Yes Yes Yes Yes   Weight 3 lb 1 lb 1 lb 1 lb 1 lb   Reps 10-15 10-15 10-15 10-15 10-15     Interval Training   Interval Training - - No No No     Treadmill   MPH - 0.7 1.2 1.2 1.8   Grade - 0 0.5 0.5 0.5   Minutes - 15 15 15 15    METs - 1.54 2 2 2.5     Recumbant Bike   Level - - 2 - -   Minutes - - 15 - -     NuStep   Level - 3 3 3 3    SPM - 80 - 80 -   Minutes - 15 15 15 15    METs - 1.2 2.4 1.9 1.6   Row Name 10/30/20 1600             Response to Exercise   Blood Pressure (Admit) 128/72       Blood Pressure (Exercise) 128/60       Blood Pressure (Exit) 112/62       Heart Rate (Admit) 87 bpm       Heart Rate (Exercise) 97 bpm       Heart Rate (Exit) 83 bpm       Oxygen Saturation (Admit) 98 %       Oxygen Saturation (Exercise) 93 %       Oxygen Saturation (Exit) 98 %       Rating of Perceived Exertion (Exercise) 15       Perceived Dyspnea (Exercise) 0       Comments none       Duration Continue with 30 min of aerobic exercise without signs/symptoms of physical distress.       Intensity THRR unchanged               Progression   Progression Continue to progress workloads to maintain intensity without signs/symptoms of physical distress.       Average METs 2               Resistance Training   Training Prescription Yes       Weight 1 lb       Reps  10-15               Treadmill   MPH 1.2       Grade 0.5       Minutes 15       METs 2               Recumbant Bike   Level  2       Minutes 15               NuStep   Level 3       Minutes 15       METs 2               Home Exercise Plan   Plans to continue exercise at Home (comment)  walking and staff videos       Frequency Add 2 additional days to program exercise sessions.       Initial Home Exercises Provided 10/02/20              Exercise Comments:  Exercise Comments    Row Name 10/08/20 1342 10/24/20 0606         Exercise Comments Tasharia called to let us know that she saw her doctor for her sciatica pain and he felt that when she tried the 3 lb weights, she may have pulled something and encouraged her to rest a week.  We also encouraged her to stretch and ice (if able) to help as well.  She will keep [place] posted on next week. Returned to program yesterday.  comcerned about her spinal stenosis and exercising without aggravating sciatica. Got her started back slowly with instruction to stop as needed.             Exercise Goals and Review:  Exercise Goals    Row Name 08/02/20 1630             Exercise Goals   Increase Physical Activity Yes       Intervention Provide advice, education, support and counseling about physical activity/exercise needs.;Develop an individualized exercise prescription for aerobic and resistive training based on initial evaluation findings, risk stratification, comorbidities and participant's personal goals.       Expected Outcomes Short Term: Attend rehab on a regular basis to increase amount of physical activity.;Long Term: Add in home exercise to make exercise part of routine and to increase amount of physical activity.;Long Term: Exercising regularly at least 3-5 days a week.       Increase Strength and Stamina Yes       Intervention Provide advice, education, support and counseling about physical activity/exercise needs.;Develop an  individualized exercise prescription for aerobic and resistive training based on initial evaluation findings, risk stratification, comorbidities and participant's personal goals.       Expected Outcomes Short Term: Increase workloads from initial exercise prescription for resistance, speed, and METs.;Short Term: Perform resistance training exercises routinely during rehab and add in resistance training at home;Long Term: Improve cardiorespiratory fitness, muscular endurance and strength as measured by increased METs and functional capacity (6MWT)       Able to understand and use rate of perceived exertion (RPE) scale Yes       Intervention Provide education and explanation on how to use RPE scale       Expected Outcomes Short Term: Able to use RPE daily in rehab to express subjective intensity level;Long Term:  Able to use RPE to guide intensity level when exercising independently       Able to understand and use Dyspnea scale Yes       Intervention Provide education and explanation on how to use Dyspnea scale       Expected Outcomes Short Term: Able to use Dyspnea scale daily in rehab to express subjective sense of shortness of breath during exertion;Long Term: Able to use Dyspnea scale to guide intensity  level when exercising independently       Knowledge and understanding of Target Heart Rate Range (THRR) Yes       Intervention Provide education and explanation of THRR including how the numbers were predicted and where they are located for reference       Expected Outcomes Short Term: Able to state/look up THRR;Short Term: Able to use daily as guideline for intensity in rehab;Long Term: Able to use THRR to govern intensity when exercising independently       Able to check pulse independently Yes       Intervention Provide education and demonstration on how to check pulse in carotid and radial arteries.;Review the importance of being able to check your own pulse for safety during independent exercise        Expected Outcomes Short Term: Able to explain why pulse checking is important during independent exercise;Long Term: Able to check pulse independently and accurately       Understanding of Exercise Prescription Yes       Intervention Provide education, explanation, and written materials on patient's individual exercise prescription       Expected Outcomes Short Term: Able to explain program exercise prescription;Long Term: Able to explain home exercise prescription to exercise independently              Exercise Goals Re-Evaluation :  Exercise Goals Re-Evaluation    [place] Name 08/21/20 0955 09/06/20 0736 09/11/20 1001 09/19/20 1228 10/03/20 1013     Exercise Goal Re-Evaluation   Exercise Goals Review Increase Physical Activity;Able to understand and use rate of perceived exertion (RPE) scale;Knowledge and understanding of Target Heart Rate Range (THRR);Understanding of Exercise Prescription;Increase Strength and Stamina;Able to check pulse independently;Able to understand and use Dyspnea scale Increase Physical Activity;Increase Strength and Stamina;Understanding of Exercise Prescription Increase Physical Activity;Increase Strength and Stamina;Understanding of Exercise Prescription Increase Physical Activity;Increase Strength and Stamina;Understanding of Exercise Prescription Increase Physical Activity;Increase Strength and Stamina;Understanding of Exercise Prescription   Comments Reviewed RPE and dyspnea scales, THR and program prescription with pt today.  Pt voiced understanding and was given a copy of goals to take home. Seven is off to a good start in rehab.  She is already up to level 3 on the NuStep.  We will continue to monitor her progress. She walks her dog and goes to the park - 20-30 minutes (3x/week). Can't walk in her neighborhood due to large dogs. She does not do any weighted exercises at home. She has not reviewed home exercise yet. Eleanors oxygen levels are in 90s during  exercise.  She reoprts RPE of 11.  She reaches THR range sometimes.  Staff will encourage increasing levels to reach THR range and review home exercise. Alya is doing well in rehab.  She is already up to level 3 on the NuStep.  We will continue to monitor her progress.   Expected Outcomes Short: Use RPE daily to regulate intensity. Long: Follow program prescription in THR. Short: Continue to attend class regularly Long: Continue to follow program prescription ST: meet with EP regarding home exercise LT: continue attending pulmonary rehab consistently Short: review home exercise and THR range Long: build overall stamina Short: try 2 lb weights Long: Continue to improve stamina.   [place] Name 10/04/20 1013 10/30/20 1001           Exercise Goal Re-Evaluation   Exercise Goals Review Increase Physical Activity;Understanding of Exercise Prescription;Increase Strength and Stamina;Able to understand and use rate of perceived exertion (RPE) scale;Able  to understand and use Dyspnea scale;Knowledge and understanding of Target Heart Rate Range (THRR);Able to check pulse independently Increase Physical Activity;Understanding of Exercise Prescription;Increase Strength and Stamina;Able to understand and use rate of perceived exertion (RPE) scale;Able to understand and use Dyspnea scale;Knowledge and understanding of Target Heart Rate Range (THRR);Able to check pulse independently      Comments Elenor is doing well in rehab.  Her leg has been bothering her and she has an appointment tomorrow to look at her rod again.  She continues to chase her dog and cat each to stay active at home. Reviewed home exercise with pt today.  Pt plans to walk and use staff videos for exercise.  Reviewed THR, pulse, RPE, sign and symptoms, pulse oximetery and when to call 911 or MD.  Also discussed weather considerations and indoor options.  Pt voiced understanding. Etha is back from being out due to leg pain and is doing well. She  continues to stay active with her animals at home. She hasn't tried our workout videos yet due to her leg, she has chores at home she needs to do , but hasn't with leg. she walks her dog in the park when she can.      Expected Outcomes Short: Start to add in more exercise at home Long: Continue to improve stamina. Short: Start to add in more exercise at home as tolerated with leg pain  Long: Continue to improve stamina.             Discharge Exercise Prescription (Final Exercise Prescription Changes):  Exercise Prescription Changes - 10/30/20 1600      Response to Exercise   Blood Pressure (Admit) 128/72    Blood Pressure (Exercise) 128/60    Blood Pressure (Exit) 112/62    Heart Rate (Admit) 87 bpm    Heart Rate (Exercise) 97 bpm    Heart Rate (Exit) 83 bpm    Oxygen Saturation (Admit) 98 %    Oxygen Saturation (Exercise) 93 %    Oxygen Saturation (Exit) 98 %    Rating of Perceived Exertion (Exercise) 15    Perceived Dyspnea (Exercise) 0    Comments none    Duration Continue with 30 min of aerobic exercise without signs/symptoms of physical distress.    Intensity THRR unchanged      Progression   Progression Continue to progress workloads to maintain intensity without signs/symptoms of physical distress.    Average METs 2      Resistance Training   Training Prescription Yes    Weight 1 lb    Reps 10-15      Treadmill   MPH 1.2    Grade 0.5    Minutes 15    METs 2      Recumbant Bike   Level 2    Minutes 15      NuStep   Level 3    Minutes 15    METs 2      Home Exercise Plan   Plans to continue exercise at Home (comment)   walking and staff videos   Frequency Add 2 additional days to program exercise sessions.    Initial Home Exercises Provided 10/02/20           Nutrition:  Target Goals: Understanding of nutrition guidelines, daily intake of sodium 1500mg , cholesterol 200mg , calories 30% from fat and 7% or less from saturated fats, daily to have 5 or  more servings of fruits and vegetables.  Education: All About Nutrition: -Group  instruction provided by verbal, written material, interactive activities, discussions, models, and posters to present general guidelines for heart healthy nutrition including fat, fiber, MyPlate, the role of sodium in heart healthy nutrition, utilization of the nutrition label, and utilization of this knowledge for meal planning. Follow up email sent as well. Written material given at graduation. Flowsheet Row Pulmonary Rehab from 10/25/2020 in [facility]  Date 09/06/20  Educator [facility]  Instruction Review Code 1- Verbalizes Understanding      Biometrics:    Nutrition Therapy Plan and Nutrition Goals:  Nutrition Therapy & Goals - 09/11/20 0947      Nutrition Therapy   Diet Pulmonary MNT, high kcal, high protien    Protein (specify units) 57g    Fiber 25 grams    Whole Grain Foods 3 servings    Saturated Fats 12 max. grams    Fruits and Vegetables 5 servings/day    Sodium 1.5 grams      Personal Nutrition Goals   Nutrition Goal ST: add boost 2x/day, add fat and protein to meals (mashed potatoes with butter and cream with beans for a snack, nuts or nut butter and milk to oatmeal for example) LT: gain weight - 1st goal 100 lbs.    Comments Have not completed initial evaluation, however, did do one on one education with patient. She has continuously been losing weight and is at 88 pounds. She also reports needing more iron. Discussed heart healthy eating, high calorie and high protein while still eating heart healthy. She is very interested in power bowls and loves fruit and vegetables. 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 She doesn't normally eat breakfast. She eats whatever she feels like at that time. She loves pasta - she is having altered taste. She reports having small meals. She reports eating a lot of eggs and cheese. 2 meals she will have a good protein source.  She adds fats to her meals like bacon, olive oil, butter. She reports loving potatoes. She has about a fistful of food per meal. She reports getting a medication to help with appetite - her appetite has improved some. Discussed adding in boost 2x/day. Discussed heart healthy eating and MNT to gain weight. Small frequent meals, boost, protein at all meals, added fat to meals.      Intervention Plan   Intervention Prescribe, educate and counsel regarding individualized specific dietary modifications aiming towards targeted core components such as weight, hypertension, lipid management, diabetes, heart failure and other comorbidities.;Nutrition handout(s) given to patient.    Expected Outcomes Short Term Goal: Understand basic principles of dietary content, such as calories, fat, sodium, cholesterol and nutrients.;Short Term Goal: A plan has been developed with personal nutrition goals set during dietitian appointment.;Long Term Goal: Adherence to prescribed nutrition plan.           Nutrition Assessments:  Nutrition Assessments - 08/02/20 1609      MEDFICTS Scores   Pre Score 66          MEDIFICTS Score Key:  ?70 Need to make dietary changes   40-70 Heart Healthy Diet  ? 40 Therapeutic Level Cholesterol Diet   Picture Your Plate Scores:  [id number] Unhealthy dietary pattern with much room for improvement.  41-50 Dietary pattern unlikely to meet recommendations for good health and room for improvement.  51-60 More healthful dietary pattern, with some room for improvement.   >60 Healthy dietary pattern, although there may be some specific behaviors that could  be improved.   Nutrition Goals Re-Evaluation:  Nutrition Goals Re-Evaluation    [place] Name 10/04/20 1026 10/30/20 0951           Goals   Nutrition Goal ST: add boost 2x/day, add fat and protein to meals (mashed potatoes with butter and cream with beans for a snack, nuts or nut butter and milk to oatmeal for example) LT: gain  weight - 1st goal 100 lbs. ST: add boost 2x/day, add fat and protein to meals (mashed potatoes with butter and cream with beans for a snack, nuts or nut butter and milk to oatmeal for example) LT: gain weight - 1st goal 100 lbs.      Comment Akiesha has not been gaining any weight. She is taking the magesterol, but her appetite is lacking this week. She is using the boost daily now.  The ensure upsets her stomach, but boost is doing well.  She is trying to add in the fat and protein, but still hard to cook for one.  We talked about freezing some to help out. Denasha has lost more weight, sent note to her doctor suggesting referral to RD who can follow he rmore closely and prescribe supplemental nutrition to help with oral intake. Naijah reports her appetite is not good even with the megace - she reports having a doctors appointment and will speak to MD regarding medication and weight. Reiterated high kcal and high protein MNT.      Expected Outcome Short: Try cooking and freezing meals  and continue with boost Long: Continue work on weight gain ST: add boost 2x/day, add fat and protein to meals (mashed potatoes with butter and cream with beans for a snack, nuts or nut butter and milk to oatmeal for example) LT: gain weight - 1st goal 100 lbs.             Nutrition Goals Discharge (Final Nutrition Goals Re-Evaluation):  Nutrition Goals Re-Evaluation - 10/30/20 0951      Goals   Nutrition Goal ST: add boost 2x/day, add fat and protein to meals (mashed potatoes with butter and cream with beans for a snack, nuts or nut butter and milk to oatmeal for example) LT: gain weight - 1st goal 100 lbs.    Comment Kaylanni has lost more weight, sent note to her doctor suggesting referral to RD who can follow he rmore closely and prescribe supplemental nutrition to help with oral intake. Annisten reports her appetite is not good even with the megace - she reports having a doctors appointment and will speak to MD  regarding medication and weight. Reiterated high kcal and high protein MNT.    Expected Outcome ST: add boost 2x/day, add fat and protein to meals (mashed potatoes with butter and cream with beans for a snack, nuts or nut butter and milk to oatmeal for example) LT: gain weight - 1st goal 100 lbs.           Psychosocial: Target Goals: Acknowledge presence or absence of significant depression and/or stress, maximize coping skills, provide positive support system. Participant is able to verbalize types and ability to use techniques and skills needed for reducing stress and depression.   Education: Stress, Anxiety, and Depression - Group verbal and visual presentation to define topics covered.  Reviews how body is impacted by stress, anxiety, and depression.  Also discusses healthy ways to reduce stress and to treat/manage anxiety and depression.  Written material given at graduation. Flowsheet Row Pulmonary Rehab from 10/25/2020 in  [place] [facility]  Date 10/04/20  Educator SB  Instruction Review Code 1- [employer] Understanding      Education: Sleep Hygiene -Provides group verbal and written instruction about how sleep can affect your health.  Define sleep hygiene, discuss sleep cycles and impact of sleep habits. Review good sleep hygiene tips.    Initial Review & Psychosocial Screening:  Initial Psych Review & Screening - 07/31/20 0943      Initial Review   Current issues with Current Psychotropic Meds;Current Stress Concerns    Comments Her back bothers her and is wanting to do more exercise to help with mood.      Family Dynamics   Good Support System? Yes    Comments She can look to her cousin and friend for support.      Barriers   Psychosocial barriers to participate in program The patient should benefit from training in stress management and relaxation.      Screening Interventions   Interventions Encouraged to exercise;To provide support and resources with  identified psychosocial needs;Provide feedback about the scores to participant    Expected Outcomes Short Term goal: Utilizing psychosocial counselor, staff and physician to assist with identification of specific Stressors or current issues interfering with healing process. Setting desired goal for each stressor or current issue identified.;Long Term Goal: Stressors or current issues are controlled or eliminated.;Short Term goal: Identification and review with participant of any Quality of Life or Depression concerns found by scoring the questionnaire.;Long Term goal: The participant improves quality of Life and PHQ9 Scores as seen by post scores and/or verbalization of changes           Quality of Life Scores:  Scores of 19 and below usually indicate a poorer quality of life in these areas.  A difference of  2-3 points is a clinically meaningful difference.  A difference of 2-3 points in the total score of the Quality of Life Index has been associated with significant improvement in overall quality of life, self-image, physical symptoms, and general health in studies assessing change in quality of life.  PHQ-9: Recent Review Flowsheet Data    Depression screen [facility] 2/9 08/02/2020   Decreased Interest 0   Down, Depressed, Hopeless 0   PHQ - 2 Score 0   Altered sleeping 0   Tired, decreased energy 1   Change in appetite 1   Feeling bad or failure about yourself  0   Trouble concentrating 0   Moving slowly or fidgety/restless 0   Suicidal thoughts 0   PHQ-9 Score 2   Difficult doing work/chores Not difficult at all     Interpretation of Total Score  Total Score Depression Severity:  1-4 = Minimal depression, 5-9 = Mild depression, 10-14 = Moderate depression, 15-19 = Moderately severe depression, 20-27 = Severe depression   Psychosocial Evaluation and Intervention:  Psychosocial Evaluation - 07/31/20 0945      Psychosocial Evaluation & Interventions   Interventions Encouraged to  exercise with the program and follow exercise prescription;Relaxation education;Stress management education    Comments She can look to her cousin and friend for support. She states that with everything that is going on with COVID is stressful.    Expected Outcomes Short: Exercise regularly to support mental health and notify staff of any changes. Long: maintain mental health and well being through teaching of rehab or prescribed medications independently.    Continue Psychosocial Services  Follow up required by staff  Psychosocial Re-Evaluation:  Psychosocial Re-Evaluation    [place] Name 09/11/20 1004 10/04/20 1024 10/30/20 0958         Psychosocial Re-Evaluation   Current issues with Current Stress Concerns;Current Psychotropic Meds Current Stress Concerns;Current Psychotropic Meds Current Psychotropic Meds     Comments She has recently had a death in her family and is going to the funeral this Thursday. She has some stress regarding the current state of the world. She is on an anti-anxiety medication after a bad divorce and reports doing well on it. She rpeorts having a good support system - 94 year old friend who she speaks to daily, cousin was a good support system - passed recently - his children are supporting her, church supports her as well. She is on a sleep medication which has been helping - she was never a good sleeper - she sleeps at 11pm and wakes up to her dog at 7am. She reads to help manage stress and croquets. Elenor is doing well in rehab. She tries not to let things bother her as much, she does not have as much family left.  She has a couple of cousins left that support her and her chuch family.  She continues to sleep good.  She continues to do well on her meds. She reports no current stress concerns, she is still taking her medication as directed and it is still helping. She reports her sleep has been getting better with new medicaiton - she is getting at least 6-7 hours  of sleep until her dog wakes her up.     Expected Outcomes ST: Use her support system during this time of grieving LT: continue to use realxing activities and take medication as directed. short: Continue to lean on family as needed  Long: Continue to make time to rest and recover. short: Continue to lean on family as needed  Long: Continue to make time to rest and recover.     Interventions Encouraged to attend Pulmonary Rehabilitation for the exercise Encouraged to attend Pulmonary Rehabilitation for the exercise Encouraged to attend Pulmonary Rehabilitation for the exercise     Continue Psychosocial Services  Follow up required by staff - Follow up required by staff     Comments She has recently had a death in her family and is going to the funeral this Thursday. She has some stress regarding the current state of the world. She is on an anti-anxiety medication after a bad divorce and reports doing well on it. She rpeorts having a good support system - 67 year old friend who she speaks to daily, cousin was a good support system - passed recently - his children are supporting her, church supports her as well. She is on a sleep medication which has been helping - she was never a good sleeper - she sleeps at 11pm and wakes up to her dog at 7am. She reads to help manage stress and croquets. - -           Initial Review   Source of Stress Concerns Family - -            Psychosocial Discharge (Final Psychosocial Re-Evaluation):  Psychosocial Re-Evaluation - 10/30/20 0958      Psychosocial Re-Evaluation   Current issues with Current Psychotropic Meds    Comments She reports no current stress concerns, she is still taking her medication as directed and it is still helping. She reports her sleep has been getting better with new medicaiton - she  is getting at least 6-7 hours of sleep until her dog wakes her up.    Expected Outcomes short: Continue to lean on family as needed  Long: Continue to make time  to rest and recover.    Interventions Encouraged to attend Pulmonary Rehabilitation for the exercise    Continue Psychosocial Services  Follow up required by staff           Education: Education Goals: Education classes will be provided on a weekly basis, covering required topics. Participant will state understanding/return demonstration of topics presented.  Learning Barriers/Preferences:  Learning Barriers/Preferences - 07/31/20 0941      Learning Barriers/Preferences   Learning Barriers Hearing    Learning Preferences None           General Pulmonary Education Topics:  Infection Prevention: - Provides verbal and written material to individual with discussion of infection control including proper hand washing and proper equipment cleaning during exercise session. Flowsheet Row Pulmonary Rehab from 10/25/2020 in [facility] Cardiac and Pulmonary Rehab  Date 07/31/20  Educator [facility]  Instruction Review Code 1- Verbalizes Understanding      Falls Prevention: - Provides verbal and written material to individual with discussion of falls prevention and safety. Flowsheet Row Pulmonary Rehab from 10/25/2020 in [facility] Card[facility] and Pulmonary Rehab  Date 07/31/20  Educator [facility]  Instruction Review Code 1- Verbalizes Understanding      Chronic Lung Disease Review: - Group verbal instruction with posters, models, PowerPoint presentations and videos,  to review new updates, new respiratory medications, new advancements in procedures and treatments. Providing information on websites and "800" numbers for continued self-education. Includes information about supplement oxygen, available portable oxygen systems, continuous and intermittent flow rates, oxygen safety, concentrators, and Medicare reimbursement for oxygen. Explanation of Pulmonary Drugs, including class, frequency, complications, importance of spacers, rinsing mouth after steroid MDI's, and proper cleaning methods for nebulizers. Review of  basic lung anatomy and physiology related to function, structure, and complications of lung disease. Review of risk factors. Discussion about methods for diagnosing sleep apnea and types of masks and machines for OSA. Includes a review of the use of types of environmental controls: home humidity, furnaces, filters, dust mite/pet prevention, HEPA vacuums. Discussion about weather changes, air quality and the benefits of nasal washing. Instruction on Warning signs, infection symptoms, calling MD promptly, preventive modes, and value of vaccinations. Review of effective airway clearance, coughing and/or vibration techniques. Emphasizing that all should Create an Action Plan. Written material given at graduation. Flowsheet Row Pulmonary Rehab from 10/25/2020 in [facility] Cardiac and Pulmonary Rehab  Date 09/27/20  Educator jh  Instruction Review Code 1- Verbalizes Understanding      AED/CPR: - Group verbal and written instruction with the use of models to demonstrate the basic use of the AED with the basic ABC's of resuscitation.    Anatomy and Cardiac Procedures: - Group verbal and visual presentation and models provide information about basic cardiac anatomy and function. Reviews the testing methods done to diagnose heart disease and the outcomes of the test results. Describes the treatment choices: Medical Management, Angioplasty, or Coronary Bypass Surgery for treating various heart conditions including Myocardial Infarction, Angina, Valve Disease, and Cardiac Arrhythmias.  Written material given at graduation. Flowsheet Row Pulmonary Rehab from 10/25/2020 in [facility] Cardiac and Pulmonary Rehab  Date 08/23/20  Educator SB  Instruction Review Code 1- Verbalizes Understanding      Medication Safety: - Group verbal and visual instruction to review commonly prescribed medications for heart and  lung disease. Reviews the medication, class of the drug, and side effects. Includes the steps to properly store  meds and maintain the prescription regimen.  Written material given at graduation.   Other: -Provides group and verbal instruction on various topics (see comments)   Knowledge Questionnaire Score:  Knowledge Questionnaire Score - 08/02/20 1611      Knowledge Questionnaire Score   Pre Score 14/18: Oxygen, ADLs,            Core Components/Risk Factors/Patient Goals at Admission:  Personal Goals and Risk Factors at Admission - 08/02/20 1631      Core Components/Risk Factors/Patient Goals on Admission    Weight Management Yes;Weight Maintenance    Intervention Weight Management: Provide education and appropriate resources to help participant work on and attain dietary goals.;Weight Management/Obesity: Establish reasonable short term and long term weight goals.;Weight Management: Develop a combined nutrition and exercise program designed to reach desired caloric intake, while maintaining appropriate intake of nutrient and fiber, sodium and fats, and appropriate energy expenditure required for the weight goal.    Admit Weight 92 lb 11.2 oz (42 kg)    Goal Weight: Short Term 92 lb 11.2 oz (42 kg)    Goal Weight: Long Term 92 lb 11.2 oz (42 kg)    Expected Outcomes Short Term: Continue to assess and modify interventions until short term weight is achieved;Long Term: Adherence to nutrition and physical activity/exercise program aimed toward attainment of established weight goal;Weight Maintenance: Understanding of the daily nutrition guidelines, which includes 25-35% calories from fat, 7% or less cal from saturated fats, less than 200mg  cholesterol, less than 1.5gm of sodium, & 5 or more servings of fruits and vegetables daily;Understanding recommendations for meals to include 15-35% energy as protein, 25-35% energy from fat, 35-60% energy from carbohydrates, less than 200mg  of dietary cholesterol, 20-35 gm of total fiber daily;Understanding of distribution of calorie intake throughout the day with  the consumption of 4-5 meals/snacks    Improve shortness of breath with ADL's Yes    Intervention Provide education, individualized exercise plan and daily activity instruction to help decrease symptoms of SOB with activities of daily living.    Expected Outcomes Short Term: Improve cardiorespiratory fitness to achieve a reduction of symptoms when performing ADLs;Long Term: Be able to perform more ADLs without symptoms or delay the onset of symptoms    Hypertension Yes    Intervention Provide education on lifestyle modifcations including regular physical activity/exercise, weight management, moderate sodium restriction and increased consumption of fresh fruit, vegetables, and low fat dairy, alcohol moderation, and smoking cessation.;Monitor prescription use compliance.    Expected Outcomes Short Term: Continued assessment and intervention until BP is < 140/75mm HG in hypertensive participants. < 130/72mm HG in hypertensive participants with diabetes, heart failure or chronic kidney disease.;Long Term: Maintenance of blood pressure at goal levels.           Education:Diabetes - Individual verbal and written instruction to review signs/symptoms of diabetes, desired ranges of glucose level fasting, after meals and with exercise. Acknowledge that pre and post exercise glucose checks will be done for 3 sessions at entry of program.   Know Your Numbers and Heart Failure: - Group verbal and visual instruction to discuss disease risk factors for cardiac and pulmonary disease and treatment options.  Reviews associated critical values for Overweight/Obesity, Hypertension, Cholesterol, and Diabetes.  Discusses basics of heart failure: signs/symptoms and treatments.  Introduces Heart Failure Zone chart for action plan for heart failure.  Written material  given at graduation. Flowsheet Row Pulmonary Rehab from 10/25/2020 in [facility] Cardiac and Pulmonary Rehab  Date 09/18/20  Educator [facility]  Instruction Review Code  1- Verbalizes Understanding      Core Components/Risk Factors/Patient Goals Review:   Goals and Risk Factor Review    Row Name 09/11/20 1011 10/04/20 1029 10/30/20 1006         Core Components/Risk Factors/Patient Goals Review   Personal Goals Review Improve shortness of breath with ADL's;Develop more efficient breathing techniques such as purse lipped breathing and diaphragmatic breathing and practicing self-pacing with activity. Improve shortness of breath with ADL's;Weight Management/Obesity Improve shortness of breath with ADL's;Weight Management/Obesity     Review She reports her shortness of breath has gotten better since her pnemonia - practices PLB and breathing exercises. She is working on self pacing during class - will talk about home exercise with EP. She is not on any BP medication - she does not have any hypertension at this time. Resting BP today 116/58. Vinessa's weight is up a little.  She is trying to get back to 93 lb, but her eating is not where she wants it to be.  This week her appetite is not where she wants it to be. Her breathing is getting better overall and she is able to do more at home. Pressures continue to do well. Carrieann's weight is down again at 84 pounds.  She is trying to get back to 93 lb, but her eating is not where she wants it to be, her appetite is worse. Reviewed high kcal and high protein nutrition with Barnett Applebaum and she will address it with her doctor this week. I suggested she see an RD who can follow her more closely and who can prescibe supplemental feedings to her oral intake.  Her breathing is getting better overall and she is able to do more at home.     Expected Outcomes ST: Continue to practice breathing techniques LT: talk to EP about home exercise Short: Continue to work on weight gain Long; Continue to monitor risk factors. Short: Continue to work on weight gain Long; Continue to monitor risk factors.            Core Components/Risk  Factors/Patient Goals at Discharge (Final Review):   Goals and Risk Factor Review - 10/30/20 1006      Core Components/Risk Factors/Patient Goals Review   Personal Goals Review Improve shortness of breath with ADL's;Weight Management/Obesity    Review Christell's weight is down again at 84 pounds.  She is trying to get back to 93 lb, but her eating is not where she wants it to be, her appetite is worse. Reviewed high kcal and high protein nutrition with Barnett Applebaum and she will address it with her doctor this week. I suggested she see an RD who can follow her more closely and who can prescibe supplemental feedings to her oral intake.  Her breathing is getting better overall and she is able to do more at home.    Expected Outcomes Short: Continue to work on weight gain Long; Continue to monitor risk factors.           ITP Comments:  ITP Comments    Row Name 07/31/20 0950 08/02/20 1607 08/21/20 0954 08/29/20 0657 09/26/20 1021   ITP Comments Virtual Visit completed. Patient informed on EP and RD appointment and 6 Minute walk test. Patient also informed of patient health questionnaires on My Chart. Patient Verbalizes understanding. Visit diagnosis can be found in [facility]  07/26/2020. Completed 6MWT and gym orientation. Initial ITP created and sent for review to Dr. Emily Filbert, Medical Director. First full day of exercise!  Patient was oriented to gym and equipment including functions, settings, policies, and procedures.  Patient's individual exercise prescription and treatment plan were reviewed.  All starting workloads were established based on the results of the 6 minute walk test done at initial orientation visit.  The plan for exercise progression was also introduced and progression will be customized based on patient's performance and goals. 30 Day review completed. Medical Director ITP review done, changes made as directed, and signed approval by Medical Director. 30 Day review completed. Medical Director ITP  review done, changes made as directed, and signed approval by Medical Director.   [place] Name 10/08/20 1342 10/17/20 1248 10/24/20 0604 11/01/20 1029 11/21/20 0927   ITP Comments Barnett Applebaum called to let us know that she saw her doctor for her sciatica pain and he felt that when she tried the 3 lb weights, she may have pulled something and encouraged her to rest a week.  We also encouraged her to stretch and ice (if able) to help as well.  She will keep [place] posted on next week. Kaidyn has been out since last review. 30 Day review completed. Medical Director ITP review done, changes made as directed, and signed approval by Medical Director. Returned to program yesterday.  comcerned about her spinal stenosis and exercising without aggravating sciatica. Got her started back slowly with instruction to stop as needed. Journie came in today after her MD appointment to let us know that she was advised to drop from the program to focus on weight loss. Genece reports that the MD read my note with her and that he will be sending her to an RD. He is keeping her on Megace to help with her appetite. We will put her on a medical hold for now and see how her weight is improving to see when we can bring her back; we will reassess in a couple of months, told her to keep [place] updated. 30 Day review completed. Medical Director ITP review done, changes made as directed, and signed approval by Medical Director.          Comments:

## 2020-11-22 DIAGNOSIS — M5136 Other intervertebral disc degeneration, lumbar region: Secondary | ICD-10-CM | POA: Diagnosis not present

## 2020-11-22 DIAGNOSIS — M5416 Radiculopathy, lumbar region: Secondary | ICD-10-CM | POA: Diagnosis not present

## 2020-11-22 DIAGNOSIS — M48062 Spinal stenosis, lumbar region with neurogenic claudication: Secondary | ICD-10-CM | POA: Diagnosis not present

## 2020-11-22 DIAGNOSIS — H353131 Nonexudative age-related macular degeneration, bilateral, early dry stage: Secondary | ICD-10-CM | POA: Diagnosis not present

## 2020-11-26 ENCOUNTER — Encounter: Payer: Self-pay | Admitting: *Deleted

## 2020-11-26 DIAGNOSIS — J45909 Unspecified asthma, uncomplicated: Secondary | ICD-10-CM

## 2020-12-07 DIAGNOSIS — E538 Deficiency of other specified B group vitamins: Secondary | ICD-10-CM | POA: Diagnosis not present

## 2020-12-10 ENCOUNTER — Other Ambulatory Visit: Payer: Self-pay | Admitting: Family Medicine

## 2020-12-10 DIAGNOSIS — Z1231 Encounter for screening mammogram for malignant neoplasm of breast: Secondary | ICD-10-CM

## 2020-12-12 ENCOUNTER — Other Ambulatory Visit: Payer: Self-pay

## 2020-12-12 ENCOUNTER — Inpatient Hospital Stay: Payer: Medicare PPO | Attending: Internal Medicine

## 2020-12-12 DIAGNOSIS — I10 Essential (primary) hypertension: Secondary | ICD-10-CM | POA: Diagnosis not present

## 2020-12-12 DIAGNOSIS — J45909 Unspecified asthma, uncomplicated: Secondary | ICD-10-CM | POA: Insufficient documentation

## 2020-12-12 DIAGNOSIS — K9589 Other complications of other bariatric procedure: Secondary | ICD-10-CM

## 2020-12-12 DIAGNOSIS — F32A Depression, unspecified: Secondary | ICD-10-CM | POA: Insufficient documentation

## 2020-12-12 DIAGNOSIS — D508 Other iron deficiency anemias: Secondary | ICD-10-CM

## 2020-12-12 DIAGNOSIS — E538 Deficiency of other specified B group vitamins: Secondary | ICD-10-CM | POA: Diagnosis not present

## 2020-12-12 DIAGNOSIS — D509 Iron deficiency anemia, unspecified: Secondary | ICD-10-CM | POA: Diagnosis not present

## 2020-12-12 DIAGNOSIS — Z7982 Long term (current) use of aspirin: Secondary | ICD-10-CM | POA: Diagnosis not present

## 2020-12-12 DIAGNOSIS — M329 Systemic lupus erythematosus, unspecified: Secondary | ICD-10-CM | POA: Insufficient documentation

## 2020-12-12 DIAGNOSIS — R197 Diarrhea, unspecified: Secondary | ICD-10-CM | POA: Diagnosis not present

## 2020-12-12 DIAGNOSIS — R634 Abnormal weight loss: Secondary | ICD-10-CM | POA: Diagnosis not present

## 2020-12-12 DIAGNOSIS — Z903 Acquired absence of stomach [part of]: Secondary | ICD-10-CM | POA: Insufficient documentation

## 2020-12-12 DIAGNOSIS — Z79899 Other long term (current) drug therapy: Secondary | ICD-10-CM | POA: Diagnosis not present

## 2020-12-12 LAB — CBC WITH DIFFERENTIAL/PLATELET
Abs Immature Granulocytes: 0.1 10*3/uL — ABNORMAL HIGH (ref 0.00–0.07)
Basophils Absolute: 0.1 10*3/uL (ref 0.0–0.1)
Basophils Relative: 1 %
Eosinophils Absolute: 0.1 10*3/uL (ref 0.0–0.5)
Eosinophils Relative: 1 %
HCT: 39.1 % (ref 36.0–46.0)
Hemoglobin: 12.7 g/dL (ref 12.0–15.0)
Immature Granulocytes: 1 %
Lymphocytes Relative: 25 %
Lymphs Abs: 2.7 10*3/uL (ref 0.7–4.0)
MCH: 32.1 pg (ref 26.0–34.0)
MCHC: 32.5 g/dL (ref 30.0–36.0)
MCV: 98.7 fL (ref 80.0–100.0)
Monocytes Absolute: 0.7 10*3/uL (ref 0.1–1.0)
Monocytes Relative: 6 %
Neutro Abs: 7.1 10*3/uL (ref 1.7–7.7)
Neutrophils Relative %: 66 %
Platelets: 460 10*3/uL — ABNORMAL HIGH (ref 150–400)
RBC: 3.96 MIL/uL (ref 3.87–5.11)
RDW: 13.8 % (ref 11.5–15.5)
WBC: 10.8 10*3/uL — ABNORMAL HIGH (ref 4.0–10.5)
nRBC: 0 % (ref 0.0–0.2)

## 2020-12-12 LAB — BASIC METABOLIC PANEL
Anion gap: 9 (ref 5–15)
BUN: 14 mg/dL (ref 8–23)
CO2: 16 mmol/L — ABNORMAL LOW (ref 22–32)
Calcium: 8.7 mg/dL — ABNORMAL LOW (ref 8.9–10.3)
Chloride: 116 mmol/L — ABNORMAL HIGH (ref 98–111)
Creatinine, Ser: 1.32 mg/dL — ABNORMAL HIGH (ref 0.44–1.00)
GFR, Estimated: 41 mL/min — ABNORMAL LOW (ref >60)
Glucose, Bld: 86 mg/dL (ref 70–99)
Potassium: 4.2 mmol/L (ref 3.5–5.1)
Sodium: 141 mmol/L (ref 135–145)

## 2020-12-12 LAB — IRON AND TIBC
Iron: 54 ug/dL (ref 28–170)
Saturation Ratios: 19 % (ref 10.4–31.8)
TIBC: 293 ug/dL (ref 250–450)
UIBC: 239 ug/dL

## 2020-12-12 LAB — C-REACTIVE PROTEIN: CRP: 0.7 mg/dL (ref <1.0)

## 2020-12-12 LAB — FERRITIN: Ferritin: 146 ng/mL (ref 11–307)

## 2020-12-14 ENCOUNTER — Encounter: Payer: Self-pay | Admitting: Internal Medicine

## 2020-12-14 ENCOUNTER — Inpatient Hospital Stay: Payer: Medicare PPO | Admitting: Internal Medicine

## 2020-12-14 ENCOUNTER — Inpatient Hospital Stay: Payer: Medicare PPO

## 2020-12-14 VITALS — BP 158/96 | HR 106 | Resp 16 | Ht 59.0 in | Wt 81.6 lb

## 2020-12-14 VITALS — BP 162/81 | HR 92 | Temp 97.0°F | Resp 17

## 2020-12-14 DIAGNOSIS — K9589 Other complications of other bariatric procedure: Secondary | ICD-10-CM

## 2020-12-14 DIAGNOSIS — M329 Systemic lupus erythematosus, unspecified: Secondary | ICD-10-CM | POA: Diagnosis not present

## 2020-12-14 DIAGNOSIS — Z903 Acquired absence of stomach [part of]: Secondary | ICD-10-CM | POA: Diagnosis not present

## 2020-12-14 DIAGNOSIS — I10 Essential (primary) hypertension: Secondary | ICD-10-CM | POA: Diagnosis not present

## 2020-12-14 DIAGNOSIS — D508 Other iron deficiency anemias: Secondary | ICD-10-CM | POA: Diagnosis not present

## 2020-12-14 DIAGNOSIS — R634 Abnormal weight loss: Secondary | ICD-10-CM | POA: Diagnosis not present

## 2020-12-14 DIAGNOSIS — E538 Deficiency of other specified B group vitamins: Secondary | ICD-10-CM | POA: Diagnosis not present

## 2020-12-14 DIAGNOSIS — J45909 Unspecified asthma, uncomplicated: Secondary | ICD-10-CM | POA: Diagnosis not present

## 2020-12-14 DIAGNOSIS — F32A Depression, unspecified: Secondary | ICD-10-CM | POA: Diagnosis not present

## 2020-12-14 DIAGNOSIS — R197 Diarrhea, unspecified: Secondary | ICD-10-CM | POA: Diagnosis not present

## 2020-12-14 DIAGNOSIS — D509 Iron deficiency anemia, unspecified: Secondary | ICD-10-CM | POA: Diagnosis not present

## 2020-12-14 MED ORDER — SODIUM CHLORIDE 0.9 % IV SOLN
Freq: Once | INTRAVENOUS | Status: AC
  Filled 2020-12-14: qty 250

## 2020-12-14 MED ORDER — IRON SUCROSE 20 MG/ML IV SOLN
200.0000 mg | Freq: Once | INTRAVENOUS | Status: AC
  Administered 2020-12-14: 200 mg via INTRAVENOUS

## 2020-12-14 NOTE — Assessment & Plan Note (Addendum)
#   Iron deficient anemia-s/p partial gastrectomy-hemoglobin stable-13.8 saturation 27%; hold off IV iron.  # weight loss- ? Etiology- diarrhea x1-2 loose stools x 2 w. On megace/referral to  # Left lower lobe necrotic pneumonia/abscess- ; CT chest Improving- LLL infiltrate- improved; clinically not suggestive of malignancy.  Dr.A.   # B12 deficiency secondary to surgery- continue parental B12 with PCP--STABLE.   # DISPOSITION: # refer to nutrition re: weight loss # proceed with Venofer # follow up in 6 month- MD-possible Venofer-; labs- 1-2 days prior- cbc/BMP/crp/iron studies/ferritin/ Dr.B  Cc: Dr.Hedrick

## 2020-12-14 NOTE — Progress Notes (Signed)
[place] OFFICE PROGRESS NOTE  Patient Care Team: [name], MD as PCP - General (Family Medicine)  Cancer Staging No matching staging information was found for the patient.   Oncology History   No history exists.  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 Patient also intolerant of oral iron  -   received parenteral iron therapy with Feraheme 510 mg x 2 doses in May 2014.   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  female pleasant patient above history of Iron deficiency anemia status post partial gastrectomy is here for follow-up.  Patient denies any worsening shortness of breath or cough.  No fevers.  Patient complains of weight loss.  Complains of diarrhea in the last 2 weeks.  Patient is on Megace.   Review of Systems  Constitutional: Positive for malaise/fatigue and weight loss. Negative for chills, diaphoresis and fever.  HENT: Negative for nosebleeds and sore throat.   Eyes: Negative for double vision.  Respiratory: Positive for cough, hemoptysis, sputum production and shortness of breath. Negative for wheezing.   Cardiovascular: Negative for chest pain, palpitations, orthopnea and leg swelling.  Gastrointestinal: Negative for abdominal pain, blood in stool, constipation, diarrhea, heartburn, melena, nausea and vomiting.       Sometimes- dysphagia  Genitourinary: Negative for dysuria, frequency and urgency.  Musculoskeletal: Negative for back pain and joint pain.  Skin: Negative.  Negative for itching and rash.  Neurological: Negative for dizziness, tingling, focal weakness, weakness and headaches.  Endo/Heme/Allergies: Does not bruise/bleed easily.  Psychiatric/Behavioral: Negative for depression. The patient is not nervous/anxious and  does not have insomnia.    PAST MEDICAL HISTORY :  Past Medical History:  Diagnosis Date  . Anemia    IRON INFUSIONS  . Arthritis    osteoarthritis/ RA  . Asthma    remote h/o asthma  . Depression    controlled  . H/O hypoglycemia   . H/O: GI bleed   . Headache(784.0)    h/o migraines  . Hearing loss, bilateral    wears hearing aides  . History of blood transfusion    pt states she had yellow jaundice  . Hypertension    H/O NO TX NOW  . Lupus (systemic lupus erythematosus) ([place]) 1995   in remission since 2005  . Nasal sinus congestion   . Osteoporosis with fracture   . Stress fracture of foot    left foot  . Vitamin B 12 deficiency     PAST SURGICAL HISTORY :   Past Surgical History:  Procedure Laterality Date  . ANTERIOR CERVICAL DECOMP/DISCECTOMY FUSION  2009  . BACK SURGERY    . CATARACT EXTRACTION W/PHACO Left 09/30/2016   Procedure: CATARACT EXTRACTION PHACO AND INTRAOCULAR LENS PLACEMENT (IOC);  Surgeon: Birder Robson, MD;  Location: [facility] ORS;  Service: Ophthalmology;  Laterality: Left;  Lot# [id number] H [place]: 00:43.6 AP%: 19.6 CDE: 8.55  . CATARACT EXTRACTION W/PHACO Right 01/11/2019   Procedure: CATARACT EXTRACTION PHACO AND INTRAOCULAR LENS PLACEMENT ([place]) RIGHT;  Surgeon: Birder Robson, MD;  Location: [facility] ORS;  Service: Ophthalmology;  Laterality: Right;  [place] 01:03.9 CDE 9.19 Fluid pack Lot # [id number]  . INTRAMEDULLARY (IM) NAIL INTERTROCHANTERIC Right 07/01/2016   Procedure: INTRAMEDULLARY (IM) NAIL EXCHANGE OF FEMORAL ROD;  Surgeon: Hessie Knows, MD;  Location: [facility] ORS;  Service: Orthopedics;  Laterality: Right;  . ORIF FEMUR FRACTURE  2012  . PARTIAL GASTRECTOMY  1963   and reconstruction 1973  . TONSILLECTOMY  1945    FAMILY HISTORY :   Family History  Problem Relation Age of Onset  . Breast cancer Neg Hx     SOCIAL HISTORY:   Social History   Tobacco Use  . Smoking status: Never Smoker  . Smokeless tobacco: Never Used  Substance Use  Topics  . Alcohol use: No  . Drug use: No    ALLERGIES:  is allergic to sulfa antibiotics.  MEDICATIONS:  Current Outpatient Medications  Medication Sig Dispense Refill  . albuterol (VENTOLIN HFA) 108 (90 Base) MCG/ACT inhaler Inhale into the lungs.    Marland Kitchen ascorbic Acid (VITAMIN C) 500 MG CPCR Take by mouth.    Marland Kitchen aspirin EC 81 MG tablet Take 81 mg by mouth daily.    . BELSOMRA 10 MG TABS Take 10 mg by mouth at bedtime.   5  . Calcium Carb-Cholecalciferol (CALTRATE 600+D3 PO) Take 1 tablet by mouth 2 (two) times daily.    . Calcium Carbonate-Vitamin D 600-400 MG-UNIT tablet Take by mouth.    . cholecalciferol (VITAMIN D) 1000 UNITS tablet Take 1,000 Units by mouth every evening.     . citalopram (CELEXA) 20 MG tablet Take 20 mg by mouth daily at 8 pm. (1900)    . citalopram (CELEXA) 20 MG tablet Take 1 tablet by mouth daily.    . clobetasol (TEMOVATE) 0.05 % external solution MIX IN A TUB OF CERAVE CREAM AND USE EVERYDAY UP TO TWICE DAILY ON ITCHY RASH UNTIL CLEAR, AVOID [place], [place], [place]. 50 mL 0  . cyanocobalamin (,VITAMIN B-12,) 1000 MCG/ML injection Inject 1,000 mcg into the muscle every 30 (thirty) days.     Marland Kitchen denosumab (PROLIA) 60 MG/ML SOSY injection Inject 60 mg into the skin every 6 (six) months.     . Fe Fum-FA-B Cmp-C-Zn-Mg-Mn-Cu (HEMATINIC PLUS VIT/MINERALS) 106-1 MG TABS Take 1 tablet by mouth daily with lunch.   3  . Fe Fum-FA-B Cmp-C-Zn-Mg-Mn-Cu (HEMATINIC PLUS VIT/MINERALS) 106-1 MG TABS Take 1 tablet by mouth daily.    Marland Kitchen gabapentin (NEURONTIN) 100 MG capsule Take 100-200 mg by mouth See admin instructions. Take 1 capsule (100 mg) by mouth in the morning & take 2 capsules (200 mg) by mouth at night.    Marland Kitchen HYDROcodone-acetaminophen (NORCO/VICODIN) 5-325 MG tablet Take 1 tablet by mouth every 4 (four) hours as needed (pain.).    Marland Kitchen ketoconazole (NIZORAL) 2 % shampoo Apply 1 application topically daily. massage into scalp and leave in for 10 minutes before rinsing out 120 mL 4   . megestrol (MEGACE) 40 MG/ML suspension Take 800 mg by mouth daily.    . megestrol (MEGACE) 40 MG/ML suspension Take by mouth.    . meloxicam (MOBIC) 7.5 MG tablet Take 7.5 mg by mouth daily with supper.     . meloxicam (MOBIC) 7.5 MG tablet Take 1 tablet by mouth daily.    . mirtazapine (REMERON) 30 MG tablet Take 30 mg by mouth at bedtime.    . mirtazapine (REMERON) 45 MG tablet Take 1 tablet by mouth at bedtime.    . mirtazapine (REMERON) 45 MG tablet     . Multiple Vitamins-Minerals (PRESERVISION AREDS) TABS Take 1 tablet by mouth 2 (two) times daily.     . pantoprazole (PROTONIX) 40 MG tablet Take 40 mg by mouth daily before supper.   1  . pantoprazole (PROTONIX) 40  MG tablet Take 1 tablet by mouth daily.    . Suvorexant (BELSOMRA) 10 MG TABS Take 1 tablet by mouth daily.    . Travoprost, BAK Free, (TRAVATAN) 0.004 % SOLN ophthalmic solution SMARTSIG:1 Drop(s) In Eye(s) Every Evening    . travoprost, benzalkonium, (TRAVATAN) 0.004 % ophthalmic solution Place 1 drop into both eyes at bedtime.    . vitamin C (ASCORBIC ACID) 500 MG tablet Take 500 mg by mouth 2 (two) times daily.     No current facility-administered medications for this visit.    PHYSICAL EXAMINATION: ECOG PERFORMANCE STATUS: 0 - Asymptomatic  BP (!) 158/96 (BP Location: Left Arm, Patient Position: Sitting, Cuff Size: Normal)   Pulse (!) 106   Resp 16   Ht 4\' 11"  (1.499 m)   Wt 81 lb 9.6 oz (37 kg)   SpO2 100%   BMI 16.48 kg/m   Filed Weights   12/14/20 1022  Weight: 81 lb 9.6 oz (37 kg)    Physical Exam Constitutional:      Comments: Thin built Caucasian female patient.  HENT:     Head: Normocephalic and atraumatic.     Mouth/Throat:     Pharynx: No oropharyngeal exudate.  Eyes:     Pupils: Pupils are equal, round, and reactive to light.  Cardiovascular:     Rate and Rhythm: Normal rate and regular rhythm.  Pulmonary:     Effort: Pulmonary effort is normal. No respiratory distress.     Breath  sounds: Normal breath sounds. No wheezing.  Abdominal:     General: Bowel sounds are normal. There is no distension.     Palpations: Abdomen is soft. There is no mass.     Tenderness: There is no abdominal tenderness. There is no guarding or rebound.  Musculoskeletal:        General: No tenderness. Normal range of motion.     Cervical back: Normal range of motion and neck supple.  Skin:    General: Skin is warm.  Neurological:     Mental Status: She is alert and oriented to person, place, and time.  Psychiatric:        Mood and Affect: Affect normal.      LABORATORY DATA:  I have reviewed the data as listed    Component Value Date/Time   NA 141 12/12/2020 1329   NA 142 02/09/2013 0515   K 4.2 12/12/2020 1329   K 4.0 02/09/2013 0515   CL 116 (H) 12/12/2020 1329   CL 113 (H) 02/09/2013 0515   CO2 16 (L) 12/12/2020 1329   CO2 25 02/09/2013 0515   GLUCOSE 86 12/12/2020 1329   GLUCOSE 80 02/09/2013 0515   BUN 14 12/12/2020 1329   BUN 20 (H) 02/09/2013 0515   CREATININE 1.32 (H) 12/12/2020 1329   CREATININE 0.58 (L) 02/09/2013 0515   CALCIUM 8.7 (L) 12/12/2020 1329   CALCIUM 7.5 (L) 02/09/2013 0515   PROT 7.6 10/06/2019 0902   PROT 6.8 02/08/2013 1558   ALBUMIN 2.9 (L) 10/06/2019 0902   ALBUMIN 3.8 02/08/2013 1558   AST 25 10/06/2019 0902   AST 16 02/08/2013 1558   ALT 28 10/06/2019 0902   ALT 12 02/08/2013 1558   ALKPHOS 75 10/06/2019 0902   ALKPHOS 53 02/08/2013 1558   BILITOT 0.3 10/06/2019 0902   BILITOT 0.2 02/08/2013 1558   GFRNONAA 41 (L) 12/12/2020 1329   GFRNONAA >60 02/09/2013 0515   GFRAA >60 10/06/2019 0902   GFRAA >60 02/09/2013 0515  No results found for: SPEP, UPEP  Lab Results  Component Value Date   WBC 10.8 (H) 12/12/2020   NEUTROABS 7.1 12/12/2020   HGB 12.7 12/12/2020   HCT 39.1 12/12/2020   MCV 98.7 12/12/2020   PLT 460 (H) 12/12/2020      Chemistry      Component Value Date/Time   NA 141 12/12/2020 1329   NA 142 02/09/2013 0515    K 4.2 12/12/2020 1329   K 4.0 02/09/2013 0515   CL 116 (H) 12/12/2020 1329   CL 113 (H) 02/09/2013 0515   CO2 16 (L) 12/12/2020 1329   CO2 25 02/09/2013 0515   BUN 14 12/12/2020 1329   BUN 20 (H) 02/09/2013 0515   CREATININE 1.32 (H) 12/12/2020 1329   CREATININE 0.58 (L) 02/09/2013 0515      Component Value Date/Time   CALCIUM 8.7 (L) 12/12/2020 1329   CALCIUM 7.5 (L) 02/09/2013 0515   ALKPHOS 75 10/06/2019 0902   ALKPHOS 53 02/08/2013 1558   AST 25 10/06/2019 0902   AST 16 02/08/2013 1558   ALT 28 10/06/2019 0902   ALT 12 02/08/2013 1558   BILITOT 0.3 10/06/2019 0902   BILITOT 0.2 02/08/2013 1558       RADIOGRAPHIC STUDIES: I have personally reviewed the radiological images as listed and agreed with the findings in the report. No results found.   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  # weight loss- ? Etiology- diarrhea x1-2 loose stools x 2 w. On megace/referral to  # Left lower lobe necrotic pneumonia/abscess- ; CT chest Improving- LLL infiltrate- improved; clinically not suggestive of malignancy.  Dr.A.   # B12 deficiency secondary to surgery- continue parental B12 with PCP--STABLE.   # DISPOSITION: # refer to nutrition re: weight loss # proceed with Venofer # follow up in 6 month- MD-possible Venofer-; labs- 1-2 days prior- cbc/BMP/crp/iron studies/ferritin/ Dr.B  Cc: Dr.Hedrick   Orders Placed This Encounter  Procedures  . CBC with Differential/Platelet    Standing Status:   Future    Standing Expiration Date:   12/14/2021  . Basic metabolic panel    Standing Status:   Future    Standing Expiration Date:   12/14/2021  . C-reactive protein    Standing Status:   Future    Standing Expiration Date:   12/14/2021  . Iron and TIBC    Standing Status:   Future    Standing Expiration Date:   12/14/2021  . Ferritin     Standing Status:   Future    Standing Expiration Date:   12/14/2021  . Ambulatory referral to Nutrition and Diabetic Education    Referral Priority:   Routine    Referral Type:   Consultation    Referral Reason:   Specialty Services Required    Number of Visits Requested:   1   All questions were answered. The patient knows to call the clinic with any problems, questions or concerns.      Cammie Sickle, MD 12/25/2020 8:12 AM

## 2020-12-14 NOTE — Progress Notes (Signed)
Concerned about weight loss. States she is trying to eat but does not have much of an appetite at all.

## 2020-12-19 ENCOUNTER — Encounter: Payer: Self-pay | Admitting: *Deleted

## 2020-12-19 DIAGNOSIS — J45909 Unspecified asthma, uncomplicated: Secondary | ICD-10-CM

## 2020-12-19 NOTE — Progress Notes (Signed)
Pulmonary Individual Treatment Plan  Patient Details  Name: RAYSHAWN VISCONTI MRN: [id number] Date of Birth: 1940/12/25 Referring Provider:   Flowsheet Row Pulmonary Rehab from 08/02/2020 in [facility] Cardiac and Pulmonary Rehab  Referring Provider Ottie Glazier MD      Initial Encounter Date:  Flowsheet Row Pulmonary Rehab from 08/02/2020 in [facility] Cardiac and Pulmonary Rehab  Date 08/02/20      Visit Diagnosis: Uncomplicated asthma, unspecified asthma severity, unspecified whether persistent  Patient's Home Medications on Admission:  Current Outpatient Medications:    albuterol (VENTOLIN HFA) 108 (90 Base) MCG/ACT inhaler, Inhale into the lungs., Disp: , Rfl:    ascorbic Acid (VITAMIN C) 500 MG CPCR, Take by mouth., Disp: , Rfl:    aspirin EC 81 MG tablet, Take 81 mg by mouth daily., Disp: , Rfl:    BELSOMRA 10 MG TABS, Take 10 mg by mouth at bedtime. , Disp: , Rfl: 5   Calcium Carb-Cholecalciferol (CALTRATE 600+D3 PO), Take 1 tablet by mouth 2 (two) times daily., Disp: , Rfl:    Calcium Carbonate-Vitamin D 600-400 MG-UNIT tablet, Take by mouth., Disp: , Rfl:    cholecalciferol (VITAMIN D) 1000 UNITS tablet, Take 1,000 Units by mouth every evening. , Disp: , Rfl:    citalopram (CELEXA) 20 MG tablet, Take 20 mg by mouth daily at 8 pm. (1900), Disp: , Rfl:    citalopram (CELEXA) 20 MG tablet, Take 1 tablet by mouth daily., Disp: , Rfl:    clobetasol (TEMOVATE) 0.05 % external solution, MIX IN A TUB OF CERAVE CREAM AND USE EVERYDAY UP TO TWICE DAILY ON ITCHY RASH UNTIL CLEAR, AVOID FACE, GROIN, AXILLAA., Disp: 50 mL, Rfl: 0   cyanocobalamin (,VITAMIN B-12,) 1000 MCG/ML injection, Inject 1,000 mcg into the muscle every 30 (thirty) days. , Disp: , Rfl:    denosumab (PROLIA) 60 MG/ML SOSY injection, Inject 60 mg into the skin every 6 (six) months. , Disp: , Rfl:    Fe Fum-FA-B Cmp-C-Zn-Mg-Mn-Cu (HEMATINIC PLUS VIT/MINERALS) 106-1 MG TABS, Take 1 tablet by mouth daily with lunch. ,  Disp: , Rfl: 3   Fe Fum-FA-B Cmp-C-Zn-Mg-Mn-Cu (HEMATINIC PLUS VIT/MINERALS) 106-1 MG TABS, Take 1 tablet by mouth daily., Disp: , Rfl:    gabapentin (NEURONTIN) 100 MG capsule, Take 100-200 mg by mouth See admin instructions. Take 1 capsule (100 mg) by mouth in the morning & take 2 capsules (200 mg) by mouth at night., Disp: , Rfl:    HYDROcodone-acetaminophen (NORCO/VICODIN) 5-325 MG tablet, Take 1 tablet by mouth every 4 (four) hours as needed (pain.)., Disp: , Rfl:    ketoconazole (NIZORAL) 2 % shampoo, Apply 1 application topically daily. massage into scalp and leave in for 10 minutes before rinsing out, Disp: 120 mL, Rfl: 4   megestrol (MEGACE) 40 MG/ML suspension, Take 800 mg by mouth daily., Disp: , Rfl:    megestrol (MEGACE) 40 MG/ML suspension, Take by mouth., Disp: , Rfl:    meloxicam (MOBIC) 7.5 MG tablet, Take 7.5 mg by mouth daily with supper. , Disp: , Rfl:    meloxicam (MOBIC) 7.5 MG tablet, Take 1 tablet by mouth daily., Disp: , Rfl:    mirtazapine (REMERON) 30 MG tablet, Take 30 mg by mouth at bedtime., Disp: , Rfl:    mirtazapine (REMERON) 45 MG tablet, Take 1 tablet by mouth at bedtime., Disp: , Rfl:    mirtazapine (REMERON) 45 MG tablet, , Disp: , Rfl:    Multiple Vitamins-Minerals (PRESERVISION AREDS) TABS, Take 1 tablet by mouth 2 (  two) times daily. , Disp: , Rfl:    pantoprazole (PROTONIX) 40 MG tablet, Take 40 mg by mouth daily before supper. , Disp: , Rfl: 1   pantoprazole (PROTONIX) 40 MG tablet, Take 1 tablet by mouth daily., Disp: , Rfl:    Suvorexant (BELSOMRA) 10 MG TABS, Take 1 tablet by mouth daily., Disp: , Rfl:    Travoprost, BAK Free, (TRAVATAN) 0.004 % SOLN ophthalmic solution, SMARTSIG:1 Drop(s) In Eye(s) Every Evening, Disp: , Rfl:    travoprost, benzalkonium, (TRAVATAN) 0.004 % ophthalmic solution, Place 1 drop into both eyes at bedtime., Disp: , Rfl:    vitamin C (ASCORBIC ACID) 500 MG tablet, Take 500 mg by mouth 2 (two) times daily.,  Disp: , Rfl:   Past Medical History: Past Medical History:  Diagnosis Date   Anemia    IRON INFUSIONS   Arthritis    osteoarthritis/ RA   Asthma    remote h/o asthma   Depression    controlled   H/O hypoglycemia    H/O: GI bleed    Headache(784.0)    h/o migraines   Hearing loss, bilateral    wears hearing aides   History of blood transfusion    pt states she had yellow jaundice   Hypertension    H/O NO TX NOW   Lupus (systemic lupus erythematosus) ([place]) 1995   in remission since 2005   Nasal sinus congestion    Osteoporosis with fracture    Stress fracture of foot    left foot   Vitamin B 12 deficiency     Tobacco Use: Social History   Tobacco Use  Smoking Status Never Smoker  Smokeless Tobacco Never Used    Labs: Recent Review Flowsheet Data   There is no flowsheet data to display.      Pulmonary Assessment Scores:  Pulmonary Assessment Scores    Row Name 08/02/20 1609         ADL UCSD   ADL Phase Entry     SOB Score total 17     Rest 0     Walk 1     Stairs 3     Bath 0     Dress 0     Shop 0           CAT Score   CAT Score 8           mMRC Score   mMRC Score 1            UCSD: Self-administered rating of dyspnea associated with activities of daily living (ADLs) 6-point scale (0 = "not at all" to 5 = "maximal or unable to do because of breathlessness")  Scoring Scores range from 0 to 120.  Minimally important difference is 5 units  CAT: CAT can identify the health impairment of COPD patients and is better correlated with disease progression.  CAT has a scoring range of zero to 40. The CAT score is classified into four groups of low (less than 10), medium (10 - 20), high (21-30) and very high (31-40) based on the impact level of disease on health status. A CAT score over 10 suggests significant symptoms.  A worsening CAT score could be explained by an exacerbation, poor medication adherence, poor inhaler technique, or  progression of COPD or comorbid conditions.  CAT MCID is 2 points  mMRC: mMRC (Modified Medical Research Council) Dyspnea Scale is used to assess the degree of baseline functional disability in patients of respiratory disease due to  dyspnea. No minimal important difference is established. A decrease in score of 1 point or greater is considered a positive change.   Pulmonary Function Assessment:  Pulmonary Function Assessment - 07/31/20 0940      Breath   Shortness of Breath Yes;Limiting activity           Exercise Target Goals: Exercise Program Goal: Individual exercise prescription set using results from initial 6 min walk test and THRR while considering  patients activity barriers and safety.   Exercise Prescription Goal: Initial exercise prescription builds to 30-45 minutes a day of aerobic activity, 2-3 days per week.  Home exercise guidelines will be given to patient during program as part of exercise prescription that the participant will acknowledge.  Education: Aerobic Exercise: - Group verbal and visual presentation on the components of exercise prescription. Introduces F.I.T.T principle from ACSM for exercise prescriptions.  Reviews F.I.T.T. principles of aerobic exercise including progression. Written material given at graduation. Flowsheet Row Pulmonary Rehab from 10/25/2020 in [facility] Cardiac and Pulmonary Rehab  Date 10/25/20  Hilda Blades only on 10/28]  Educator AS  Instruction Review Code 1- Verbalizes Understanding      Education: Resistance Exercise: - Group verbal and visual presentation on the components of exercise prescription. Introduces F.I.T.T principle from ACSM for exercise prescriptions  Reviews F.I.T.T. principles of resistance exercise including progression. Written material given at graduation.    Education: Exercise & Equipment Safety: - Individual verbal instruction and demonstration of equipment use and safety with use of the equipment. Flowsheet  Row Pulmonary Rehab from 10/25/2020 in [facility] Cardiac and Pulmonary Rehab  Date 07/31/20  Educator [facility]  Instruction Review Code 1- Verbalizes Understanding      Education: Exercise Physiology & General Exercise Guidelines: - Group verbal and written instruction with models to review the exercise physiology of the cardiovascular system and associated critical values. Provides general exercise guidelines with specific guidelines to those with heart or lung disease.    Education: Flexibility, Balance, Mind/Body Relaxation: - Group verbal and visual presentation with interactive activity on the components of exercise prescription. Introduces F.I.T.T principle from ACSM for exercise prescriptions. Reviews F.I.T.T. principles of flexibility and balance exercise training including progression. Also discusses the mind body connection.  Reviews various relaxation techniques to help reduce and manage stress (i.e. Deep breathing, progressive muscle relaxation, and visualization). Balance handout provided to take home. Written material given at graduation. Flowsheet Row Pulmonary Rehab from 10/25/2020 in [facility] Cardiac and Pulmonary Rehab  Date 08/30/20  Educator AS  Instruction Review Code 1- Verbalizes Understanding      Activity Barriers & Risk Stratification:  Activity Barriers & Cardiac Risk Stratification - 08/02/20 1620      Activity Barriers & Cardiac Risk Stratification   Activity Barriers Other (comment)    Comments 'Rod" placed in right leg           6 Minute Walk:  6 Minute Walk    Row Name 08/02/20 1612         6 Minute Walk   Phase Initial     Distance 1195 feet     Walk Time 6 minutes     # of Rest Breaks 9     MPH 2.26     METS 2.69     RPE 9     Perceived Dyspnea  0     VO2 Peak 9.44     Symptoms No     Resting HR 85 bpm     Resting BP 132/78  Resting Oxygen Saturation  98 %     Exercise Oxygen Saturation  during 6 min walk 93 %     Max Ex. HR 100 bpm     Max  Ex. BP 158/82     2 Minute Post BP 136/80           Interval HR   1 Minute HR 94     2 Minute HR 95     3 Minute HR 99     4 Minute HR 96     5 Minute HR 97     6 Minute HR 100     2 Minute Post HR 82     Interval Heart Rate? Yes           Interval Oxygen   Interval Oxygen? Yes     Baseline Oxygen Saturation % 98 %     1 Minute Oxygen Saturation % 96 %     1 Minute Liters of Oxygen 0 L  RA     2 Minute Oxygen Saturation % 96 %     2 Minute Liters of Oxygen 0 L     3 Minute Oxygen Saturation % 93 %     3 Minute Liters of Oxygen 0 L     4 Minute Oxygen Saturation % 94 %     4 Minute Liters of Oxygen 0 L     5 Minute Oxygen Saturation % 96 %     5 Minute Liters of Oxygen 0 L     6 Minute Oxygen Saturation % 93 %     6 Minute Liters of Oxygen 0 L     2 Minute Post Oxygen Saturation % 97 %     2 Minute Post Liters of Oxygen 0 L           Oxygen Initial Assessment:  Oxygen Initial Assessment - 08/02/20 1609      Home Oxygen   Home Oxygen Device None    Sleep Oxygen Prescription None    Home Exercise Oxygen Prescription None    Home Resting Oxygen Prescription None      Initial 6 min Walk   Oxygen Used None      Program Oxygen Prescription   Program Oxygen Prescription None      Intervention   Short Term Goals To learn and understand importance of monitoring SPO2 with pulse oximeter and demonstrate accurate use of the pulse oximeter.;To learn and understand importance of maintaining oxygen saturations>88%;To learn and demonstrate proper pursed lip breathing techniques or other breathing techniques.;To learn and demonstrate proper use of respiratory medications    Long  Term Goals Verbalizes importance of monitoring SPO2 with pulse oximeter and return demonstration;Maintenance of O2 saturations>88%;Exhibits proper breathing techniques, such as pursed lip breathing or other method taught during program session;Compliance with respiratory medication;Demonstrates proper use  of MDIs           Oxygen Re-Evaluation:  Oxygen Re-Evaluation    Row Name 08/21/20 0955 09/11/20 0958 10/04/20 1030 10/30/20 1003       Program Oxygen Prescription   Program Oxygen Prescription None None None None         Home Oxygen   Home Oxygen Device None None None None    Sleep Oxygen Prescription None None None None    Home Exercise Oxygen Prescription None None None None    Home Resting Oxygen Prescription None None None None         Goals/Expected Outcomes  Short Term Goals To learn and understand importance of monitoring SPO2 with pulse oximeter and demonstrate accurate use of the pulse oximeter.;To learn and understand importance of maintaining oxygen saturations>88%;To learn and demonstrate proper pursed lip breathing techniques or other breathing techniques.;To learn and demonstrate proper use of respiratory medications To learn and understand importance of monitoring SPO2 with pulse oximeter and demonstrate accurate use of the pulse oximeter.;To learn and understand importance of maintaining oxygen saturations>88%;To learn and demonstrate proper pursed lip breathing techniques or other breathing techniques.;To learn and demonstrate proper use of respiratory medications To learn and understand importance of monitoring SPO2 with pulse oximeter and demonstrate accurate use of the pulse oximeter.;To learn and understand importance of maintaining oxygen saturations>88%;To learn and demonstrate proper pursed lip breathing techniques or other breathing techniques.;To learn and demonstrate proper use of respiratory medications To learn and understand importance of monitoring SPO2 with pulse oximeter and demonstrate accurate use of the pulse oximeter.;To learn and understand importance of maintaining oxygen saturations>88%;To learn and demonstrate proper pursed lip breathing techniques or other breathing techniques.;To learn and demonstrate proper use of respiratory medications     Long  Term Goals Verbalizes importance of monitoring SPO2 with pulse oximeter and return demonstration;Maintenance of O2 saturations>88%;Exhibits proper breathing techniques, such as pursed lip breathing or other method taught during program session;Compliance with respiratory medication;Demonstrates proper use of MDIs Verbalizes importance of monitoring SPO2 with pulse oximeter and return demonstration;Maintenance of O2 saturations>88%;Exhibits proper breathing techniques, such as pursed lip breathing or other method taught during program session;Compliance with respiratory medication;Demonstrates proper use of MDIs Verbalizes importance of monitoring SPO2 with pulse oximeter and return demonstration;Maintenance of O2 saturations>88%;Exhibits proper breathing techniques, such as pursed lip breathing or other method taught during program session;Compliance with respiratory medication;Demonstrates proper use of MDIs Verbalizes importance of monitoring SPO2 with pulse oximeter and return demonstration;Maintenance of O2 saturations>88%;Exhibits proper breathing techniques, such as pursed lip breathing or other method taught during program session;Compliance with respiratory medication;Demonstrates proper use of MDIs    Comments Reviewed PLB technique with pt.  Talked about how it works and it's importance in maintaining their exercise saturations. She is on alberterol and uses the lung exercise equipment. She reports using PLB and feels that is more natural. She hasn't had to use her inhaler and the cold weather has not been affecting her breathing. Alonni is doing better with her breathing.  She is using her PLB at home and finds it helpful.  She going to get a pulse oximeter for home use.  She is also using her inspirometer. to help with breathings.  Her meds seem to be doing well for her. She has an inspirometer that she uses at home. She continues with her PLB and reports it helps both with breathing and with  her leg. She reports her breathing has been improving, but is having difficulty with the cold weather. She still does not have a pulse ox, but would like to get one.    Goals/Expected Outcomes Short: Become more profiecient at using PLB.   Long: Become independent at using PLB. Short: continue with lung exercises and practicing PLB.  Long: Become independent at using PLB. Short: Get pulse oximeter for home use  Long; Continue to use PLB. Short: Get pulse oximeter for home use  Long; Continue to use PLB.           Oxygen Discharge (Final Oxygen Re-Evaluation):  Oxygen Re-Evaluation - 10/30/20 1003      Program Oxygen Prescription  Program Oxygen Prescription None      Home Oxygen   Home Oxygen Device None    Sleep Oxygen Prescription None    Home Exercise Oxygen Prescription None    Home Resting Oxygen Prescription None      Goals/Expected Outcomes   Short Term Goals To learn and understand importance of monitoring SPO2 with pulse oximeter and demonstrate accurate use of the pulse oximeter.;To learn and understand importance of maintaining oxygen saturations>88%;To learn and demonstrate proper pursed lip breathing techniques or other breathing techniques.;To learn and demonstrate proper use of respiratory medications    Long  Term Goals Verbalizes importance of monitoring SPO2 with pulse oximeter and return demonstration;Maintenance of O2 saturations>88%;Exhibits proper breathing techniques, such as pursed lip breathing or other method taught during program session;Compliance with respiratory medication;Demonstrates proper use of MDIs    Comments She has an inspirometer that she uses at home. She continues with her PLB and reports it helps both with breathing and with her leg. She reports her breathing has been improving, but is having difficulty with the cold weather. She still does not have a pulse ox, but would like to get one.    Goals/Expected Outcomes Short: Get pulse oximeter for home  use  Long; Continue to use PLB.           Initial Exercise Prescription:  Initial Exercise Prescription - 08/02/20 1600      Date of Initial Exercise RX and Referring Provider   Date 08/02/20    Referring Provider Ottie Glazier MD      Treadmill   MPH 2.3    Grade 0.5    Minutes 15    METs 2.92      Recumbant Bike   Level 2    RPM 60    Watts 10    Minutes 15    METs 2.69      NuStep   Level 3    SPM 80    Minutes 15    METs 2.69      Prescription Details   Frequency (times per week) 2    Duration Progress to 30 minutes of continuous aerobic without signs/symptoms of physical distress      Intensity   THRR 40-80% of Max Heartrate 107-129    Ratings of Perceived Exertion 11-13    Perceived Dyspnea 0-4      Resistance Training   Training Prescription Yes           Perform Capillary Blood Glucose checks as needed.  Exercise Prescription Changes:  Exercise Prescription Changes    Row Name 08/02/20 1600 08/23/20 1100 09/06/20 0700 09/19/20 1200 10/03/20 1000     Response to Exercise   Blood Pressure (Admit) 132/78 142/88 130/76 114/68 118/76   Blood Pressure (Exercise) 158/82 148/64 124/66 140/76 142/80   Blood Pressure (Exit) 130/78 110/60 104/68 106/68 118/72   Heart Rate (Admit) 85 bpm 83 bpm 85 bpm 80 bpm 87 bpm   Heart Rate (Exercise) 100 bpm 100 bpm 97 bpm 107 bpm 101 bpm   Heart Rate (Exit) 82 bpm 80 bpm 85 bpm 95 bpm 87 bpm   Oxygen Saturation (Admit) 98 % 97 % 96 % 98 % 98 %   Oxygen Saturation (Exercise) 93 % 95 % 98 % 96 % 96 %   Oxygen Saturation (Exit) 98 % 99 % 98 % 98 % 97 %   Rating of Perceived Exertion (Exercise) 9 11 11 11 11    Perceived Dyspnea (Exercise) 0  1 0 0 0   Symptoms none -- -- -- --   Comments walk test results first day none none none   Duration Progress to 30 minutes of  aerobic without signs/symptoms of physical distress -- Progress to 30 minutes of  aerobic without signs/symptoms of physical distress Continue with 30  min of aerobic exercise without signs/symptoms of physical distress. Continue with 30 min of aerobic exercise without signs/symptoms of physical distress.   Intensity -- -- THRR unchanged THRR unchanged THRR unchanged     Progression   Progression -- -- Continue to progress workloads to maintain intensity without signs/symptoms of physical distress. Continue to progress workloads to maintain intensity without signs/symptoms of physical distress. Continue to progress workloads to maintain intensity without signs/symptoms of physical distress.   Average METs -- -- 2.2 2 2.05     Resistance Training   Training Prescription -- Yes Yes Yes Yes   Weight 3 lb 1 lb 1 lb 1 lb 1 lb   Reps 10-15 10-15 10-15 10-15 10-15     Interval Training   Interval Training -- -- No No No     Treadmill   MPH -- 0.7 1.2 1.2 1.8   Grade -- 0 0.5 0.5 0.5   Minutes -- 15 15 15 15    METs -- 1.54 2 2 2.5     Recumbant Bike   Level -- -- 2 -- --   Minutes -- -- 15 -- --     NuStep   Level -- 3 3 3 3    SPM -- 80 -- 80 --   Minutes -- 15 15 15 15    METs -- 1.2 2.4 1.9 1.6   Row Name 10/30/20 1600             Response to Exercise   Blood Pressure (Admit) 128/72       Blood Pressure (Exercise) 128/60       Blood Pressure (Exit) 112/62       Heart Rate (Admit) 87 bpm       Heart Rate (Exercise) 97 bpm       Heart Rate (Exit) 83 bpm       Oxygen Saturation (Admit) 98 %       Oxygen Saturation (Exercise) 93 %       Oxygen Saturation (Exit) 98 %       Rating of Perceived Exertion (Exercise) 15       Perceived Dyspnea (Exercise) 0       Comments none       Duration Continue with 30 min of aerobic exercise without signs/symptoms of physical distress.       Intensity THRR unchanged               Progression   Progression Continue to progress workloads to maintain intensity without signs/symptoms of physical distress.       Average METs 2               Resistance Training   Training Prescription Yes        Weight 1 lb       Reps 10-15               Treadmill   MPH 1.2       Grade 0.5       Minutes 15       METs 2               Recumbant Bike   Level  2       Minutes 15               NuStep   Level 3       Minutes 15       METs 2               Home Exercise Plan   Plans to continue exercise at Home (comment)  walking and staff videos       Frequency Add 2 additional days to program exercise sessions.       Initial Home Exercises Provided 10/02/20              Exercise Comments:  Exercise Comments    Row Name 10/08/20 1342 10/24/20 0606         Exercise Comments [employer] called to let us know that she saw her doctor for her sciatica pain and he felt that when she tried the 3 lb weights, she may have pulled something and encouraged her to rest a week.  We also encouraged her to stretch and ice (if able) to help as well.  She will keep [place] posted on next week. Returned to program yesterday.  comcerned about her spinal stenosis and exercising without aggravating sciatica. Got her started back slowly with instruction to stop as needed.             Exercise Goals and Review:  Exercise Goals    Row Name 08/02/20 1630             Exercise Goals   Increase Physical Activity Yes       Intervention Provide advice, education, support and counseling about physical activity/exercise needs.;Develop an individualized exercise prescription for aerobic and resistive training based on initial evaluation findings, risk stratification, comorbidities and participant's personal goals.       Expected Outcomes Short Term: Attend rehab on a regular basis to increase amount of physical activity.;Long Term: Add in home exercise to make exercise part of routine and to increase amount of physical activity.;Long Term: Exercising regularly at least 3-5 days a week.       Increase Strength and Stamina Yes       Intervention Provide advice, education, support and counseling about physical  activity/exercise needs.;Develop an individualized exercise prescription for aerobic and resistive training based on initial evaluation findings, risk stratification, comorbidities and participant's personal goals.       Expected Outcomes Short Term: Increase workloads from initial exercise prescription for resistance, speed, and METs.;Short Term: Perform resistance training exercises routinely during rehab and add in resistance training at home;Long Term: Improve cardiorespiratory fitness, muscular endurance and strength as measured by increased METs and functional capacity (6MWT)       Able to understand and use rate of perceived exertion (RPE) scale Yes       Intervention Provide education and explanation on how to use RPE scale       Expected Outcomes Short Term: Able to use RPE daily in rehab to express subjective intensity level;Long Term:  Able to use RPE to guide intensity level when exercising independently       Able to understand and use Dyspnea scale Yes       Intervention Provide education and explanation on how to use Dyspnea scale       Expected Outcomes Short Term: Able to use Dyspnea scale daily in rehab to express subjective sense of shortness of breath during exertion;Long Term: Able to use Dyspnea scale to guide intensity  level when exercising independently       Knowledge and understanding of Target Heart Rate Range (THRR) Yes       Intervention Provide education and explanation of THRR including how the numbers were predicted and where they are located for reference       Expected Outcomes Short Term: Able to state/look up THRR;Short Term: Able to use daily as guideline for intensity in rehab;Long Term: Able to use THRR to govern intensity when exercising independently       Able to check pulse independently Yes       Intervention Provide education and demonstration on how to check pulse in carotid and radial arteries.;Review the importance of being able to check your own pulse for  safety during independent exercise       Expected Outcomes Short Term: Able to explain why pulse checking is important during independent exercise;Long Term: Able to check pulse independently and accurately       Understanding of Exercise Prescription Yes       Intervention Provide education, explanation, and written materials on patient's individual exercise prescription       Expected Outcomes Short Term: Able to explain program exercise prescription;Long Term: Able to explain home exercise prescription to exercise independently              Exercise Goals Re-Evaluation :  Exercise Goals Re-Evaluation    Row Name 08/21/20 0955 09/06/20 0736 09/11/20 1001 09/19/20 1228 10/03/20 1013     Exercise Goal Re-Evaluation   Exercise Goals Review Increase Physical Activity;Able to understand and use rate of perceived exertion (RPE) scale;Knowledge and understanding of Target Heart Rate Range (THRR);Understanding of Exercise Prescription;Increase Strength and Stamina;Able to check pulse independently;Able to understand and use Dyspnea scale Increase Physical Activity;Increase Strength and Stamina;Understanding of Exercise Prescription Increase Physical Activity;Increase Strength and Stamina;Understanding of Exercise Prescription Increase Physical Activity;Increase Strength and Stamina;Understanding of Exercise Prescription Increase Physical Activity;Increase Strength and Stamina;Understanding of Exercise Prescription   Comments Reviewed RPE and dyspnea scales, THR and program prescription with pt today.  Pt voiced understanding and was given a copy of goals to take home. Shiah is off to a good start in rehab.  She is already up to level 3 on the NuStep.  We will continue to monitor her progress. She walks her dog and goes to the park - 20-30 minutes (3x/week). Can't walk in her neighborhood due to large dogs. She does not do any weighted exercises at home. She has not reviewed home exercise yet. Eleanors  oxygen levels are in 90s during exercise.  She reoprts RPE of 11.  She reaches THR range sometimes.  Staff will encourage increasing levels to reach THR range and review home exercise. Mirela is doing well in rehab.  She is already up to level 3 on the NuStep.  We will continue to monitor her progress.   Expected Outcomes Short: Use RPE daily to regulate intensity. Long: Follow program prescription in THR. Short: Continue to attend class regularly Long: Continue to follow program prescription ST: meet with EP regarding home exercise LT: continue attending pulmonary rehab consistently Short: review home exercise and THR range Long: build overall stamina Short: try 2 lb weights Long: Continue to improve stamina.   [place] Name 10/04/20 1013 10/30/20 1001 11/26/20 1558         Exercise Goal Re-Evaluation   Exercise Goals Review Increase Physical Activity;Understanding of Exercise Prescription;Increase Strength and Stamina;Able to understand and use rate of perceived exertion (RPE) scale;Able  to understand and use Dyspnea scale;Knowledge and understanding of Target Heart Rate Range (THRR);Able to check pulse independently Increase Physical Activity;Understanding of Exercise Prescription;Increase Strength and Stamina;Able to understand and use rate of perceived exertion (RPE) scale;Able to understand and use Dyspnea scale;Knowledge and understanding of Target Heart Rate Range (THRR);Able to check pulse independently --     Comments Elenor is doing well in rehab.  Her leg has been bothering her and she has an appointment tomorrow to look at her rod again.  She continues to chase her dog and cat each to stay active at home. Reviewed home exercise with pt today.  Pt plans to walk and use staff videos for exercise.  Reviewed THR, pulse, RPE, sign and symptoms, pulse oximetery and when to call 911 or MD.  Also discussed weather considerations and indoor options.  Pt voiced understanding. Olene is back from being out  due to leg pain and is doing well. She continues to stay active with her animals at home. She hasn't tried our workout videos yet due to her leg, she has chores at home she needs to do , but hasn't with leg. she walks her dog in the park when she can. Out on medical hold     Expected Outcomes Short: Start to add in more exercise at home Long: Continue to improve stamina. Short: Start to add in more exercise at home as tolerated with leg pain  Long: Continue to improve stamina. --            Discharge Exercise Prescription (Final Exercise Prescription Changes):  Exercise Prescription Changes - 10/30/20 1600      Response to Exercise   Blood Pressure (Admit) 128/72    Blood Pressure (Exercise) 128/60    Blood Pressure (Exit) 112/62    Heart Rate (Admit) 87 bpm    Heart Rate (Exercise) 97 bpm    Heart Rate (Exit) 83 bpm    Oxygen Saturation (Admit) 98 %    Oxygen Saturation (Exercise) 93 %    Oxygen Saturation (Exit) 98 %    Rating of Perceived Exertion (Exercise) 15    Perceived Dyspnea (Exercise) 0    Comments none    Duration Continue with 30 min of aerobic exercise without signs/symptoms of physical distress.    Intensity THRR unchanged      Progression   Progression Continue to progress workloads to maintain intensity without signs/symptoms of physical distress.    Average METs 2      Resistance Training   Training Prescription Yes    Weight 1 lb    Reps 10-15      Treadmill   MPH 1.2    Grade 0.5    Minutes 15    METs 2      Recumbant Bike   Level 2    Minutes 15      NuStep   Level 3    Minutes 15    METs 2      Home Exercise Plan   Plans to continue exercise at Home (comment)   walking and staff videos   Frequency Add 2 additional days to program exercise sessions.    Initial Home Exercises Provided 10/02/20           Nutrition:  Target Goals: Understanding of nutrition guidelines, daily intake of sodium 1500mg , cholesterol 200mg , calories 30% from  fat and 7% or less from saturated fats, daily to have 5 or more servings of fruits and vegetables.  Education: All  About Nutrition: -Group instruction provided by verbal, written material, interactive activities, discussions, models, and posters to present general guidelines for heart healthy nutrition including fat, fiber, MyPlate, the role of sodium in heart healthy nutrition, utilization of the nutrition label, and utilization of this knowledge for meal planning. Follow up email sent as well. Written material given at graduation. Flowsheet Row Pulmonary Rehab from 10/25/2020 in [facility] Card[facility] Pulmonary Rehab  Date 09/06/20  Educator [facility]  Instruction Review Code 1- Verbalizes Understanding      Biometrics:    Nutrition Therapy Plan and Nutrition Goals:  Nutrition Therapy & Goals - 09/11/20 0947      Nutrition Therapy   Diet Pulmonary MNT, high kcal, high protien    Protein (specify units) 57g    Fiber 25 grams    Whole Grain Foods 3 servings    Saturated Fats 12 max. grams    Fruits and Vegetables 5 servings/day    Sodium 1.5 grams      Personal Nutrition Goals   Nutrition Goal ST: add boost 2x/day, add fat and protein to meals (mashed potatoes with butter and cream with beans for a snack, nuts or nut butter and milk to oatmeal for example) LT: gain weight - 1st goal 100 lbs.    Comments Have not completed initial evaluation, however, did do one on one education with patient. She has continuously been losing weight and is at 88 pounds. She also reports needing more iron. Discussed heart healthy eating, high calorie and high protein while still eating heart healthy. She is very interested in power bowls and loves fruit and vegetables. 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 She doesn't normally eat breakfast. She eats whatever she feels like at that time. She loves pasta - she is having altered taste. She reports having small meals. She reports eating a lot of  eggs and cheese. 2 meals she will have a good protein source. She adds fats to her meals like bacon, olive oil, butter. She reports loving potatoes. She has about a fistful of food per meal. She reports getting a medication to help with appetite - her appetite has improved some. Discussed adding in boost 2x/day. Discussed heart healthy eating and MNT to gain weight. Small frequent meals, boost, protein at all meals, added fat to meals.      Intervention Plan   Intervention Prescribe, educate and counsel regarding individualized specific dietary modifications aiming towards targeted core components such as weight, hypertension, lipid management, diabetes, heart failure and other comorbidities.;Nutrition handout(s) given to patient.    Expected Outcomes Short Term Goal: Understand basic principles of dietary content, such as calories, fat, sodium, cholesterol and nutrients.;Short Term Goal: A plan has been developed with personal nutrition goals set during dietitian appointment.;Long Term Goal: Adherence to prescribed nutrition plan.           Nutrition Assessments:  Nutrition Assessments - 08/02/20 1609      MEDFICTS Scores   Pre Score 66          MEDIFICTS Score Key:  ?70 Need to make dietary changes   40-70 Heart Healthy Diet  ? 40 Therapeutic Level Cholesterol Diet   Picture Your Plate Scores:  <08 Unhealthy dietary pattern with much room for improvement.  41-50 Dietary pattern unlikely to meet recommendations for good health and room for improvement.  51-60 More healthful dietary pattern, with some room for improvement.   >60 Healthy dietary pattern, although there may be some specific  behaviors that could be improved.   Nutrition Goals Re-Evaluation:  Nutrition Goals Re-Evaluation    [place] Name 10/04/20 1026 10/30/20 0951           Goals   Nutrition Goal ST: add boost 2x/day, add fat and protein to meals (mashed potatoes with butter and cream with beans for a snack,  nuts or nut butter and milk to oatmeal for example) LT: gain weight - 1st goal 100 lbs. ST: add boost 2x/day, add fat and protein to meals (mashed potatoes with butter and cream with beans for a snack, nuts or nut butter and milk to oatmeal for example) LT: gain weight - 1st goal 100 lbs.      Comment Daziyah has not been gaining any weight. She is taking the magesterol, but her appetite is lacking this week. She is using the boost daily now.  The ensure upsets her stomach, but boost is doing well.  She is trying to add in the fat and protein, but still hard to cook for one.  We talked about freezing some to help out. Shailah has lost more weight, sent note to her doctor suggesting referral to RD who can follow he rmore closely and prescribe supplemental nutrition to help with oral intake. Terecia reports her appetite is not good even with the megace - she reports having a doctors appointment and will speak to MD regarding medication and weight. Reiterated high kcal and high protein MNT.      Expected Outcome Short: Try cooking and freezing meals  and continue with boost Long: Continue work on weight gain ST: add boost 2x/day, add fat and protein to meals (mashed potatoes with butter and cream with beans for a snack, nuts or nut butter and milk to oatmeal for example) LT: gain weight - 1st goal 100 lbs.             Nutrition Goals Discharge (Final Nutrition Goals Re-Evaluation):  Nutrition Goals Re-Evaluation - 10/30/20 0951      Goals   Nutrition Goal ST: add boost 2x/day, add fat and protein to meals (mashed potatoes with butter and cream with beans for a snack, nuts or nut butter and milk to oatmeal for example) LT: gain weight - 1st goal 100 lbs.    Comment Ellean has lost more weight, sent note to her doctor suggesting referral to RD who can follow he rmore closely and prescribe supplemental nutrition to help with oral intake. Toyna reports her appetite is not good even with the megace - she  reports having a doctors appointment and will speak to MD regarding medication and weight. Reiterated high kcal and high protein MNT.    Expected Outcome ST: add boost 2x/day, add fat and protein to meals (mashed potatoes with butter and cream with beans for a snack, nuts or nut butter and milk to oatmeal for example) LT: gain weight - 1st goal 100 lbs.           Psychosocial: Target Goals: Acknowledge presence or absence of significant depression and/or stress, maximize coping skills, provide positive support system. Participant is able to verbalize types and ability to use techniques and skills needed for reducing stress and depression.   Education: Stress, Anxiety, and Depression - Group verbal and visual presentation to define topics covered.  Reviews how body is impacted by stress, anxiety, and depression.  Also discusses healthy ways to reduce stress and to treat/manage anxiety and depression.  Written material given at graduation. Flowsheet Row Pulmonary Rehab  from 10/25/2020 in [facility] Cardiac and Pulmonary Rehab  Date 10/04/20  Educator SB  Instruction Review Code 1- [employer] Understanding      Education: Sleep Hygiene -Provides group verbal and written instruction about how sleep can affect your health.  Define sleep hygiene, discuss sleep cycles and impact of sleep habits. Review good sleep hygiene tips.    Initial Review & Psychosocial Screening:  Initial Psych Review & Screening - 07/31/20 0943      Initial Review   Current issues with Current Psychotropic Meds;Current Stress Concerns    Comments Her back bothers her and is wanting to do more exercise to help with mood.      Family Dynamics   Good Support System? Yes    Comments She can look to her cousin and friend for support.      Barriers   Psychosocial barriers to participate in program The patient should benefit from training in stress management and relaxation.      Screening Interventions   Interventions  Encouraged to exercise;To provide support and resources with identified psychosocial needs;Provide feedback about the scores to participant    Expected Outcomes Short Term goal: Utilizing psychosocial counselor, staff and physician to assist with identification of specific Stressors or current issues interfering with healing process. Setting desired goal for each stressor or current issue identified.;Long Term Goal: Stressors or current issues are controlled or eliminated.;Short Term goal: Identification and review with participant of any Quality of Life or Depression concerns found by scoring the questionnaire.;Long Term goal: The participant improves quality of Life and PHQ9 Scores as seen by post scores and/or verbalization of changes           Quality of Life Scores:  Scores of 19 and below usually indicate a poorer quality of life in these areas.  A difference of  2-3 points is a clinically meaningful difference.  A difference of 2-3 points in the total score of the Quality of Life Index has been associated with significant improvement in overall quality of life, self-image, physical symptoms, and general health in studies assessing change in quality of life.  PHQ-9: Recent Review Flowsheet Data    Depression screen [facility] 2/9 08/02/2020   Decreased Interest 0   Down, Depressed, Hopeless 0   PHQ - 2 Score 0   Altered sleeping 0   Tired, decreased energy 1   Change in appetite 1   Feeling bad or failure about yourself  0   Trouble concentrating 0   Moving slowly or fidgety/restless 0   Suicidal thoughts 0   PHQ-9 Score 2   Difficult doing work/chores Not difficult at all     Interpretation of Total Score  Total Score Depression Severity:  1-4 = Minimal depression, 5-9 = Mild depression, 10-14 = Moderate depression, 15-19 = Moderately severe depression, 20-27 = Severe depression   Psychosocial Evaluation and Intervention:  Psychosocial Evaluation - 07/31/20 0945      Psychosocial  Evaluation & Interventions   Interventions Encouraged to exercise with the program and follow exercise prescription;Relaxation education;Stress management education    Comments She can look to her cousin and friend for support. She states that with everything that is going on with COVID is stressful.    Expected Outcomes Short: Exercise regularly to support mental health and notify staff of any changes. Long: maintain mental health and well being through teaching of rehab or prescribed medications independently.    Continue Psychosocial Services  Follow up required by staff  Psychosocial Re-Evaluation:  Psychosocial Re-Evaluation    [place] Name 09/11/20 1004 10/04/20 1024 10/30/20 0958         Psychosocial Re-Evaluation   Current issues with Current Stress Concerns;Current Psychotropic Meds Current Stress Concerns;Current Psychotropic Meds Current Psychotropic Meds     Comments She has recently had a death in her family and is going to the funeral this Thursday. She has some stress regarding the current state of the world. She is on an anti-anxiety medication after a bad divorce and reports doing well on it. She rpeorts having a good support system - 28 year old friend who she speaks to daily, cousin was a good support system - passed recently - his children are supporting her, church supports her as well. She is on a sleep medication which has been helping - she was never a good sleeper - she sleeps at 11pm and wakes up to her dog at 7am. She reads to help manage stress and croquets. Elenor is doing well in rehab. She tries not to let things bother her as much, she does not have as much family left.  She has a couple of cousins left that support her and her chuch family.  She continues to sleep good.  She continues to do well on her meds. She reports no current stress concerns, she is still taking her medication as directed and it is still helping. She reports her sleep has been getting  better with new medicaiton - she is getting at least 6-7 hours of sleep until her dog wakes her up.     Expected Outcomes ST: Use her support system during this time of grieving LT: continue to use realxing activities and take medication as directed. short: Continue to lean on family as needed  Long: Continue to make time to rest and recover. short: Continue to lean on family as needed  Long: Continue to make time to rest and recover.     Interventions Encouraged to attend Pulmonary Rehabilitation for the exercise Encouraged to attend Pulmonary Rehabilitation for the exercise Encouraged to attend Pulmonary Rehabilitation for the exercise     Continue Psychosocial Services  Follow up required by staff -- Follow up required by staff     Comments She has recently had a death in her family and is going to the funeral this Thursday. She has some stress regarding the current state of the world. She is on an anti-anxiety medication after a bad divorce and reports doing well on it. She rpeorts having a good support system - 68 year old friend who she speaks to daily, cousin was a good support system - passed recently - his children are supporting her, church supports her as well. She is on a sleep medication which has been helping - she was never a good sleeper - she sleeps at 11pm and wakes up to her dog at 7am. She reads to help manage stress and croquets. -- --           Initial Review   Source of Stress Concerns Family -- --            Psychosocial Discharge (Final Psychosocial Re-Evaluation):  Psychosocial Re-Evaluation - 10/30/20 0958      Psychosocial Re-Evaluation   Current issues with Current Psychotropic Meds    Comments She reports no current stress concerns, she is still taking her medication as directed and it is still helping. She reports her sleep has been getting better with new medicaiton - she  is getting at least 6-7 hours of sleep until her dog wakes her up.    Expected Outcomes short:  Continue to lean on family as needed  Long: Continue to make time to rest and recover.    Interventions Encouraged to attend Pulmonary Rehabilitation for the exercise    Continue Psychosocial Services  Follow up required by staff           Education: Education Goals: Education classes will be provided on a weekly basis, covering required topics. Participant will state understanding/return demonstration of topics presented.  Learning Barriers/Preferences:  Learning Barriers/Preferences - 07/31/20 0941      Learning Barriers/Preferences   Learning Barriers Hearing    Learning Preferences None           General Pulmonary Education Topics:  Infection Prevention: - Provides verbal and written material to individual with discussion of infection control including proper hand washing and proper equipment cleaning during exercise session. Flowsheet Row Pulmonary Rehab from 10/25/2020 in [facility] Cardiac and Pulmonary Rehab  Date 07/31/20  Educator [facility]  Instruction Review Code 1- Verbalizes Understanding      Falls Prevention: - Provides verbal and written material to individual with discussion of falls prevention and safety. Flowsheet Row Pulmonary Rehab from 10/25/2020 in [facility] Cardiac and Pulmonary Rehab  Date 07/31/20  Educator [facility]  Instruction Review Code 1- Verbalizes Understanding      Chronic Lung Disease Review: - Group verbal instruction with posters, models, PowerPoint presentations and videos,  to review new updates, new respiratory medications, new advancements in procedures and treatments. Providing information on websites and "800" numbers for continued self-education. Includes information about supplement oxygen, available portable oxygen systems, continuous and intermittent flow rates, oxygen safety, concentrators, and Medicare reimbursement for oxygen. Explanation of Pulmonary Drugs, including class, frequency, complications, importance of spacers, rinsing mouth after  steroid MDI's, and proper cleaning methods for nebulizers. Review of basic lung anatomy and physiology related to function, structure, and complications of lung disease. Review of risk factors. Discussion about methods for diagnosing sleep apnea and types of masks and machines for OSA. Includes a review of the use of types of environmental controls: home humidity, furnaces, filters, dust mite/pet prevention, HEPA vacuums. Discussion about weather changes, air quality and the benefits of nasal washing. Instruction on Warning signs, infection symptoms, calling MD promptly, preventive modes, and value of vaccinations. Review of effective airway clearance, coughing and/or vibration techniques. Emphasizing that all should Create an Action Plan. Written material given at graduation. Flowsheet Row Pulmonary Rehab from 10/25/2020 in [facility] Cardiac and Pulmonary Rehab  Date 09/27/20  Educator jh  Instruction Review Code 1- Verbalizes Understanding      AED/CPR: - Group verbal and written instruction with the use of models to demonstrate the basic use of the AED with the basic ABC's of resuscitation.    Anatomy and Cardiac Procedures: - Group verbal and visual presentation and models provide information about basic cardiac anatomy and function. Reviews the testing methods done to diagnose heart disease and the outcomes of the test results. Describes the treatment choices: Medical Management, Angioplasty, or Coronary Bypass Surgery for treating various heart conditions including Myocardial Infarction, Angina, Valve Disease, and Cardiac Arrhythmias.  Written material given at graduation. Flowsheet Row Pulmonary Rehab from 10/25/2020 in [facility] Cardiac and Pulmonary Rehab  Date 08/23/20  Educator SB  Instruction Review Code 1- Verbalizes Understanding      Medication Safety: - Group verbal and visual instruction to review commonly prescribed medications for heart and  lung disease. Reviews the medication, class  of the drug, and side effects. Includes the steps to properly store meds and maintain the prescription regimen.  Written material given at graduation.   Other: -Provides group and verbal instruction on various topics (see comments)   Knowledge Questionnaire Score:  Knowledge Questionnaire Score - 08/02/20 1611      Knowledge Questionnaire Score   Pre Score 14/18: Oxygen, ADLs,            Core Components/Risk Factors/Patient Goals at Admission:  Personal Goals and Risk Factors at Admission - 08/02/20 1631      Core Components/Risk Factors/Patient Goals on Admission    Weight Management Yes;Weight Maintenance    Intervention Weight Management: Provide education and appropriate resources to help participant work on and attain dietary goals.;Weight Management/Obesity: Establish reasonable short term and long term weight goals.;Weight Management: Develop a combined nutrition and exercise program designed to reach desired caloric intake, while maintaining appropriate intake of nutrient and fiber, sodium and fats, and appropriate energy expenditure required for the weight goal.    Admit Weight 92 lb 11.2 oz (42 kg)    Goal Weight: Short Term 92 lb 11.2 oz (42 kg)    Goal Weight: Long Term 92 lb 11.2 oz (42 kg)    Expected Outcomes Short Term: Continue to assess and modify interventions until short term weight is achieved;Long Term: Adherence to nutrition and physical activity/exercise program aimed toward attainment of established weight goal;Weight Maintenance: Understanding of the daily nutrition guidelines, which includes 25-35% calories from fat, 7% or less cal from saturated fats, less than 200mg  cholesterol, less than 1.5gm of sodium, & 5 or more servings of fruits and vegetables daily;Understanding recommendations for meals to include 15-35% energy as protein, 25-35% energy from fat, 35-60% energy from carbohydrates, less than 200mg  of dietary cholesterol, 20-35 gm of total fiber  daily;Understanding of distribution of calorie intake throughout the day with the consumption of 4-5 meals/snacks    Improve shortness of breath with ADL's Yes    Intervention Provide education, individualized exercise plan and daily activity instruction to help decrease symptoms of SOB with activities of daily living.    Expected Outcomes Short Term: Improve cardiorespiratory fitness to achieve a reduction of symptoms when performing ADLs;Long Term: Be able to perform more ADLs without symptoms or delay the onset of symptoms    Hypertension Yes    Intervention Provide education on lifestyle modifcations including regular physical activity/exercise, weight management, moderate sodium restriction and increased consumption of fresh fruit, vegetables, and low fat dairy, alcohol moderation, and smoking cessation.;Monitor prescription use compliance.    Expected Outcomes Short Term: Continued assessment and intervention until BP is < 140/38mm HG in hypertensive participants. < 130/1mm HG in hypertensive participants with diabetes, heart failure or chronic kidney disease.;Long Term: Maintenance of blood pressure at goal levels.           Education:Diabetes - Individual verbal and written instruction to review signs/symptoms of diabetes, desired ranges of glucose level fasting, after meals and with exercise. Acknowledge that pre and post exercise glucose checks will be done for 3 sessions at entry of program.   Know Your Numbers and Heart Failure: - Group verbal and visual instruction to discuss disease risk factors for cardiac and pulmonary disease and treatment options.  Reviews associated critical values for Overweight/Obesity, Hypertension, Cholesterol, and Diabetes.  Discusses basics of heart failure: signs/symptoms and treatments.  Introduces Heart Failure Zone chart for action plan for heart failure.  Written material  given at graduation. Flowsheet Row Pulmonary Rehab from 10/25/2020 in [facility]  Cardiac and Pulmonary Rehab  Date 09/18/20  Educator [facility]  Instruction Review Code 1- Verbalizes Understanding      Core Components/Risk Factors/Patient Goals Review:   Goals and Risk Factor Review    Row Name 09/11/20 1011 10/04/20 1029 10/30/20 1006         Core Components/Risk Factors/Patient Goals Review   Personal Goals Review Improve shortness of breath with ADL's;Develop more efficient breathing techniques such as purse lipped breathing and diaphragmatic breathing and practicing self-pacing with activity. Improve shortness of breath with ADL's;Weight Management/Obesity Improve shortness of breath with ADL's;Weight Management/Obesity     Review She reports her shortness of breath has gotten better since her pnemonia - practices PLB and breathing exercises. She is working on self pacing during class - will talk about home exercise with EP. She is not on any BP medication - she does not have any hypertension at this time. Resting BP today 116/58. Selicia's weight is up a little.  She is trying to get back to 93 lb, but her eating is not where she wants it to be.  This week her appetite is not where she wants it to be. Her breathing is getting better overall and she is able to do more at home. Pressures continue to do well. Neriyah's weight is down again at 84 pounds.  She is trying to get back to 93 lb, but her eating is not where she wants it to be, her appetite is worse. Reviewed high kcal and high protein nutrition with Barnett Applebaum and she will address it with her doctor this week. I suggested she see an RD who can follow her more closely and who can prescibe supplemental feedings to her oral intake.  Her breathing is getting better overall and she is able to do more at home.     Expected Outcomes ST: Continue to practice breathing techniques LT: talk to EP about home exercise Short: Continue to work on weight gain Long; Continue to monitor risk factors. Short: Continue to work on weight gain Long;  Continue to monitor risk factors.            Core Components/Risk Factors/Patient Goals at Discharge (Final Review):   Goals and Risk Factor Review - 10/30/20 1006      Core Components/Risk Factors/Patient Goals Review   Personal Goals Review Improve shortness of breath with ADL's;Weight Management/Obesity    Review Breda's weight is down again at 84 pounds.  She is trying to get back to 93 lb, but her eating is not where she wants it to be, her appetite is worse. Reviewed high kcal and high protein nutrition with Barnett Applebaum and she will address it with her doctor this week. I suggested she see an RD who can follow her more closely and who can prescibe supplemental feedings to her oral intake.  Her breathing is getting better overall and she is able to do more at home.    Expected Outcomes Short: Continue to work on weight gain Long; Continue to monitor risk factors.           ITP Comments:  ITP Comments    Row Name 07/31/20 0950 08/02/20 1607 08/21/20 0954 08/29/20 0657 09/26/20 1021   ITP Comments Virtual Visit completed. Patient informed on EP and RD appointment and 6 Minute walk test. Patient also informed of patient health questionnaires on My Chart. Patient Verbalizes understanding. Visit diagnosis can be found in [facility]  07/26/2020. Completed 6MWT and gym orientation. Initial ITP created and sent for review to Dr. Emily Filbert, Medical Director. First full day of exercise!  Patient was oriented to gym and equipment including functions, settings, policies, and procedures.  Patient's individual exercise prescription and treatment plan were reviewed.  All starting workloads were established based on the results of the 6 minute walk test done at initial orientation visit.  The plan for exercise progression was also introduced and progression will be customized based on patient's performance and goals. 30 Day review completed. Medical Director ITP review done, changes made as directed, and signed  approval by Medical Director. 30 Day review completed. Medical Director ITP review done, changes made as directed, and signed approval by Medical Director.   Conrad Name 10/08/20 1342 10/17/20 1248 10/24/20 0604 11/01/20 1029 11/21/20 0927   ITP Comments Barnett Applebaum called to let us know that she saw her doctor for her sciatica pain and he felt that when she tried the 3 lb weights, she may have pulled something and encouraged her to rest a week.  We also encouraged her to stretch and ice (if able) to help as well.  She will keep [place] posted on next week. Noura has been out since last review. 30 Day review completed. Medical Director ITP review done, changes made as directed, and signed approval by Medical Director. Returned to program yesterday.  comcerned about her spinal stenosis and exercising without aggravating sciatica. Got her started back slowly with instruction to stop as needed. Fleeta came in today after her MD appointment to let us know that she was advised to drop from the program to focus on weight loss. Lauralynn reports that the MD read my note with her and that he will be sending her to an RD. He is keeping her on Megace to help with her appetite. We will put her on a medical hold for now and see how her weight is improving to see when we can bring her back; we will reassess in a couple of months, told her to keep [place] updated. 30 Day review completed. Medical Director ITP review done, changes made as directed, and signed approval by Medical Director.   Ness Name 12/19/20 [phone] 12/19/20 1337         ITP Comments 30 Day review completed. Medical Director ITP review done, changes made as directed, and signed approval by Medical Director. Cyera continues to lose weight - she is supposed to see a dietitian 01/07/21. We will discharge her at this time, when she feels ready she can get a new referral.             Comments: Discharge ITP

## 2020-12-19 NOTE — Progress Notes (Signed)
Discharge Progress Report  Patient Details  Name: Renee Cabrera MRN: [id number] Date of Birth: November 23, 1940 Referring Provider:   Flowsheet Row Pulmonary Rehab from 08/02/2020 in [facility] Cardiac and Pulmonary Rehab  Referring Provider Ottie Glazier MD       Number of Visits: 15   Reason for Discharge:  Early Exit:  Personal  Smoking History:  Social History   Tobacco Use  Smoking Status Never Smoker  Smokeless Tobacco Never Used    Diagnosis:  Uncomplicated asthma, unspecified asthma severity, unspecified whether persistent  ADL UCSD:  Pulmonary Assessment Scores    Row Name 08/02/20 1609         ADL UCSD   ADL Phase Entry     SOB Score total 17     Rest 0     Walk 1     Stairs 3     Bath 0     Dress 0     Shop 0           CAT Score   CAT Score 8           mMRC Score   mMRC Score 1            Initial Exercise Prescription:  Initial Exercise Prescription - 08/02/20 1600      Date of Initial Exercise RX and Referring Provider   Date 08/02/20    Referring Provider Ottie Glazier MD      Treadmill   MPH 2.3    Grade 0.5    Minutes 15    METs 2.92      Recumbant Bike   Level 2    RPM 60    Watts 10    Minutes 15    METs 2.69      NuStep   Level 3    SPM 80    Minutes 15    METs 2.69      Prescription Details   Frequency (times per week) 2    Duration Progress to 30 minutes of continuous aerobic without signs/symptoms of physical distress      Intensity   THRR 40-80% of Max Heartrate 107-129    Ratings of Perceived Exertion 11-13    Perceived Dyspnea 0-4      Resistance Training   Training Prescription Yes           Discharge Exercise Prescription (Final Exercise Prescription Changes):  Exercise Prescription Changes - 10/30/20 1600      Response to Exercise   Blood Pressure (Admit) 128/72    Blood Pressure (Exercise) 128/60    Blood Pressure (Exit) 112/62    Heart Rate (Admit) 87 bpm    Heart Rate (Exercise) 97 bpm     Heart Rate (Exit) 83 bpm    Oxygen Saturation (Admit) 98 %    Oxygen Saturation (Exercise) 93 %    Oxygen Saturation (Exit) 98 %    Rating of Perceived Exertion (Exercise) 15    Perceived Dyspnea (Exercise) 0    Comments none    Duration Continue with 30 min of aerobic exercise without signs/symptoms of physical distress.    Intensity THRR unchanged      Progression   Progression Continue to progress workloads to maintain intensity without signs/symptoms of physical distress.    Average METs 2      Resistance Training   Training Prescription Yes    Weight 1 lb    Reps 10-15      Treadmill   MPH 1.2  Grade 0.5    Minutes 15    METs 2      Recumbant Bike   Level 2    Minutes 15      NuStep   Level 3    Minutes 15    METs 2      Home Exercise Plan   Plans to continue exercise at Home (comment)   walking and staff videos   Frequency Add 2 additional days to program exercise sessions.    Initial Home Exercises Provided 10/02/20           Functional Capacity:  6 Minute Walk    Row Name 08/02/20 1612         6 Minute Walk   Phase Initial     Distance 1195 feet     Walk Time 6 minutes     # of Rest Breaks 9     MPH 2.26     METS 2.69     RPE 9     Perceived Dyspnea  0     VO2 Peak 9.44     Symptoms No     Resting HR 85 bpm     Resting BP 132/78     Resting Oxygen Saturation  98 %     Exercise Oxygen Saturation  during 6 min walk 93 %     Max Ex. HR 100 bpm     Max Ex. BP 158/82     2 Minute Post BP 136/80           Interval HR   1 Minute HR 94     2 Minute HR 95     3 Minute HR 99     4 Minute HR 96     5 Minute HR 97     6 Minute HR 100     2 Minute Post HR 82     Interval Heart Rate? Yes           Interval Oxygen   Interval Oxygen? Yes     Baseline Oxygen Saturation % 98 %     1 Minute Oxygen Saturation % 96 %     1 Minute Liters of Oxygen 0 L  RA     2 Minute Oxygen Saturation % 96 %     2 Minute Liters of Oxygen 0 L     3 Minute  Oxygen Saturation % 93 %     3 Minute Liters of Oxygen 0 L     4 Minute Oxygen Saturation % 94 %     4 Minute Liters of Oxygen 0 L     5 Minute Oxygen Saturation % 96 %     5 Minute Liters of Oxygen 0 L     6 Minute Oxygen Saturation % 93 %     6 Minute Liters of Oxygen 0 L     2 Minute Post Oxygen Saturation % 97 %     2 Minute Post Liters of Oxygen 0 L            Psychological, QOL, Others - Outcomes: PHQ 2/9: Depression screen PHQ 2/9 08/02/2020  Decreased Interest 0  Down, Depressed, Hopeless 0  PHQ - 2 Score 0  Altered sleeping 0  Tired, decreased energy 1  Change in appetite 1  Feeling bad or failure about yourself  0  Trouble concentrating 0  Moving slowly or fidgety/restless 0  Suicidal thoughts 0  PHQ-9 Score 2  Difficult doing work/chores  Not difficult at all    Quality of Life:   Nutrition & Weight - Outcomes:    Nutrition:  Nutrition Therapy & Goals - 09/11/20 0947      Nutrition Therapy   Diet Pulmonary MNT, high kcal, high protien    Protein (specify units) 57g    Fiber 25 grams    Whole Grain Foods 3 servings    Saturated Fats 12 max. grams    Fruits and Vegetables 5 servings/day    Sodium 1.5 grams      Personal Nutrition Goals   Nutrition Goal ST: add boost 2x/day, add fat and protein to meals (mashed potatoes with butter and cream with beans for a snack, nuts or nut butter and milk to oatmeal for example) LT: gain weight - 1st goal 100 lbs.    Comments Have not completed initial evaluation, however, did do one on one education with patient. She has continuously been losing weight and is at 88 pounds. She also reports needing more iron. Discussed heart healthy eating, high calorie and high protein while still eating heart healthy. She is very interested in power bowls and loves fruit and vegetables. 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 She doesn't normally eat breakfast. She eats whatever she feels like at that time. She  loves pasta - she is having altered taste. She reports having small meals. She reports eating a lot of eggs and cheese. 2 meals she will have a good protein source. She adds fats to her meals like bacon, olive oil, butter. She reports loving potatoes. She has about a fistful of food per meal. She reports getting a medication to help with appetite - her appetite has improved some. Discussed adding in boost 2x/day. Discussed heart healthy eating and MNT to gain weight. Small frequent meals, boost, protein at all meals, added fat to meals.      Intervention Plan   Intervention Prescribe, educate and counsel regarding individualized specific dietary modifications aiming towards targeted core components such as weight, hypertension, lipid management, diabetes, heart failure and other comorbidities.;Nutrition handout(s) given to patient.    Expected Outcomes Short Term Goal: Understand basic principles of dietary content, such as calories, fat, sodium, cholesterol and nutrients.;Short Term Goal: A plan has been developed with personal nutrition goals set during dietitian appointment.;Long Term Goal: Adherence to prescribed nutrition plan.           Nutrition Discharge:  Nutrition Assessments - 08/02/20 1609      MEDFICTS Scores   Pre Score 66           Education Questionnaire Score:  Knowledge Questionnaire Score - 08/02/20 1611      Knowledge Questionnaire Score   Pre Score 14/18: Oxygen, ADLs,           Goals reviewed with patient; copy given to patient.

## 2020-12-19 NOTE — Progress Notes (Signed)
Pulmonary Individual Treatment Plan  Patient Details  Name: Renee Cabrera MRN: [id number] Date of Birth: 02/17/1941 Referring Provider:   Flowsheet Row Pulmonary Rehab from 08/02/2020 in [facility] Cardiac and Pulmonary Rehab  Referring Provider Ottie Glazier MD      Initial Encounter Date:  Flowsheet Row Pulmonary Rehab from 08/02/2020 in [facility] Cardiac and Pulmonary Rehab  Date 08/02/20      Visit Diagnosis: Uncomplicated asthma, unspecified asthma severity, unspecified whether persistent  Patient's Home Medications on Admission:  Current Outpatient Medications:  .  albuterol (VENTOLIN HFA) 108 (90 Base) MCG/ACT inhaler, Inhale into the lungs., Disp: , Rfl:  .  ascorbic Acid (VITAMIN C) 500 MG CPCR, Take by mouth., Disp: , Rfl:  .  aspirin EC 81 MG tablet, Take 81 mg by mouth daily., Disp: , Rfl:  .  BELSOMRA 10 MG TABS, Take 10 mg by mouth at bedtime. , Disp: , Rfl: 5 .  Calcium Carb-Cholecalciferol (CALTRATE 600+D3 PO), Take 1 tablet by mouth 2 (two) times daily., Disp: , Rfl:  .  Calcium Carbonate-Vitamin D 600-400 MG-UNIT tablet, Take by mouth., Disp: , Rfl:  .  cholecalciferol (VITAMIN D) 1000 UNITS tablet, Take 1,000 Units by mouth every evening. , Disp: , Rfl:  .  citalopram (CELEXA) 20 MG tablet, Take 20 mg by mouth daily at 8 pm. (1900), Disp: , Rfl:  .  citalopram (CELEXA) 20 MG tablet, Take 1 tablet by mouth daily., Disp: , Rfl:  .  clobetasol (TEMOVATE) 0.05 % external solution, MIX IN A TUB OF CERAVE CREAM AND USE EVERYDAY UP TO TWICE DAILY ON ITCHY RASH UNTIL CLEAR, AVOID FACE, GROIN, AXILLAA., Disp: 50 mL, Rfl: 0 .  cyanocobalamin (,VITAMIN B-12,) 1000 MCG/ML injection, Inject 1,000 mcg into the muscle every 30 (thirty) days. , Disp: , Rfl:  .  denosumab (PROLIA) 60 MG/ML SOSY injection, Inject 60 mg into the skin every 6 (six) months. , Disp: , Rfl:  .  Fe Fum-FA-B Cmp-C-Zn-Mg-Mn-Cu (HEMATINIC PLUS VIT/MINERALS) 106-1 MG TABS, Take 1 tablet by mouth daily with lunch. ,  Disp: , Rfl: 3 .  Fe Fum-FA-B Cmp-C-Zn-Mg-Mn-Cu (HEMATINIC PLUS VIT/MINERALS) 106-1 MG TABS, Take 1 tablet by mouth daily., Disp: , Rfl:  .  gabapentin (NEURONTIN) 100 MG capsule, Take 100-200 mg by mouth See admin instructions. Take 1 capsule (100 mg) by mouth in the morning & take 2 capsules (200 mg) by mouth at night., Disp: , Rfl:  .  HYDROcodone-acetaminophen (NORCO/VICODIN) 5-325 MG tablet, Take 1 tablet by mouth every 4 (four) hours as needed (pain.)., Disp: , Rfl:  .  ketoconazole (NIZORAL) 2 % shampoo, Apply 1 application topically daily. massage into scalp and leave in for 10 minutes before rinsing out, Disp: 120 mL, Rfl: 4 .  megestrol (MEGACE) 40 MG/ML suspension, Take 800 mg by mouth daily., Disp: , Rfl:  .  megestrol (MEGACE) 40 MG/ML suspension, Take by mouth., Disp: , Rfl:  .  meloxicam (MOBIC) 7.5 MG tablet, Take 7.5 mg by mouth daily with supper. , Disp: , Rfl:  .  meloxicam (MOBIC) 7.5 MG tablet, Take 1 tablet by mouth daily., Disp: , Rfl:  .  mirtazapine (REMERON) 30 MG tablet, Take 30 mg by mouth at bedtime., Disp: , Rfl:  .  mirtazapine (REMERON) 45 MG tablet, Take 1 tablet by mouth at bedtime., Disp: , Rfl:  .  mirtazapine (REMERON) 45 MG tablet, , Disp: , Rfl:  .  Multiple Vitamins-Minerals (PRESERVISION AREDS) TABS, Take 1 tablet by mouth 2 (  two) times daily. , Disp: , Rfl:  .  pantoprazole (PROTONIX) 40 MG tablet, Take 40 mg by mouth daily before supper. , Disp: , Rfl: 1 .  pantoprazole (PROTONIX) 40 MG tablet, Take 1 tablet by mouth daily., Disp: , Rfl:  .  Suvorexant (BELSOMRA) 10 MG TABS, Take 1 tablet by mouth daily., Disp: , Rfl:  .  Travoprost, BAK Free, (TRAVATAN) 0.004 % SOLN ophthalmic solution, SMARTSIG:1 Drop(s) In Eye(s) Every Evening, Disp: , Rfl:  .  travoprost, benzalkonium, (TRAVATAN) 0.004 % ophthalmic solution, Place 1 drop into both eyes at bedtime., Disp: , Rfl:  .  vitamin C (ASCORBIC ACID) 500 MG tablet, Take 500 mg by mouth 2 (two) times daily.,  Disp: , Rfl:   Past Medical History: Past Medical History:  Diagnosis Date  . Anemia    IRON INFUSIONS  . Arthritis    osteoarthritis/ RA  . Asthma    remote h/o asthma  . Depression    controlled  . H/O hypoglycemia   . H/O: GI bleed   . Headache(784.0)    h/o migraines  . Hearing loss, bilateral    wears hearing aides  . History of blood transfusion    pt states she had yellow jaundice  . Hypertension    H/O NO TX NOW  . Lupus (systemic lupus erythematosus) ([place]) 1995   in remission since 2005  . Nasal sinus congestion   . Osteoporosis with fracture   . Stress fracture of foot    left foot  . Vitamin B 12 deficiency     Tobacco Use: Social History   Tobacco Use  Smoking Status Never Smoker  Smokeless Tobacco Never Used    Labs: Recent Review Flowsheet Data   There is no flowsheet data to display.      Pulmonary Assessment Scores:  Pulmonary Assessment Scores    Row Name 08/02/20 1609         ADL UCSD   ADL Phase Entry     SOB Score total 17     Rest 0     Walk 1     Stairs 3     Bath 0     Dress 0     Shop 0           CAT Score   CAT Score 8           mMRC Score   mMRC Score 1            UCSD: Self-administered rating of dyspnea associated with activities of daily living (ADLs) 6-point scale (0 = "not at all" to 5 = "maximal or unable to do because of breathlessness")  Scoring Scores range from 0 to 120.  Minimally important difference is 5 units  CAT: CAT can identify the health impairment of COPD patients and is better correlated with disease progression.  CAT has a scoring range of zero to 40. The CAT score is classified into four groups of low (less than 10), medium (10 - 20), high (21-30) and very high (31-40) based on the impact level of disease on health status. A CAT score over 10 suggests significant symptoms.  A worsening CAT score could be explained by an exacerbation, poor medication adherence, poor inhaler technique, or  progression of COPD or comorbid conditions.  CAT MCID is 2 points  mMRC: mMRC (Modified Medical Research Council) Dyspnea Scale is used to assess the degree of baseline functional disability in patients of respiratory disease due to  dyspnea. No minimal important difference is established. A decrease in score of 1 point or greater is considered a positive change.   Pulmonary Function Assessment:  Pulmonary Function Assessment - 07/31/20 0940      Breath   Shortness of Breath Yes;Limiting activity           Exercise Target Goals: Exercise Program Goal: Individual exercise prescription set using results from initial 6 min walk test and THRR while considering  patient's activity barriers and safety.   Exercise Prescription Goal: Initial exercise prescription builds to 30-45 minutes a day of aerobic activity, 2-3 days per week.  Home exercise guidelines will be given to patient during program as part of exercise prescription that the participant will acknowledge.  Education: Aerobic Exercise: - Group verbal and visual presentation on the components of exercise prescription. Introduces F.I.T.T principle from ACSM for exercise prescriptions.  Reviews F.I.T.T. principles of aerobic exercise including progression. Written material given at graduation. Flowsheet Row Pulmonary Rehab from 10/25/2020 in [facility] Cardiac and Pulmonary Rehab  Date 10/25/20  Hilda Blades only on 10/28]  Educator AS  Instruction Review Code 1- Verbalizes Understanding      Education: Resistance Exercise: - Group verbal and visual presentation on the components of exercise prescription. Introduces F.I.T.T principle from ACSM for exercise prescriptions  Reviews F.I.T.T. principles of resistance exercise including progression. Written material given at graduation.    Education: Exercise & Equipment Safety: - Individual verbal instruction and demonstration of equipment use and safety with use of the equipment. Flowsheet  Row Pulmonary Rehab from 10/25/2020 in [facility] Cardiac and Pulmonary Rehab  Date 07/31/20  Educator [facility]  Instruction Review Code 1- Verbalizes Understanding      Education: Exercise Physiology & General Exercise Guidelines: - Group verbal and written instruction with models to review the exercise physiology of the cardiovascular system and associated critical values. Provides general exercise guidelines with specific guidelines to those with heart or lung disease.    Education: Flexibility, Balance, Mind/Body Relaxation: - Group verbal and visual presentation with interactive activity on the components of exercise prescription. Introduces F.I.T.T principle from ACSM for exercise prescriptions. Reviews F.I.T.T. principles of flexibility and balance exercise training including progression. Also discusses the mind body connection.  Reviews various relaxation techniques to help reduce and manage stress (i.e. Deep breathing, progressive muscle relaxation, and visualization). Balance handout provided to take home. Written material given at graduation. Flowsheet Row Pulmonary Rehab from 10/25/2020 in [facility] Cardiac and Pulmonary Rehab  Date 08/30/20  Educator AS  Instruction Review Code 1- Verbalizes Understanding      Activity Barriers & Risk Stratification:  Activity Barriers & Cardiac Risk Stratification - 08/02/20 1620      Activity Barriers & Cardiac Risk Stratification   Activity Barriers Other (comment)    Comments 'Rod" placed in right leg           6 Minute Walk:  6 Minute Walk    Row Name 08/02/20 1612         6 Minute Walk   Phase Initial     Distance 1195 feet     Walk Time 6 minutes     # of Rest Breaks 9     MPH 2.26     METS 2.69     RPE 9     Perceived Dyspnea  0     VO2 Peak 9.44     Symptoms No     Resting HR 85 bpm     Resting BP 132/78  Resting Oxygen Saturation  98 %     Exercise Oxygen Saturation  during 6 min walk 93 %     Max Ex. HR 100 bpm     Max  Ex. BP 158/82     2 Minute Post BP 136/80           Interval HR   1 Minute HR 94     2 Minute HR 95     3 Minute HR 99     4 Minute HR 96     5 Minute HR 97     6 Minute HR 100     2 Minute Post HR 82     Interval Heart Rate? Yes           Interval Oxygen   Interval Oxygen? Yes     Baseline Oxygen Saturation % 98 %     1 Minute Oxygen Saturation % 96 %     1 Minute Liters of Oxygen 0 L  RA     2 Minute Oxygen Saturation % 96 %     2 Minute Liters of Oxygen 0 L     3 Minute Oxygen Saturation % 93 %     3 Minute Liters of Oxygen 0 L     4 Minute Oxygen Saturation % 94 %     4 Minute Liters of Oxygen 0 L     5 Minute Oxygen Saturation % 96 %     5 Minute Liters of Oxygen 0 L     6 Minute Oxygen Saturation % 93 %     6 Minute Liters of Oxygen 0 L     2 Minute Post Oxygen Saturation % 97 %     2 Minute Post Liters of Oxygen 0 L           Oxygen Initial Assessment:  Oxygen Initial Assessment - 08/02/20 1609      Home Oxygen   Home Oxygen Device None    Sleep Oxygen Prescription None    Home Exercise Oxygen Prescription None    Home Resting Oxygen Prescription None      Initial 6 min Walk   Oxygen Used None      Program Oxygen Prescription   Program Oxygen Prescription None      Intervention   Short Term Goals To learn and understand importance of monitoring SPO2 with pulse oximeter and demonstrate accurate use of the pulse oximeter.;To learn and understand importance of maintaining oxygen saturations>88%;To learn and demonstrate proper pursed lip breathing techniques or other breathing techniques.;To learn and demonstrate proper use of respiratory medications    Long  Term Goals Verbalizes importance of monitoring SPO2 with pulse oximeter and return demonstration;Maintenance of O2 saturations>88%;Exhibits proper breathing techniques, such as pursed lip breathing or other method taught during program session;Compliance with respiratory medication;Demonstrates proper use  of MDI's           Oxygen Re-Evaluation:  Oxygen Re-Evaluation    Row Name 08/21/20 0955 09/11/20 0958 10/04/20 1030 10/30/20 1003       Program Oxygen Prescription   Program Oxygen Prescription None None None None         Home Oxygen   Home Oxygen Device None None None None    Sleep Oxygen Prescription None None None None    Home Exercise Oxygen Prescription None None None None    Home Resting Oxygen Prescription None None None None         Goals/Expected Outcomes  Short Term Goals To learn and understand importance of monitoring SPO2 with pulse oximeter and demonstrate accurate use of the pulse oximeter.;To learn and understand importance of maintaining oxygen saturations>88%;To learn and demonstrate proper pursed lip breathing techniques or other breathing techniques.;To learn and demonstrate proper use of respiratory medications To learn and understand importance of monitoring SPO2 with pulse oximeter and demonstrate accurate use of the pulse oximeter.;To learn and understand importance of maintaining oxygen saturations>88%;To learn and demonstrate proper pursed lip breathing techniques or other breathing techniques.;To learn and demonstrate proper use of respiratory medications To learn and understand importance of monitoring SPO2 with pulse oximeter and demonstrate accurate use of the pulse oximeter.;To learn and understand importance of maintaining oxygen saturations>88%;To learn and demonstrate proper pursed lip breathing techniques or other breathing techniques.;To learn and demonstrate proper use of respiratory medications To learn and understand importance of monitoring SPO2 with pulse oximeter and demonstrate accurate use of the pulse oximeter.;To learn and understand importance of maintaining oxygen saturations>88%;To learn and demonstrate proper pursed lip breathing techniques or other breathing techniques.;To learn and demonstrate proper use of respiratory medications     Long  Term Goals Verbalizes importance of monitoring SPO2 with pulse oximeter and return demonstration;Maintenance of O2 saturations>88%;Exhibits proper breathing techniques, such as pursed lip breathing or other method taught during program session;Compliance with respiratory medication;Demonstrates proper use of MDI's Verbalizes importance of monitoring SPO2 with pulse oximeter and return demonstration;Maintenance of O2 saturations>88%;Exhibits proper breathing techniques, such as pursed lip breathing or other method taught during program session;Compliance with respiratory medication;Demonstrates proper use of MDI's Verbalizes importance of monitoring SPO2 with pulse oximeter and return demonstration;Maintenance of O2 saturations>88%;Exhibits proper breathing techniques, such as pursed lip breathing or other method taught during program session;Compliance with respiratory medication;Demonstrates proper use of MDI's Verbalizes importance of monitoring SPO2 with pulse oximeter and return demonstration;Maintenance of O2 saturations>88%;Exhibits proper breathing techniques, such as pursed lip breathing or other method taught during program session;Compliance with respiratory medication;Demonstrates proper use of MDI's    Comments Reviewed PLB technique with pt.  Talked about how it works and it's importance in maintaining their exercise saturations. She is on alberterol and uses the lung exercise equipment. She reports using PLB and feels that is more natural. She hasn't had to use her inhaler and the cold weather has not been affecting her breathing. [place] is doing better with her breathing.  She is using her PLB at home and finds it helpful.  She going to get a pulse oximeter for home use.  She is also using her inspirometer. to help with breathings.  Her meds seem to be doing well for her. She has an inspirometer that she uses at home. She continues with her PLB and reports it helps both with breathing and with  her leg. She reports her breathing has been improving, but is having difficulty with the cold weather. She still does not have a pulse ox, but would like to get one.    Goals/Expected Outcomes Short: Become more profiecient at using PLB.   Long: Become independent at using PLB. Short: continue with lung exercises and practicing PLB.  Long: Become independent at using PLB. Short: Get pulse oximeter for home use  Long; Continue to use PLB. Short: Get pulse oximeter for home use  Long; Continue to use PLB.           Oxygen Discharge (Final Oxygen Re-Evaluation):  Oxygen Re-Evaluation - 10/30/20 1003      Program Oxygen Prescription  Program Oxygen Prescription None      Home Oxygen   Home Oxygen Device None    Sleep Oxygen Prescription None    Home Exercise Oxygen Prescription None    Home Resting Oxygen Prescription None      Goals/Expected Outcomes   Short Term Goals To learn and understand importance of monitoring SPO2 with pulse oximeter and demonstrate accurate use of the pulse oximeter.;To learn and understand importance of maintaining oxygen saturations>88%;To learn and demonstrate proper pursed lip breathing techniques or other breathing techniques.;To learn and demonstrate proper use of respiratory medications    Long  Term Goals Verbalizes importance of monitoring SPO2 with pulse oximeter and return demonstration;Maintenance of O2 saturations>88%;Exhibits proper breathing techniques, such as pursed lip breathing or other method taught during program session;Compliance with respiratory medication;Demonstrates proper use of MDI's    Comments She has an inspirometer that she uses at home. She continues with her PLB and reports it helps both with breathing and with her leg. She reports her breathing has been improving, but is having difficulty with the cold weather. She still does not have a pulse ox, but would like to get one.    Goals/Expected Outcomes Short: Get pulse oximeter for home  use  Long; Continue to use PLB.           Initial Exercise Prescription:  Initial Exercise Prescription - 08/02/20 1600      Date of Initial Exercise RX and Referring Provider   Date 08/02/20    Referring Provider Ottie Glazier MD      Treadmill   MPH 2.3    Grade 0.5    Minutes 15    METs 2.92      Recumbant Bike   Level 2    RPM 60    Watts 10    Minutes 15    METs 2.69      NuStep   Level 3    SPM 80    Minutes 15    METs 2.69      Prescription Details   Frequency (times per week) 2    Duration Progress to 30 minutes of continuous aerobic without signs/symptoms of physical distress      Intensity   THRR 40-80% of Max Heartrate 107-129    Ratings of Perceived Exertion 11-13    Perceived Dyspnea 0-4      Resistance Training   Training Prescription Yes           Perform Capillary Blood Glucose checks as needed.  Exercise Prescription Changes:  Exercise Prescription Changes    Row Name 08/02/20 1600 08/23/20 1100 09/06/20 0700 09/19/20 1200 10/03/20 1000     Response to Exercise   Blood Pressure (Admit) 132/78 142/88 130/76 114/68 118/76   Blood Pressure (Exercise) 158/82 148/64 124/66 140/76 142/80   Blood Pressure (Exit) 130/78 110/60 104/68 106/68 118/72   Heart Rate (Admit) 85 bpm 83 bpm 85 bpm 80 bpm 87 bpm   Heart Rate (Exercise) 100 bpm 100 bpm 97 bpm 107 bpm 101 bpm   Heart Rate (Exit) 82 bpm 80 bpm 85 bpm 95 bpm 87 bpm   Oxygen Saturation (Admit) 98 % 97 % 96 % 98 % 98 %   Oxygen Saturation (Exercise) 93 % 95 % 98 % 96 % 96 %   Oxygen Saturation (Exit) 98 % 99 % 98 % 98 % 97 %   Rating of Perceived Exertion (Exercise) 9 11 11 11 11    Perceived Dyspnea (Exercise) 0  1 0 0 0   Symptoms none -- -- -- --   Comments walk test results first day none none none   Duration Progress to 30 minutes of  aerobic without signs/symptoms of physical distress -- Progress to 30 minutes of  aerobic without signs/symptoms of physical distress Continue with 30  min of aerobic exercise without signs/symptoms of physical distress. Continue with 30 min of aerobic exercise without signs/symptoms of physical distress.   Intensity -- -- THRR unchanged THRR unchanged THRR unchanged     Progression   Progression -- -- Continue to progress workloads to maintain intensity without signs/symptoms of physical distress. Continue to progress workloads to maintain intensity without signs/symptoms of physical distress. Continue to progress workloads to maintain intensity without signs/symptoms of physical distress.   Average METs -- -- 2.2 2 2.05     Resistance Training   Training Prescription -- Yes Yes Yes Yes   Weight 3 lb 1 lb 1 lb 1 lb 1 lb   Reps 10-15 10-15 10-15 10-15 10-15     Interval Training   Interval Training -- -- No No No     Treadmill   MPH -- 0.7 1.2 1.2 1.8   Grade -- 0 0.5 0.5 0.5   Minutes -- 15 15 15 15    METs -- 1.54 2 2 2.5     Recumbant Bike   Level -- -- 2 -- --   Minutes -- -- 15 -- --     NuStep   Level -- 3 3 3 3    SPM -- 80 -- 80 --   Minutes -- 15 15 15 15    METs -- 1.2 2.4 1.9 1.6   Row Name 10/30/20 1600             Response to Exercise   Blood Pressure (Admit) 128/72       Blood Pressure (Exercise) 128/60       Blood Pressure (Exit) 112/62       Heart Rate (Admit) 87 bpm       Heart Rate (Exercise) 97 bpm       Heart Rate (Exit) 83 bpm       Oxygen Saturation (Admit) 98 %       Oxygen Saturation (Exercise) 93 %       Oxygen Saturation (Exit) 98 %       Rating of Perceived Exertion (Exercise) 15       Perceived Dyspnea (Exercise) 0       Comments none       Duration Continue with 30 min of aerobic exercise without signs/symptoms of physical distress.       Intensity THRR unchanged               Progression   Progression Continue to progress workloads to maintain intensity without signs/symptoms of physical distress.       Average METs 2               Resistance Training   Training Prescription Yes        Weight 1 lb       Reps 10-15               Treadmill   MPH 1.2       Grade 0.5       Minutes 15       METs 2               Recumbant Bike   Level  2       Minutes 15               NuStep   Level 3       Minutes 15       METs 2               Home Exercise Plan   Plans to continue exercise at Home (comment)  walking and staff videos       Frequency Add 2 additional days to program exercise sessions.       Initial Home Exercises Provided 10/02/20              Exercise Comments:  Exercise Comments    Row Name 10/08/20 1342 10/24/20 0606         Exercise Comments Katrenia called to let us know that she saw her doctor for her sciatica pain and he felt that when she tried the 3 lb weights, she may have pulled something and encouraged her to rest a week.  We also encouraged her to stretch and ice (if able) to help as well.  She will keep [place] posted on next week. Returned to program yesterday.  comcerned about her spinal stenosis and exercising without aggravating sciatica. Got her started back slowly with instruction to stop as needed.             Exercise Goals and Review:  Exercise Goals    Row Name 08/02/20 1630             Exercise Goals   Increase Physical Activity Yes       Intervention Provide advice, education, support and counseling about physical activity/exercise needs.;Develop an individualized exercise prescription for aerobic and resistive training based on initial evaluation findings, risk stratification, comorbidities and participant's personal goals.       Expected Outcomes Short Term: Attend rehab on a regular basis to increase amount of physical activity.;Long Term: Add in home exercise to make exercise part of routine and to increase amount of physical activity.;Long Term: Exercising regularly at least 3-5 days a week.       Increase Strength and Stamina Yes       Intervention Provide advice, education, support and counseling about physical  activity/exercise needs.;Develop an individualized exercise prescription for aerobic and resistive training based on initial evaluation findings, risk stratification, comorbidities and participant's personal goals.       Expected Outcomes Short Term: Increase workloads from initial exercise prescription for resistance, speed, and METs.;Short Term: Perform resistance training exercises routinely during rehab and add in resistance training at home;Long Term: Improve cardiorespiratory fitness, muscular endurance and strength as measured by increased METs and functional capacity (6MWT)       Able to understand and use rate of perceived exertion (RPE) scale Yes       Intervention Provide education and explanation on how to use RPE scale       Expected Outcomes Short Term: Able to use RPE daily in rehab to express subjective intensity level;Long Term:  Able to use RPE to guide intensity level when exercising independently       Able to understand and use Dyspnea scale Yes       Intervention Provide education and explanation on how to use Dyspnea scale       Expected Outcomes Short Term: Able to use Dyspnea scale daily in rehab to express subjective sense of shortness of breath during exertion;Long Term: Able to use Dyspnea scale to guide intensity  level when exercising independently       Knowledge and understanding of Target Heart Rate Range (THRR) Yes       Intervention Provide education and explanation of THRR including how the numbers were predicted and where they are located for reference       Expected Outcomes Short Term: Able to state/look up THRR;Short Term: Able to use daily as guideline for intensity in rehab;Long Term: Able to use THRR to govern intensity when exercising independently       Able to check pulse independently Yes       Intervention Provide education and demonstration on how to check pulse in carotid and radial arteries.;Review the importance of being able to check your own pulse for  safety during independent exercise       Expected Outcomes Short Term: Able to explain why pulse checking is important during independent exercise;Long Term: Able to check pulse independently and accurately       Understanding of Exercise Prescription Yes       Intervention Provide education, explanation, and written materials on patient's individual exercise prescription       Expected Outcomes Short Term: Able to explain program exercise prescription;Long Term: Able to explain home exercise prescription to exercise independently              Exercise Goals Re-Evaluation :  Exercise Goals Re-Evaluation    [place] Name 08/21/20 0955 09/06/20 0736 09/11/20 1001 09/19/20 1228 10/03/20 1013     Exercise Goal Re-Evaluation   Exercise Goals Review Increase Physical Activity;Able to understand and use rate of perceived exertion (RPE) scale;Knowledge and understanding of Target Heart Rate Range (THRR);Understanding of Exercise Prescription;Increase Strength and Stamina;Able to check pulse independently;Able to understand and use Dyspnea scale Increase Physical Activity;Increase Strength and Stamina;Understanding of Exercise Prescription Increase Physical Activity;Increase Strength and Stamina;Understanding of Exercise Prescription Increase Physical Activity;Increase Strength and Stamina;Understanding of Exercise Prescription Increase Physical Activity;Increase Strength and Stamina;Understanding of Exercise Prescription   Comments Reviewed RPE and dyspnea scales, THR and program prescription with pt today.  Pt voiced understanding and was given a copy of goals to take home. Chassidy is off to a good start in rehab.  She is already up to level 3 on the NuStep.  We will continue to monitor her progress. She walks her dog and goes to the park - 20-30 minutes (3x/week). Can't walk in her neighborhood due to large dogs. She does not do any weighted exercises at home. She has not reviewed home exercise yet. Eleanors  oxygen levels are in 90s during exercise.  She reoprts RPE of 11.  She reaches THR range sometimes.  Staff will encourage increasing levels to reach THR range and review home exercise. Jovanni is doing well in rehab.  She is already up to level 3 on the NuStep.  We will continue to monitor her progress.   Expected Outcomes Short: Use RPE daily to regulate intensity. Long: Follow program prescription in THR. Short: Continue to attend class regularly Long: Continue to follow program prescription ST: meet with EP regarding home exercise LT: continue attending pulmonary rehab consistently Short: review home exercise and THR range Long: build overall stamina Short: try 2 lb weights Long: Continue to improve stamina.   [place] Name 10/04/20 1013 10/30/20 1001 11/26/20 1558         Exercise Goal Re-Evaluation   Exercise Goals Review Increase Physical Activity;Understanding of Exercise Prescription;Increase Strength and Stamina;Able to understand and use rate of perceived exertion (RPE) scale;Able  to understand and use Dyspnea scale;Knowledge and understanding of Target Heart Rate Range (THRR);Able to check pulse independently Increase Physical Activity;Understanding of Exercise Prescription;Increase Strength and Stamina;Able to understand and use rate of perceived exertion (RPE) scale;Able to understand and use Dyspnea scale;Knowledge and understanding of Target Heart Rate Range (THRR);Able to check pulse independently --     Comments Elenor is doing well in rehab.  Her leg has been bothering her and she has an appointment tomorrow to look at her rod again.  She continues to chase her dog and cat each to stay active at home. Reviewed home exercise with pt today.  Pt plans to walk and use staff videos for exercise.  Reviewed THR, pulse, RPE, sign and symptoms, pulse oximetery and when to call 911 or MD.  Also discussed weather considerations and indoor options.  Pt voiced understanding. Armella is back from being out  due to leg pain and is doing well. She continues to stay active with her animals at home. She hasn't tried our workout videos yet due to her leg, she has chores at home she needs to do , but hasn't with leg. she walks her dog in the park when she can. Out on medical hold     Expected Outcomes Short: Start to add in more exercise at home Long: Continue to improve stamina. Short: Start to add in more exercise at home as tolerated with leg pain  Long: Continue to improve stamina. --            Discharge Exercise Prescription (Final Exercise Prescription Changes):  Exercise Prescription Changes - 10/30/20 1600      Response to Exercise   Blood Pressure (Admit) 128/72    Blood Pressure (Exercise) 128/60    Blood Pressure (Exit) 112/62    Heart Rate (Admit) 87 bpm    Heart Rate (Exercise) 97 bpm    Heart Rate (Exit) 83 bpm    Oxygen Saturation (Admit) 98 %    Oxygen Saturation (Exercise) 93 %    Oxygen Saturation (Exit) 98 %    Rating of Perceived Exertion (Exercise) 15    Perceived Dyspnea (Exercise) 0    Comments none    Duration Continue with 30 min of aerobic exercise without signs/symptoms of physical distress.    Intensity THRR unchanged      Progression   Progression Continue to progress workloads to maintain intensity without signs/symptoms of physical distress.    Average METs 2      Resistance Training   Training Prescription Yes    Weight 1 lb    Reps 10-15      Treadmill   MPH 1.2    Grade 0.5    Minutes 15    METs 2      Recumbant Bike   Level 2    Minutes 15      NuStep   Level 3    Minutes 15    METs 2      Home Exercise Plan   Plans to continue exercise at Home (comment)   walking and staff videos   Frequency Add 2 additional days to program exercise sessions.    Initial Home Exercises Provided 10/02/20           Nutrition:  Target Goals: Understanding of nutrition guidelines, daily intake of sodium 1500mg , cholesterol 200mg , calories 30% from  fat and 7% or less from saturated fats, daily to have 5 or more servings of fruits and vegetables.  Education: All  About Nutrition: -Group instruction provided by verbal, written material, interactive activities, discussions, models, and posters to present general guidelines for heart healthy nutrition including fat, fiber, MyPlate, the role of sodium in heart healthy nutrition, utilization of the nutrition label, and utilization of this knowledge for meal planning. Follow up email sent as well. Written material given at graduation. Flowsheet Row Pulmonary Rehab from 10/25/2020 in [facility] Card[facility] and Pulmonary Rehab  Date 09/06/20  Educator [facility]  Instruction Review Code 1- Verbalizes Understanding      Biometrics:    Nutrition Therapy Plan and Nutrition Goals:  Nutrition Therapy & Goals - 09/11/20 0947      Nutrition Therapy   Diet Pulmonary MNT, high kcal, high protien    Protein (specify units) 57g    Fiber 25 grams    Whole Grain Foods 3 servings    Saturated Fats 12 max. grams    Fruits and Vegetables 5 servings/day    Sodium 1.5 grams      Personal Nutrition Goals   Nutrition Goal ST: add boost 2x/day, add fat and protein to meals (mashed potatoes with butter and cream with beans for a snack, nuts or nut butter and milk to oatmeal for example) LT: gain weight - 1st goal 100 lbs.    Comments Have not completed initial evaluation, however, did do one on one education with patient. She has continuously been losing weight and is at 88 pounds. She also reports needing more iron. Discussed heart healthy eating, high calorie and high protein while still eating heart healthy. She is very interested in power bowls and loves fruit and vegetables. 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 She doesn't normally eat breakfast. She eats whatever she feels like at that time. She loves pasta - she is having altered taste. She reports having small meals. She reports eating a lot of  eggs and cheese. 2 meals she will have a good protein source. She adds fats to her meals like bacon, olive oil, butter. She reports loving potatoes. She has about a fistful of food per meal. She reports getting a medication to help with appetite - her appetite has improved some. Discussed adding in boost 2x/day. Discussed heart healthy eating and MNT to gain weight. Small frequent meals, boost, protein at all meals, added fat to meals.      Intervention Plan   Intervention Prescribe, educate and counsel regarding individualized specific dietary modifications aiming towards targeted core components such as weight, hypertension, lipid management, diabetes, heart failure and other comorbidities.;Nutrition handout(s) given to patient.    Expected Outcomes Short Term Goal: Understand basic principles of dietary content, such as calories, fat, sodium, cholesterol and nutrients.;Short Term Goal: A plan has been developed with personal nutrition goals set during dietitian appointment.;Long Term Goal: Adherence to prescribed nutrition plan.           Nutrition Assessments:  Nutrition Assessments - 08/02/20 1609      MEDFICTS Scores   Pre Score 66          MEDIFICTS Score Key:  ?70 Need to make dietary changes   40-70 Heart Healthy Diet  ? 40 Therapeutic Level Cholesterol Diet   Picture Your Plate Scores:  <69 Unhealthy dietary pattern with much room for improvement.  41-50 Dietary pattern unlikely to meet recommendations for good health and room for improvement.  51-60 More healthful dietary pattern, with some room for improvement.   >60 Healthy dietary pattern, although there may be some specific  behaviors that could be improved.   Nutrition Goals Re-Evaluation:  Nutrition Goals Re-Evaluation    [place] Name 10/04/20 1026 10/30/20 0951           Goals   Nutrition Goal ST: add boost 2x/day, add fat and protein to meals (mashed potatoes with butter and cream with beans for a snack,  nuts or nut butter and milk to oatmeal for example) LT: gain weight - 1st goal 100 lbs. ST: add boost 2x/day, add fat and protein to meals (mashed potatoes with butter and cream with beans for a snack, nuts or nut butter and milk to oatmeal for example) LT: gain weight - 1st goal 100 lbs.      Comment Rogelio has not been gaining any weight. She is taking the magesterol, but her appetite is lacking this week. She is using the boost daily now.  The ensure upsets her stomach, but boost is doing well.  She is trying to add in the fat and protein, but still hard to cook for one.  We talked about freezing some to help out. Jossette has lost more weight, sent note to her doctor suggesting referral to RD who can follow he rmore closely and prescribe supplemental nutrition to help with oral intake. Whittley reports her appetite is not good even with the megace - she reports having a doctors appointment and will speak to MD regarding medication and weight. Reiterated high kcal and high protein MNT.      Expected Outcome Short: Try cooking and freezing meals  and continue with boost Long: Continue work on weight gain ST: add boost 2x/day, add fat and protein to meals (mashed potatoes with butter and cream with beans for a snack, nuts or nut butter and milk to oatmeal for example) LT: gain weight - 1st goal 100 lbs.             Nutrition Goals Discharge (Final Nutrition Goals Re-Evaluation):  Nutrition Goals Re-Evaluation - 10/30/20 0951      Goals   Nutrition Goal ST: add boost 2x/day, add fat and protein to meals (mashed potatoes with butter and cream with beans for a snack, nuts or nut butter and milk to oatmeal for example) LT: gain weight - 1st goal 100 lbs.    Comment Kanon has lost more weight, sent note to her doctor suggesting referral to RD who can follow he rmore closely and prescribe supplemental nutrition to help with oral intake. Bernarda reports her appetite is not good even with the megace - she  reports having a doctors appointment and will speak to MD regarding medication and weight. Reiterated high kcal and high protein MNT.    Expected Outcome ST: add boost 2x/day, add fat and protein to meals (mashed potatoes with butter and cream with beans for a snack, nuts or nut butter and milk to oatmeal for example) LT: gain weight - 1st goal 100 lbs.           Psychosocial: Target Goals: Acknowledge presence or absence of significant depression and/or stress, maximize coping skills, provide positive support system. Participant is able to verbalize types and ability to use techniques and skills needed for reducing stress and depression.   Education: Stress, Anxiety, and Depression - Group verbal and visual presentation to define topics covered.  Reviews how body is impacted by stress, anxiety, and depression.  Also discusses healthy ways to reduce stress and to treat/manage anxiety and depression.  Written material given at graduation. Flowsheet Row Pulmonary Rehab  from 10/25/2020 in [facility] Cardiac and Pulmonary Rehab  Date 10/04/20  Educator SB  Instruction Review Code 1- [employer] Understanding      Education: Sleep Hygiene -Provides group verbal and written instruction about how sleep can affect your health.  Define sleep hygiene, discuss sleep cycles and impact of sleep habits. Review good sleep hygiene tips.    Initial Review & Psychosocial Screening:  Initial Psych Review & Screening - 07/31/20 0943      Initial Review   Current issues with Current Psychotropic Meds;Current Stress Concerns    Comments Her back bothers her and is wanting to do more exercise to help with mood.      Family Dynamics   Good Support System? Yes    Comments She can look to her cousin and friend for support.      Barriers   Psychosocial barriers to participate in program The patient should benefit from training in stress management and relaxation.      Screening Interventions   Interventions  Encouraged to exercise;To provide support and resources with identified psychosocial needs;Provide feedback about the scores to participant    Expected Outcomes Short Term goal: Utilizing psychosocial counselor, staff and physician to assist with identification of specific Stressors or current issues interfering with healing process. Setting desired goal for each stressor or current issue identified.;Long Term Goal: Stressors or current issues are controlled or eliminated.;Short Term goal: Identification and review with participant of any Quality of Life or Depression concerns found by scoring the questionnaire.;Long Term goal: The participant improves quality of Life and PHQ9 Scores as seen by post scores and/or verbalization of changes           Quality of Life Scores:  Scores of 19 and below usually indicate a poorer quality of life in these areas.  A difference of  2-3 points is a clinically meaningful difference.  A difference of 2-3 points in the total score of the Quality of Life Index has been associated with significant improvement in overall quality of life, self-image, physical symptoms, and general health in studies assessing change in quality of life.  PHQ-9: Recent Review Flowsheet Data    Depression screen [facility] 2/9 08/02/2020   Decreased Interest 0   Down, Depressed, Hopeless 0   PHQ - 2 Score 0   Altered sleeping 0   Tired, decreased energy 1   Change in appetite 1   Feeling bad or failure about yourself  0   Trouble concentrating 0   Moving slowly or fidgety/restless 0   Suicidal thoughts 0   PHQ-9 Score 2   Difficult doing work/chores Not difficult at all     Interpretation of Total Score  Total Score Depression Severity:  1-4 = Minimal depression, 5-9 = Mild depression, 10-14 = Moderate depression, 15-19 = Moderately severe depression, 20-27 = Severe depression   Psychosocial Evaluation and Intervention:  Psychosocial Evaluation - 07/31/20 0945      Psychosocial  Evaluation & Interventions   Interventions Encouraged to exercise with the program and follow exercise prescription;Relaxation education;Stress management education    Comments She can look to her cousin and friend for support. She states that with everything that is going on with COVID is stressful.    Expected Outcomes Short: Exercise regularly to support mental health and notify staff of any changes. Long: maintain mental health and well being through teaching of rehab or prescribed medications independently.    Continue Psychosocial Services  Follow up required by staff  Psychosocial Re-Evaluation:  Psychosocial Re-Evaluation    [place] Name 09/11/20 1004 10/04/20 1024 10/30/20 0958         Psychosocial Re-Evaluation   Current issues with Current Stress Concerns;Current Psychotropic Meds Current Stress Concerns;Current Psychotropic Meds Current Psychotropic Meds     Comments She has recently had a death in her family and is going to the funeral this Thursday. She has some stress regarding the current state of the world. She is on an anti-anxiety medication after a bad divorce and reports doing well on it. She rpeorts having a good support system - 41 year old friend who she speaks to daily, cousin was a good support system - passed recently - his children are supporting her, church supports her as well. She is on a sleep medication which has been helping - she was never a good sleeper - she sleeps at 11pm and wakes up to her dog at 7am. She reads to help manage stress and croquets. Elenor is doing well in rehab. She tries not to let things bother her as much, she does not have as much family left.  She has a couple of cousins left that support her and her chuch family.  She continues to sleep good.  She continues to do well on her meds. She reports no current stress concerns, she is still taking her medication as directed and it is still helping. She reports her sleep has been getting  better with new medicaiton - she is getting at least 6-7 hours of sleep until her dog wakes her up.     Expected Outcomes ST: Use her support system during this time of grieving LT: continue to use realxing activities and take medication as directed. short: Continue to lean on family as needed  Long: Continue to make time to rest and recover. short: Continue to lean on family as needed  Long: Continue to make time to rest and recover.     Interventions Encouraged to attend Pulmonary Rehabilitation for the exercise Encouraged to attend Pulmonary Rehabilitation for the exercise Encouraged to attend Pulmonary Rehabilitation for the exercise     Continue Psychosocial Services  Follow up required by staff -- Follow up required by staff     Comments She has recently had a death in her family and is going to the funeral this Thursday. She has some stress regarding the current state of the world. She is on an anti-anxiety medication after a bad divorce and reports doing well on it. She rpeorts having a good support system - 31 year old friend who she speaks to daily, cousin was a good support system - passed recently - his children are supporting her, church supports her as well. She is on a sleep medication which has been helping - she was never a good sleeper - she sleeps at 11pm and wakes up to her dog at 7am. She reads to help manage stress and croquets. -- --           Initial Review   Source of Stress Concerns Family -- --            Psychosocial Discharge (Final Psychosocial Re-Evaluation):  Psychosocial Re-Evaluation - 10/30/20 0958      Psychosocial Re-Evaluation   Current issues with Current Psychotropic Meds    Comments She reports no current stress concerns, she is still taking her medication as directed and it is still helping. She reports her sleep has been getting better with new medicaiton - she  is getting at least 6-7 hours of sleep until her dog wakes her up.    Expected Outcomes short:  Continue to lean on family as needed  Long: Continue to make time to rest and recover.    Interventions Encouraged to attend Pulmonary Rehabilitation for the exercise    Continue Psychosocial Services  Follow up required by staff           Education: Education Goals: Education classes will be provided on a weekly basis, covering required topics. Participant will state understanding/return demonstration of topics presented.  Learning Barriers/Preferences:  Learning Barriers/Preferences - 07/31/20 0941      Learning Barriers/Preferences   Learning Barriers Hearing    Learning Preferences None           General Pulmonary Education Topics:  Infection Prevention: - Provides verbal and written material to individual with discussion of infection control including proper hand washing and proper equipment cleaning during exercise session. Flowsheet Row Pulmonary Rehab from 10/25/2020 in [facility] Cardiac and Pulmonary Rehab  Date 07/31/20  Educator [facility]  Instruction Review Code 1- Verbalizes Understanding      Falls Prevention: - Provides verbal and written material to individual with discussion of falls prevention and safety. Flowsheet Row Pulmonary Rehab from 10/25/2020 in [facility] Cardiac and Pulmonary Rehab  Date 07/31/20  Educator [facility]  Instruction Review Code 1- Verbalizes Understanding      Chronic Lung Disease Review: - Group verbal instruction with posters, models, PowerPoint presentations and videos,  to review new updates, new respiratory medications, new advancements in procedures and treatments. Providing information on websites and "800" numbers for continued self-education. Includes information about supplement oxygen, available portable oxygen systems, continuous and intermittent flow rates, oxygen safety, concentrators, and Medicare reimbursement for oxygen. Explanation of Pulmonary Drugs, including class, frequency, complications, importance of spacers, rinsing mouth after  steroid MDI's, and proper cleaning methods for nebulizers. Review of basic lung anatomy and physiology related to function, structure, and complications of lung disease. Review of risk factors. Discussion about methods for diagnosing sleep apnea and types of masks and machines for OSA. Includes a review of the use of types of environmental controls: home humidity, furnaces, filters, dust mite/pet prevention, HEPA vacuums. Discussion about weather changes, air quality and the benefits of nasal washing. Instruction on Warning signs, infection symptoms, calling MD promptly, preventive modes, and value of vaccinations. Review of effective airway clearance, coughing and/or vibration techniques. Emphasizing that all should Create an Action Plan. Written material given at graduation. Flowsheet Row Pulmonary Rehab from 10/25/2020 in [facility] Cardiac and Pulmonary Rehab  Date 09/27/20  Educator jh  Instruction Review Code 1- Verbalizes Understanding      AED/CPR: - Group verbal and written instruction with the use of models to demonstrate the basic use of the AED with the basic ABC's of resuscitation.    Anatomy and Cardiac Procedures: - Group verbal and visual presentation and models provide information about basic cardiac anatomy and function. Reviews the testing methods done to diagnose heart disease and the outcomes of the test results. Describes the treatment choices: Medical Management, Angioplasty, or Coronary Bypass Surgery for treating various heart conditions including Myocardial Infarction, Angina, Valve Disease, and Cardiac Arrhythmias.  Written material given at graduation. Flowsheet Row Pulmonary Rehab from 10/25/2020 in [facility] Cardiac and Pulmonary Rehab  Date 08/23/20  Educator SB  Instruction Review Code 1- Verbalizes Understanding      Medication Safety: - Group verbal and visual instruction to review commonly prescribed medications for heart and  lung disease. Reviews the medication, class  of the drug, and side effects. Includes the steps to properly store meds and maintain the prescription regimen.  Written material given at graduation.   Other: -Provides group and verbal instruction on various topics (see comments)   Knowledge Questionnaire Score:  Knowledge Questionnaire Score - 08/02/20 1611      Knowledge Questionnaire Score   Pre Score 14/18: Oxygen, ADLs,            Core Components/Risk Factors/Patient Goals at Admission:  Personal Goals and Risk Factors at Admission - 08/02/20 1631      Core Components/Risk Factors/Patient Goals on Admission    Weight Management Yes;Weight Maintenance    Intervention Weight Management: Provide education and appropriate resources to help participant work on and attain dietary goals.;Weight Management/Obesity: Establish reasonable short term and long term weight goals.;Weight Management: Develop a combined nutrition and exercise program designed to reach desired caloric intake, while maintaining appropriate intake of nutrient and fiber, sodium and fats, and appropriate energy expenditure required for the weight goal.    Admit Weight 92 lb 11.2 oz (42 kg)    Goal Weight: Short Term 92 lb 11.2 oz (42 kg)    Goal Weight: Long Term 92 lb 11.2 oz (42 kg)    Expected Outcomes Short Term: Continue to assess and modify interventions until short term weight is achieved;Long Term: Adherence to nutrition and physical activity/exercise program aimed toward attainment of established weight goal;Weight Maintenance: Understanding of the daily nutrition guidelines, which includes 25-35% calories from fat, 7% or less cal from saturated fats, less than 200mg  cholesterol, less than 1.5gm of sodium, & 5 or more servings of fruits and vegetables daily;Understanding recommendations for meals to include 15-35% energy as protein, 25-35% energy from fat, 35-60% energy from carbohydrates, less than 200mg  of dietary cholesterol, 20-35 gm of total fiber  daily;Understanding of distribution of calorie intake throughout the day with the consumption of 4-5 meals/snacks    Improve shortness of breath with ADL's Yes    Intervention Provide education, individualized exercise plan and daily activity instruction to help decrease symptoms of SOB with activities of daily living.    Expected Outcomes Short Term: Improve cardiorespiratory fitness to achieve a reduction of symptoms when performing ADLs;Long Term: Be able to perform more ADLs without symptoms or delay the onset of symptoms    Hypertension Yes    Intervention Provide education on lifestyle modifcations including regular physical activity/exercise, weight management, moderate sodium restriction and increased consumption of fresh fruit, vegetables, and low fat dairy, alcohol moderation, and smoking cessation.;Monitor prescription use compliance.    Expected Outcomes Short Term: Continued assessment and intervention until BP is < 140/1mm HG in hypertensive participants. < 130/95mm HG in hypertensive participants with diabetes, heart failure or chronic kidney disease.;Long Term: Maintenance of blood pressure at goal levels.           Education:Diabetes - Individual verbal and written instruction to review signs/symptoms of diabetes, desired ranges of glucose level fasting, after meals and with exercise. Acknowledge that pre and post exercise glucose checks will be done for 3 sessions at entry of program.   Know Your Numbers and Heart Failure: - Group verbal and visual instruction to discuss disease risk factors for cardiac and pulmonary disease and treatment options.  Reviews associated critical values for Overweight/Obesity, Hypertension, Cholesterol, and Diabetes.  Discusses basics of heart failure: signs/symptoms and treatments.  Introduces Heart Failure Zone chart for action plan for heart failure.  Written material  given at graduation. Flowsheet Row Pulmonary Rehab from 10/25/2020 in [facility]  Cardiac and Pulmonary Rehab  Date 09/18/20  Educator [facility]  Instruction Review Code 1- Verbalizes Understanding      Core Components/Risk Factors/Patient Goals Review:   Goals and Risk Factor Review    Row Name 09/11/20 1011 10/04/20 1029 10/30/20 1006         Core Components/Risk Factors/Patient Goals Review   Personal Goals Review Improve shortness of breath with ADL's;Develop more efficient breathing techniques such as purse lipped breathing and diaphragmatic breathing and practicing self-pacing with activity. Improve shortness of breath with ADL's;Weight Management/Obesity Improve shortness of breath with ADL's;Weight Management/Obesity     Review She reports her shortness of breath has gotten better since her pnemonia - practices PLB and breathing exercises. She is working on self pacing during class - will talk about home exercise with EP. She is not on any BP medication - she does not have any hypertension at this time. Resting BP today 116/58. Sharla's weight is up a little.  She is trying to get back to 93 lb, but her eating is not where she wants it to be.  This week her appetite is not where she wants it to be. Her breathing is getting better overall and she is able to do more at home. Pressures continue to do well. Myrella's weight is down again at 84 pounds.  She is trying to get back to 93 lb, but her eating is not where she wants it to be, her appetite is worse. Reviewed high kcal and high protein nutrition with Barnett Applebaum and she will address it with her doctor this week. I suggested she see an RD who can follow her more closely and who can prescibe supplemental feedings to her oral intake.  Her breathing is getting better overall and she is able to do more at home.     Expected Outcomes ST: Continue to practice breathing techniques LT: talk to EP about home exercise Short: Continue to work on weight gain Long; Continue to monitor risk factors. Short: Continue to work on weight gain Long;  Continue to monitor risk factors.            Core Components/Risk Factors/Patient Goals at Discharge (Final Review):   Goals and Risk Factor Review - 10/30/20 1006      Core Components/Risk Factors/Patient Goals Review   Personal Goals Review Improve shortness of breath with ADL's;Weight Management/Obesity    Review Nimra's weight is down again at 84 pounds.  She is trying to get back to 93 lb, but her eating is not where she wants it to be, her appetite is worse. Reviewed high kcal and high protein nutrition with Barnett Applebaum and she will address it with her doctor this week. I suggested she see an RD who can follow her more closely and who can prescibe supplemental feedings to her oral intake.  Her breathing is getting better overall and she is able to do more at home.    Expected Outcomes Short: Continue to work on weight gain Long; Continue to monitor risk factors.           ITP Comments:  ITP Comments    Row Name 07/31/20 0950 08/02/20 1607 08/21/20 0954 08/29/20 0657 09/26/20 1021   ITP Comments Virtual Visit completed. Patient informed on EP and RD appointment and 6 Minute walk test. Patient also informed of patient health questionnaires on My Chart. Patient Verbalizes understanding. Visit diagnosis can be found in [facility]  07/26/2020. Completed 6MWT and gym orientation. Initial ITP created and sent for review to Dr. Emily Filbert, Medical Director. First full day of exercise!  Patient was oriented to gym and equipment including functions, settings, policies, and procedures.  Patient's individual exercise prescription and treatment plan were reviewed.  All starting workloads were established based on the results of the 6 minute walk test done at initial orientation visit.  The plan for exercise progression was also introduced and progression will be customized based on patient's performance and goals. 30 Day review completed. Medical Director ITP review done, changes made as directed, and signed  approval by Medical Director. 30 Day review completed. Medical Director ITP review done, changes made as directed, and signed approval by Medical Director.   [place] Name 10/08/20 1342 10/17/20 1248 10/24/20 0604 11/01/20 1029 11/21/20 0927   ITP Comments Barnett Applebaum called to let us know that she saw her doctor for her sciatica pain and he felt that when she tried the 3 lb weights, she may have pulled something and encouraged her to rest a week.  We also encouraged her to stretch and ice (if able) to help as well.  She will keep [place] posted on next week. Margrette has been out since last review. 30 Day review completed. Medical Director ITP review done, changes made as directed, and signed approval by Medical Director. Returned to program yesterday.  comcerned about her spinal stenosis and exercising without aggravating sciatica. Got her started back slowly with instruction to stop as needed. Makaiyah came in today after her MD appointment to let us know that she was advised to drop from the program to focus on weight loss. Tekila reports that the MD read my note with her and that he will be sending her to an RD. He is keeping her on Megace to help with her appetite. We will put her on a medical hold for now and see how her weight is improving to see when we can bring her back; we will reassess in a couple of months, told her to keep [place] updated. 30 Day review completed. Medical Director ITP review done, changes made as directed, and signed approval by Medical Director.   [place] Name 12/19/20 0649           ITP Comments 30 Day review completed. Medical Director ITP review done, changes made as directed, and signed approval by Medical Director.              Comments:

## 2020-12-27 ENCOUNTER — Ambulatory Visit
Admission: RE | Admit: 2020-12-27 | Discharge: 2020-12-27 | Disposition: A | Discharge status: Home / Self Care | Payer: Medicare PPO | Source: Ambulatory Visit | Attending: Family Medicine | Admitting: Family Medicine

## 2020-12-27 ENCOUNTER — Other Ambulatory Visit: Payer: Self-pay

## 2020-12-27 DIAGNOSIS — Z1231 Encounter for screening mammogram for malignant neoplasm of breast: Secondary | ICD-10-CM | POA: Diagnosis not present

## 2021-01-01 DIAGNOSIS — M329 Systemic lupus erythematosus, unspecified: Secondary | ICD-10-CM | POA: Diagnosis not present

## 2021-01-01 DIAGNOSIS — R0689 Other abnormalities of breathing: Secondary | ICD-10-CM | POA: Diagnosis not present

## 2021-01-01 DIAGNOSIS — R0600 Dyspnea, unspecified: Secondary | ICD-10-CM | POA: Diagnosis not present

## 2021-01-01 DIAGNOSIS — R918 Other nonspecific abnormal finding of lung field: Secondary | ICD-10-CM | POA: Diagnosis not present

## 2021-01-01 DIAGNOSIS — J984 Other disorders of lung: Secondary | ICD-10-CM | POA: Diagnosis not present

## 2021-01-01 DIAGNOSIS — Z981 Arthrodesis status: Secondary | ICD-10-CM | POA: Diagnosis not present

## 2021-01-04 DIAGNOSIS — E538 Deficiency of other specified B group vitamins: Secondary | ICD-10-CM | POA: Diagnosis not present

## 2021-01-07 ENCOUNTER — Inpatient Hospital Stay: Payer: Medicare PPO | Attending: Internal Medicine

## 2021-01-07 NOTE — Progress Notes (Signed)
Nutrition Assessment   Reason for Assessment:  Weight loss   ASSESSMENT:  80 year old female with iron deficiency anemia.  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    Met with patient in clinic.  Patient reports that after starting Pulmonary Rehab had pain from sciatica and lost weight, unable to eat much due to pain. Reports pain has improved since then.  Patient reports drinking boost unsure which one about 2 times per day.  Goes to [employer] 3 times per week and divides meals into 6.  Hard to cook for 1 person.  Reports yesterday got up at 7am and ate 2 english muffins with orange marmalade at 10am with coffee.  Lunch was 1/2 [place] [employer] sub (roast beef, mayo, and vegetables) with potato chips.  Ate other 1/2 for dinner.  Started predinisone last week prescribed by Dr. Loni Muse and feels better, more energy and appetite.  Continues taking megace.    Reports diarrhea for about 2 weeks but has gotten better.  Thinks it maybe related to change in medication.  Reports that foods get stuck and pointed to mid chest region.  Says that it happens once in awhile, unpredictable.     Medications: megace, predinsone, Vit C, Vit D and B 12, MVI, calcium carbonate, remeron   Labs: reviewed   Anthropometrics:   Height: 4'11" Weight: 83 lb 1 oz today in clinic UBW: 93 lb per patient BMI: 16  Per chart 90 lb in July 2021  7% weight loss in the last 8 months   Estimated Energy Needs  Kcals: 1100-1300 Protein: 55-65 g Fluid: > 1.1 L   NUTRITION DIAGNOSIS: Inadequate oral intake related to poor appetite, GI issues as evidenced by BMI 16     INTERVENTION:  Encouraged patient to discuss diarrhea and feeling of food getting stuck in mid esophagus with PCP, sees on 4/5.   Encouraged boost plus (360 calorie) BID Discussed making a schedule with times to eat/nibble during the day (q 1-2 hours).    Discussed ways to add more calories and protein in diet.  Handout provided along with handout on snack choices.   Coupons for boost given. Contact information given to patient   MONITORING, EVALUATION, GOAL: weight trends, intake   Next Visit: Monday, May 9th f/u in clinic  Othella Slappey B. Zenia Resides, [place], [place] Registered Dietitian [phone] (mobile)

## 2021-01-08 DIAGNOSIS — M48062 Spinal stenosis, lumbar region with neurogenic claudication: Secondary | ICD-10-CM | POA: Diagnosis not present

## 2021-01-08 DIAGNOSIS — M5136 Other intervertebral disc degeneration, lumbar region: Secondary | ICD-10-CM | POA: Diagnosis not present

## 2021-01-08 DIAGNOSIS — M5416 Radiculopathy, lumbar region: Secondary | ICD-10-CM | POA: Diagnosis not present

## 2021-01-29 DIAGNOSIS — M19042 Primary osteoarthritis, left hand: Secondary | ICD-10-CM | POA: Diagnosis not present

## 2021-01-29 DIAGNOSIS — D509 Iron deficiency anemia, unspecified: Secondary | ICD-10-CM | POA: Diagnosis not present

## 2021-01-29 DIAGNOSIS — M19041 Primary osteoarthritis, right hand: Secondary | ICD-10-CM | POA: Diagnosis not present

## 2021-01-29 DIAGNOSIS — R5383 Other fatigue: Secondary | ICD-10-CM | POA: Diagnosis not present

## 2021-01-29 DIAGNOSIS — R634 Abnormal weight loss: Secondary | ICD-10-CM | POA: Diagnosis not present

## 2021-01-29 DIAGNOSIS — K529 Noninfective gastroenteritis and colitis, unspecified: Secondary | ICD-10-CM | POA: Diagnosis not present

## 2021-01-29 DIAGNOSIS — L932 Other local lupus erythematosus: Secondary | ICD-10-CM | POA: Diagnosis not present

## 2021-01-29 DIAGNOSIS — R5381 Other malaise: Secondary | ICD-10-CM | POA: Diagnosis not present

## 2021-01-29 DIAGNOSIS — M3500 Sicca syndrome, unspecified: Secondary | ICD-10-CM | POA: Diagnosis not present

## 2021-01-29 DIAGNOSIS — R768 Other specified abnormal immunological findings in serum: Secondary | ICD-10-CM | POA: Diagnosis not present

## 2021-02-15 DIAGNOSIS — E538 Deficiency of other specified B group vitamins: Secondary | ICD-10-CM | POA: Diagnosis not present

## 2021-03-04 ENCOUNTER — Inpatient Hospital Stay: Payer: Medicare PPO | Attending: Internal Medicine

## 2021-03-04 NOTE — Progress Notes (Signed)
Nutrition Follow-up:  Patient with iron deficiency anemia.  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  Followed by Dr B.   Met with patient in clinic for follow-up.  Patient reports that appetite is good, swallowing has improved and diarrhea has stopped.  Continues to take predinsone and megace.  Has been eating breakfast (bagel with cream cheese yesterday).  Lunch yesterday was [employer] chicken sandwich with lettuce, tomato and mayo) and french fries.  Ate another sandwich for dinner.  Has switched to boost plus and drinking 2 a day. Snacking on graham crackers and peanut butter and belvita crackers.    Takes dog for walk in the park on nice days.  Other wise will go to [facility] or grocery store and walk    Medications: reviewed  Labs: reviewed  Anthropometrics:   Weight today taken in clinic 88 lb 8 oz   83 lb 1 oz on 3/14 UBW of 93 lb per patient   NUTRITION DIAGNOSIS: Inadequate oral intake improved   INTERVENTION:  Discussed ways to add calories and protein to current eating pattern.   Encouraged patient to try lactaid milk as sometimes regular milk upsets stomach Continue boost plus BID (360 calorie/per bottle) Contact information provided    MONITORING, EVALUATION, GOAL: weight trends, intake   NEXT VISIT: July 18 (Monday) weight check  Echo [place] B. Zenia Resides, [place], [place] Registered Dietitian [phone] (mobile)

## 2021-03-22 DIAGNOSIS — E538 Deficiency of other specified B group vitamins: Secondary | ICD-10-CM | POA: Diagnosis not present

## 2021-04-05 ENCOUNTER — Telehealth: Payer: Self-pay

## 2021-04-05 NOTE — Telephone Encounter (Signed)
Nutrition  RD called and spoke with patient.  RD needs to change nutrition appointment from 7/18 to 7/19 at 10:30am.  Patient agreeable.   Renee Cabrera B. Zenia Resides, [place], [place] Registered Dietitian [phone] (mobile)

## 2021-04-10 DIAGNOSIS — M48062 Spinal stenosis, lumbar region with neurogenic claudication: Secondary | ICD-10-CM | POA: Diagnosis not present

## 2021-04-10 DIAGNOSIS — M5136 Other intervertebral disc degeneration, lumbar region: Secondary | ICD-10-CM | POA: Diagnosis not present

## 2021-04-10 DIAGNOSIS — M62830 Muscle spasm of back: Secondary | ICD-10-CM | POA: Diagnosis not present

## 2021-04-10 DIAGNOSIS — M5416 Radiculopathy, lumbar region: Secondary | ICD-10-CM | POA: Diagnosis not present

## 2021-04-10 DIAGNOSIS — M461 Sacroiliitis, not elsewhere classified: Secondary | ICD-10-CM | POA: Diagnosis not present

## 2021-04-15 DIAGNOSIS — M810 Age-related osteoporosis without current pathological fracture: Secondary | ICD-10-CM | POA: Diagnosis not present

## 2021-04-25 ENCOUNTER — Other Ambulatory Visit: Payer: Self-pay | Admitting: Dermatology

## 2021-04-25 DIAGNOSIS — L409 Psoriasis, unspecified: Secondary | ICD-10-CM

## 2021-04-26 DIAGNOSIS — E538 Deficiency of other specified B group vitamins: Secondary | ICD-10-CM | POA: Diagnosis not present

## 2021-05-14 ENCOUNTER — Other Ambulatory Visit: Payer: Self-pay

## 2021-05-14 ENCOUNTER — Inpatient Hospital Stay: Payer: Medicare PPO | Attending: Internal Medicine

## 2021-05-14 NOTE — Progress Notes (Signed)
Nutrition Follow-up:   Patient with iron deficiency anemia.  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  Followed by Dr B.  Met with patient in clinic for nutrition follow-up. Patient reports that she feels fatigued.  Appetite has been good.  Has not been going out to eat much due to finances.  Has been eating 3 meals per day.  Breakfast is oatmeal or english muffin and coffee.  Lunch is usually sandwich and dinner recently cooked chicken in crockpot with potatoes and carrots.  Sometimes goes to [employer] and gets meat and couple of sides to last a few days.  Has gotten [employer] barbecue, buns and slaw recently. Has been eating ice cream about 3 times per week before bed.  Drinking 1-2 boost plus shakes a day.       Medications: remeron, megace, predinsone   Anthropometrics:   Weight 90 lb 7 oz today  88 lb 8 oz on 5/9 83 lb 1 oz on 3/14  UBW of 93 lb per patient   NUTRITION DIAGNOSIS: Inadequate oral intake improved   INTERVENTION:  Continue boost plus BID.  If weight drops would recommend switching to Boost VHC shake (530 calories, 22 g protein).  Provided patient with written information on how to order shakes, if needed Continue high calorie, high protein foods to continue weight gain. Contact information provided and patient will contact RD if needed    MONITORING, EVALUATION, GOAL: weight trends, intake   NEXT VISIT: no follow-up Patient to contact RD with questions or concerns  Renee Cabrera B. Zenia Resides, [place], [place] Registered Dietitian [phone] (mobile)

## 2021-05-22 DIAGNOSIS — H353131 Nonexudative age-related macular degeneration, bilateral, early dry stage: Secondary | ICD-10-CM | POA: Diagnosis not present

## 2021-05-22 DIAGNOSIS — Z0100 Encounter for examination of eyes and vision without abnormal findings: Secondary | ICD-10-CM | POA: Diagnosis not present

## 2021-06-11 ENCOUNTER — Other Ambulatory Visit: Payer: Self-pay | Admitting: Pulmonary Disease

## 2021-06-11 DIAGNOSIS — R0600 Dyspnea, unspecified: Secondary | ICD-10-CM

## 2021-06-12 ENCOUNTER — Inpatient Hospital Stay: Payer: Medicare PPO | Attending: Internal Medicine

## 2021-06-12 ENCOUNTER — Other Ambulatory Visit: Payer: Self-pay

## 2021-06-12 DIAGNOSIS — E538 Deficiency of other specified B group vitamins: Secondary | ICD-10-CM | POA: Diagnosis not present

## 2021-06-12 DIAGNOSIS — Z9884 Bariatric surgery status: Secondary | ICD-10-CM | POA: Diagnosis not present

## 2021-06-12 DIAGNOSIS — D508 Other iron deficiency anemias: Secondary | ICD-10-CM | POA: Insufficient documentation

## 2021-06-12 DIAGNOSIS — K9589 Other complications of other bariatric procedure: Secondary | ICD-10-CM

## 2021-06-12 LAB — BASIC METABOLIC PANEL
Anion gap: 8 (ref 5–15)
BUN: 15 mg/dL (ref 8–23)
CO2: 21 mmol/L — ABNORMAL LOW (ref 22–32)
Calcium: 8.3 mg/dL — ABNORMAL LOW (ref 8.9–10.3)
Chloride: 111 mmol/L (ref 98–111)
Creatinine, Ser: 1.33 mg/dL — ABNORMAL HIGH (ref 0.44–1.00)
GFR, Estimated: 40 mL/min — ABNORMAL LOW (ref >60)
Glucose, Bld: 96 mg/dL (ref 70–99)
Potassium: 4.7 mmol/L (ref 3.5–5.1)
Sodium: 140 mmol/L (ref 135–145)

## 2021-06-12 LAB — CBC WITH DIFFERENTIAL/PLATELET
Abs Immature Granulocytes: 0.34 10*3/uL — ABNORMAL HIGH (ref 0.00–0.07)
Basophils Absolute: 0.1 10*3/uL (ref 0.0–0.1)
Basophils Relative: 1 %
Eosinophils Absolute: 0.1 10*3/uL (ref 0.0–0.5)
Eosinophils Relative: 1 %
HCT: 43.2 % (ref 36.0–46.0)
Hemoglobin: 14 g/dL (ref 12.0–15.0)
Immature Granulocytes: 3 %
Lymphocytes Relative: 12 %
Lymphs Abs: 1.2 10*3/uL (ref 0.7–4.0)
MCH: 33.9 pg (ref 26.0–34.0)
MCHC: 32.4 g/dL (ref 30.0–36.0)
MCV: 104.6 fL — ABNORMAL HIGH (ref 80.0–100.0)
Monocytes Absolute: 0.4 10*3/uL (ref 0.1–1.0)
Monocytes Relative: 4 %
Neutro Abs: 7.9 10*3/uL — ABNORMAL HIGH (ref 1.7–7.7)
Neutrophils Relative %: 79 %
Platelets: 424 10*3/uL — ABNORMAL HIGH (ref 150–400)
RBC: 4.13 MIL/uL (ref 3.87–5.11)
RDW: 13.3 % (ref 11.5–15.5)
WBC: 10 10*3/uL (ref 4.0–10.5)
nRBC: 0 % (ref 0.0–0.2)

## 2021-06-12 LAB — FERRITIN: Ferritin: 145 ng/mL (ref 11–307)

## 2021-06-12 LAB — C-REACTIVE PROTEIN: CRP: 0.7 mg/dL (ref <1.0)

## 2021-06-12 LAB — IRON AND TIBC
Iron: 69 ug/dL (ref 28–170)
Saturation Ratios: 25 % (ref 10.4–31.8)
TIBC: 276 ug/dL (ref 250–450)
UIBC: 207 ug/dL

## 2021-06-13 ENCOUNTER — Telehealth: Payer: Self-pay | Admitting: *Deleted

## 2021-06-13 NOTE — Telephone Encounter (Signed)
Spoke with patient ok with cnl her apts tomorrow. She understands that she does not need an IV iron infusion at this time. Pt agreeable to reschedule her apts out 3 months - lab md/ possible venofer (cbc, metb)  Colette- pt asking you to call her back with new apts. Thanks- [employer]

## 2021-06-13 NOTE — Telephone Encounter (Signed)
H/T-please inform patient that her iron levels look okay; and she might not need IV iron; and we can reschedule to 3 months from now-MD labs-CBC BMP possible Venofer. Other option for the patient would be-I could speak to her virtually if she wants.

## 2021-06-14 ENCOUNTER — Inpatient Hospital Stay: Payer: Medicare PPO | Admitting: Internal Medicine

## 2021-06-14 ENCOUNTER — Inpatient Hospital Stay: Payer: Medicare PPO

## 2021-06-25 ENCOUNTER — Ambulatory Visit: Payer: Medicare PPO

## 2021-06-25 ENCOUNTER — Ambulatory Visit
Admission: RE | Admit: 2021-06-25 | Discharge: 2021-06-25 | Disposition: A | Discharge status: Home / Self Care | Payer: Medicare PPO | Attending: Pulmonary Disease | Admitting: Pulmonary Disease

## 2021-06-27 ENCOUNTER — Ambulatory Visit: Admission: RE | Admit: 2021-06-27 | Payer: Medicare PPO | Source: Ambulatory Visit

## 2021-07-17 ENCOUNTER — Ambulatory Visit
Admission: RE | Admit: 2021-07-17 | Discharge: 2021-07-17 | Disposition: A | Discharge status: Home / Self Care | Payer: Medicare PPO | Source: Ambulatory Visit | Attending: Pulmonary Disease | Admitting: Pulmonary Disease

## 2021-07-17 ENCOUNTER — Other Ambulatory Visit: Payer: Self-pay

## 2021-07-17 DIAGNOSIS — R0600 Dyspnea, unspecified: Secondary | ICD-10-CM | POA: Insufficient documentation

## 2021-07-17 DIAGNOSIS — R0689 Other abnormalities of breathing: Secondary | ICD-10-CM | POA: Diagnosis present

## 2021-07-17 LAB — POCT I-STAT CREATININE: Creatinine, Ser: 1.3 mg/dL — ABNORMAL HIGH (ref 0.44–1.00)

## 2021-07-17 MED ORDER — IOHEXOL 350 MG/ML SOLN
50.0000 mL | Freq: Once | INTRAVENOUS | Status: AC | PRN
  Administered 2021-07-17: 50 mL via INTRAVENOUS

## 2021-09-11 ENCOUNTER — Other Ambulatory Visit: Payer: Self-pay

## 2021-09-11 ENCOUNTER — Inpatient Hospital Stay: Payer: Medicare PPO | Attending: Internal Medicine

## 2021-09-11 DIAGNOSIS — E538 Deficiency of other specified B group vitamins: Secondary | ICD-10-CM | POA: Insufficient documentation

## 2021-09-11 DIAGNOSIS — Z903 Acquired absence of stomach [part of]: Secondary | ICD-10-CM | POA: Insufficient documentation

## 2021-09-11 DIAGNOSIS — I129 Hypertensive chronic kidney disease with stage 1 through stage 4 chronic kidney disease, or unspecified chronic kidney disease: Secondary | ICD-10-CM | POA: Diagnosis not present

## 2021-09-11 DIAGNOSIS — D509 Iron deficiency anemia, unspecified: Secondary | ICD-10-CM | POA: Diagnosis present

## 2021-09-11 DIAGNOSIS — K9589 Other complications of other bariatric procedure: Secondary | ICD-10-CM

## 2021-09-11 DIAGNOSIS — N183 Chronic kidney disease, stage 3 unspecified: Secondary | ICD-10-CM | POA: Insufficient documentation

## 2021-09-11 LAB — IRON AND TIBC
Iron: 84 ug/dL (ref 28–170)
Saturation Ratios: 28 % (ref 10.4–31.8)
TIBC: 301 ug/dL (ref 250–450)
UIBC: 217 ug/dL

## 2021-09-11 LAB — COMPREHENSIVE METABOLIC PANEL
ALT: 11 U/L (ref 0–44)
AST: 18 U/L (ref 15–41)
Albumin: 3.9 g/dL (ref 3.5–5.0)
Alkaline Phosphatase: 52 U/L (ref 38–126)
Anion gap: 11 (ref 5–15)
BUN: 16 mg/dL (ref 8–23)
CO2: 19 mmol/L — ABNORMAL LOW (ref 22–32)
Calcium: 8.5 mg/dL — ABNORMAL LOW (ref 8.9–10.3)
Chloride: 108 mmol/L (ref 98–111)
Creatinine, Ser: 1.41 mg/dL — ABNORMAL HIGH (ref 0.44–1.00)
GFR, Estimated: 37 mL/min — ABNORMAL LOW (ref >60)
Glucose, Bld: 84 mg/dL (ref 70–99)
Potassium: 4.6 mmol/L (ref 3.5–5.1)
Sodium: 138 mmol/L (ref 135–145)
Total Bilirubin: 0.1 mg/dL — ABNORMAL LOW (ref 0.3–1.2)
Total Protein: 7.2 g/dL (ref 6.5–8.1)

## 2021-09-11 LAB — CBC WITH DIFFERENTIAL/PLATELET
Abs Immature Granulocytes: 0.25 10*3/uL — ABNORMAL HIGH (ref 0.00–0.07)
Basophils Absolute: 0.1 10*3/uL (ref 0.0–0.1)
Basophils Relative: 1 %
Eosinophils Absolute: 0.2 10*3/uL (ref 0.0–0.5)
Eosinophils Relative: 2 %
HCT: 48.5 % — ABNORMAL HIGH (ref 36.0–46.0)
Hemoglobin: 15 g/dL (ref 12.0–15.0)
Immature Granulocytes: 2 %
Lymphocytes Relative: 20 %
Lymphs Abs: 2.1 10*3/uL (ref 0.7–4.0)
MCH: 31.1 pg (ref 26.0–34.0)
MCHC: 30.9 g/dL (ref 30.0–36.0)
MCV: 100.4 fL — ABNORMAL HIGH (ref 80.0–100.0)
Monocytes Absolute: 0.8 10*3/uL (ref 0.1–1.0)
Monocytes Relative: 8 %
Neutro Abs: 6.8 10*3/uL (ref 1.7–7.7)
Neutrophils Relative %: 67 %
Platelets: 517 10*3/uL — ABNORMAL HIGH (ref 150–400)
RBC: 4.83 MIL/uL (ref 3.87–5.11)
RDW: 13.1 % (ref 11.5–15.5)
WBC: 10.3 10*3/uL (ref 4.0–10.5)
nRBC: 0 % (ref 0.0–0.2)

## 2021-09-11 LAB — C-REACTIVE PROTEIN: CRP: 0.7 mg/dL (ref <1.0)

## 2021-09-11 LAB — FERRITIN: Ferritin: 176 ng/mL (ref 11–307)

## 2021-09-13 ENCOUNTER — Encounter: Payer: Self-pay | Admitting: Internal Medicine

## 2021-09-13 ENCOUNTER — Inpatient Hospital Stay: Payer: Medicare PPO

## 2021-09-13 ENCOUNTER — Inpatient Hospital Stay: Payer: Medicare PPO | Admitting: Internal Medicine

## 2021-09-13 ENCOUNTER — Other Ambulatory Visit: Payer: Self-pay

## 2021-09-13 DIAGNOSIS — D508 Other iron deficiency anemias: Secondary | ICD-10-CM | POA: Diagnosis not present

## 2021-09-13 DIAGNOSIS — K9589 Other complications of other bariatric procedure: Secondary | ICD-10-CM | POA: Diagnosis not present

## 2021-09-13 DIAGNOSIS — D509 Iron deficiency anemia, unspecified: Secondary | ICD-10-CM | POA: Diagnosis not present

## 2021-09-13 NOTE — Assessment & Plan Note (Addendum)
#   Iron deficient anemia-s/p partial gastrectomy-hemoglobin stable-13.8 saturation 27%; hold off IV iron.  # CKD- III-reviewed renal function detail.  [avoid nephrotoxins]- recommend Fluids.   # B12 deficiency secondary to surgery- continue parental B12 with PCP--STABLE.   # DISPOSITION: # HOLD venofer today # follow up in 6 month- MD-possible Venofer-; labs- 1-2 days prior- cbc/BMP/crp/iron studies/ferritin/ Dr.B  Cc: Dr.Hedrick

## 2021-09-13 NOTE — Progress Notes (Signed)
[name] OFFICE PROGRESS NOTE  Patient Care Team: [name], MD as PCP - General (Family Medicine) Cammie Sickle, MD as Consulting Physician (Hematology and Oncology)   Cancer Staging  No matching staging information was found for the patient.   Oncology History   No history exists.  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 Patient also intolerant of oral iron  -   received parenteral iron therapy with Feraheme 510 mg x 2 doses in May 2014.   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  female pleasant patient above history of Iron deficiency anemia status post partial gastrectomy is here for follow-up.  Patient denies any further weight loss.  Denies any nausea vomiting abdominal pain.  Noted to have neck pain had x-rays done earlier in the week.  Review of Systems  Constitutional:  Positive for malaise/fatigue and weight loss. Negative for chills, diaphoresis and fever.  HENT:  Negative for nosebleeds and sore throat.   Eyes:  Negative for double vision.  Respiratory:  Positive for cough, hemoptysis, sputum production and shortness of breath. Negative for wheezing.   Cardiovascular:  Negative for chest pain, palpitations, orthopnea and leg swelling.  Gastrointestinal:  Negative for abdominal pain, blood in stool, constipation, diarrhea, heartburn, melena, nausea and vomiting.       Sometimes- dysphagia  Genitourinary:  Negative for dysuria, frequency and urgency.  Musculoskeletal:  Negative for back pain and joint pain.  Skin: Negative.  Negative for itching and rash.  Neurological:  Negative for dizziness, tingling, focal weakness, weakness and headaches.  Endo/Heme/Allergies:  Does not bruise/bleed easily.  Psychiatric/Behavioral:   Negative for depression. The patient is not nervous/anxious and does not have insomnia.   PAST MEDICAL HISTORY :  Past Medical History:  Diagnosis Date   Anemia    IRON INFUSIONS   Arthritis    osteoarthritis/ RA   Asthma    remote h/o asthma   Depression    controlled   H/O hypoglycemia    H/O: GI bleed    Headache(784.0)    h/o migraines   Hearing loss, bilateral    wears hearing aides   History of blood transfusion    pt states she had yellow jaundice   Hypertension    H/O NO TX NOW   Lupus (systemic lupus erythematosus) ([place]) 1995   in remission since 2005   Nasal sinus congestion    Osteoporosis with fracture    Stress fracture of foot    left foot   Vitamin B 12 deficiency     PAST SURGICAL HISTORY :   Past Surgical History:  Procedure Laterality Date   ANTERIOR CERVICAL DECOMP/DISCECTOMY FUSION  2009   BACK SURGERY     CATARACT EXTRACTION W/PHACO Left 09/30/2016   Procedure: CATARACT EXTRACTION PHACO AND INTRAOCULAR LENS PLACEMENT ([place]);  Surgeon: Birder Robson, MD;  Location: [facility] ORS;  Service: Ophthalmology;  Laterality: Left;  Lot# [id number] H [place]: 00:43.6 AP%: 19.6 CDE: 8.55   CATARACT EXTRACTION W/PHACO Right 01/11/2019   Procedure: CATARACT EXTRACTION PHACO AND INTRAOCULAR LENS PLACEMENT ([place]) RIGHT;  Surgeon: Birder Robson, MD;  Location: [facility];  Service: Ophthalmology;  Laterality: Right;  [place] 01:03.9 CDE 9.19 Fluid pack Lot # [id number]   INTRAMEDULLARY (IM) NAIL INTERTROCHANTERIC Right 07/01/2016   Procedure: INTRAMEDULLARY (IM) NAIL EXCHANGE OF  FEMORAL ROD;  Surgeon: Hessie Knows, MD;  Location: [facility] OR[facility];  Service: Orthopedics;  Laterality: Right;   ORIF FEMUR FRACTURE  2012   PARTIAL GASTRECTOMY  1963   and reconstruction 1973   TONSILLECTOMY  1945    FAMILY HISTORY :   Family History  Problem Relation Age of Onset   Breast cancer Neg Hx     SOCIAL HISTORY:   Social History   Tobacco Use   Smoking status: Never   Smokeless tobacco:  Never  Substance Use Topics   Alcohol use: No   Drug use: No    ALLERGIES:  is allergic to sulfa antibiotics.  MEDICATIONS:  Current Outpatient Medications  Medication Sig Dispense Refill   ascorbic Acid (VITAMIN C) 500 MG CPCR Take by mouth.     aspirin EC 81 MG tablet Take 81 mg by mouth daily.     BELSOMRA 10 MG TABS Take 10 mg by mouth at bedtime.   5   Calcium Carbonate-Vitamin D 600-400 MG-UNIT tablet Take by mouth.     cholecalciferol (VITAMIN D) 1000 UNITS tablet Take 1,000 Units by mouth every evening.      clobetasol (TEMOVATE) 0.05 % external solution MIX IN A TUB OF CERAVE CREAM AND USE EVERYDAY UP TO TWICE DAILY ON ITCHY RASH UNTIL CLEAR, AVOID [place], [place], [place]. 50 mL 0   cyanocobalamin (,VITAMIN B-12,) 1000 MCG/ML injection Inject 1,000 mcg into the muscle every 30 (thirty) days.      denosumab (PROLIA) 60 MG/ML SOSY injection Inject 60 mg into the skin every 6 (six) months.      Fe Fum-FA-B Cmp-C-Zn-Mg-Mn-Cu (HEMATINIC PLUS VIT/MINERALS) 106-1 MG TABS Take 1 tablet by mouth daily with lunch.   3   gabapentin (NEURONTIN) 100 MG capsule Take 100-200 mg by mouth See admin instructions. Take 1 capsule (100 mg) by mouth in the morning & take 2 capsules (200 mg) by mouth at night.     HYDROcodone-acetaminophen (NORCO/VICODIN) 5-325 MG tablet Take 1 tablet by mouth every 4 (four) hours as needed (pain.).     ketoconazole (NIZORAL) 2 % shampoo APPLY 1 APPLICATION TOPICALLY DAILY. MASSAGE INTO SCALP AND LEAVE IN FOR 10 MINUTES BEFORE RINSING OUT 120 mL 4   megestrol (MEGACE) 40 MG/ML suspension Take 800 mg by mouth daily.     meloxicam (MOBIC) 7.5 MG tablet Take 1 tablet by mouth daily.     mirtazapine (REMERON) 30 MG tablet Take 30 mg by mouth at bedtime.     Multiple Vitamins-Minerals (PRESERVISION AREDS) TABS Take 1 tablet by mouth 2 (two) times daily.      pantoprazole (PROTONIX) 40 MG tablet Take 40 mg by mouth daily before supper.   1   Suvorexant (BELSOMRA) 10 MG TABS  Take 1 tablet by mouth daily.     Travoprost, BAK Free, (TRAVATAN) 0.004 % SOLN ophthalmic solution SMARTSIG:1 Drop(s) In Eye(s) Every Evening     travoprost, benzalkonium, (TRAVATAN) 0.004 % ophthalmic solution Place 1 drop into both eyes at bedtime.     venlafaxine XR (EFFEXOR-XR) 37.5 MG 24 hr capsule Take by mouth.     vitamin C (ASCORBIC ACID) 500 MG tablet Take 500 mg by mouth 2 (two) times daily.     Calcium Carb-Cholecalciferol (CALTRATE 600+D3 PO) Take 1 tablet by mouth 2 (two) times daily. (Patient not taking: Reported on 09/13/2021)     citalopram (CELEXA) 20 MG tablet Take 20 mg by mouth daily at 8 pm. (1900) (Patient not taking: Reported on 09/13/2021)  citalopram (CELEXA) 20 MG tablet Take 1 tablet by mouth daily. (Patient not taking: Reported on 09/13/2021)     Fe Fum-FA-B Cmp-C-Zn-Mg-Mn-Cu (HEMATINIC PLUS VIT/MINERALS) 106-1 MG TABS Take 1 tablet by mouth daily. (Patient not taking: Reported on 09/13/2021)     megestrol (MEGACE) 40 MG/ML suspension Take by mouth. (Patient not taking: Reported on 09/13/2021)     meloxicam (MOBIC) 7.5 MG tablet Take 7.5 mg by mouth daily with supper.  (Patient not taking: Reported on 09/13/2021)     mirtazapine (REMERON) 45 MG tablet Take 1 tablet by mouth at bedtime. (Patient not taking: Reported on 09/13/2021)     mirtazapine (REMERON) 45 MG tablet  (Patient not taking: Reported on 09/13/2021)     pantoprazole (PROTONIX) 40 MG tablet Take 1 tablet by mouth daily. (Patient not taking: Reported on 09/13/2021)     No current facility-administered medications for this visit.    PHYSICAL EXAMINATION: ECOG PERFORMANCE STATUS: 0 - Asymptomatic  BP (!) 158/91   Pulse (!) 101   Temp 98.3 F (36.8 C)   Resp 16   Wt 88 lb 12.8 oz (40.3 kg)   BMI 17.94 kg/m   Filed Weights   09/13/21 1347  Weight: 88 lb 12.8 oz (40.3 kg)    Physical Exam Constitutional:      Comments: Thin built Caucasian female patient.  HENT:     Head: Normocephalic  and atraumatic.     Mouth/Throat:     Pharynx: No oropharyngeal exudate.  Eyes:     Pupils: Pupils are equal, round, and reactive to light.  Cardiovascular:     Rate and Rhythm: Normal rate and regular rhythm.  Pulmonary:     Effort: Pulmonary effort is normal. No respiratory distress.     Breath sounds: Normal breath sounds. No wheezing.  Abdominal:     General: Bowel sounds are normal. There is no distension.     Palpations: Abdomen is soft. There is no mass.     Tenderness: There is no abdominal tenderness. There is no guarding or rebound.  Musculoskeletal:        General: No tenderness. Normal range of motion.     Cervical back: Normal range of motion and neck supple.  Skin:    General: Skin is warm.  Neurological:     Mental Status: She is alert and oriented to person, place, and time.  Psychiatric:        Mood and Affect: Affect normal.     LABORATORY DATA:  I have reviewed the data as listed    Component Value Date/Time   NA 138 09/11/2021 1043   NA 142 02/09/2013 0515   K 4.6 09/11/2021 1043   K 4.0 02/09/2013 0515   CL 108 09/11/2021 1043   CL 113 (H) 02/09/2013 0515   CO2 19 (L) 09/11/2021 1043   CO2 25 02/09/2013 0515   GLUCOSE 84 09/11/2021 1043   GLUCOSE 80 02/09/2013 0515   BUN 16 09/11/2021 1043   BUN 20 (H) 02/09/2013 0515   CREATININE 1.41 (H) 09/11/2021 1043   CREATININE 0.58 (L) 02/09/2013 0515   CALCIUM 8.5 (L) 09/11/2021 1043   CALCIUM 7.5 (L) 02/09/2013 0515   PROT 7.2 09/11/2021 1043   PROT 6.8 02/08/2013 1558   ALBUMIN 3.9 09/11/2021 1043   ALBUMIN 3.8 02/08/2013 1558   AST 18 09/11/2021 1043   AST 16 02/08/2013 1558   ALT 11 09/11/2021 1043   ALT 12 02/08/2013 1558   ALKPHOS 52 09/11/2021 1043  ALKPHOS 53 02/08/2013 1558   BILITOT 0.1 (L) 09/11/2021 1043   BILITOT 0.2 02/08/2013 1558   GFRNONAA 37 (L) 09/11/2021 1043   GFRNONAA >60 02/09/2013 0515   GFRAA >60 10/06/2019 0902   GFRAA >60 02/09/2013 0515    No results found for:  SPEP, UPEP  Lab Results  Component Value Date   WBC 10.3 09/11/2021   NEUTROABS 6.8 09/11/2021   HGB 15.0 09/11/2021   HCT 48.5 (H) 09/11/2021   MCV 100.4 (H) 09/11/2021   PLT 517 (H) 09/11/2021      Chemistry      Component Value Date/Time   NA 138 09/11/2021 1043   NA 142 02/09/2013 0515   K 4.6 09/11/2021 1043   K 4.0 02/09/2013 0515   CL 108 09/11/2021 1043   CL 113 (H) 02/09/2013 0515   CO2 19 (L) 09/11/2021 1043   CO2 25 02/09/2013 0515   BUN 16 09/11/2021 1043   BUN 20 (H) 02/09/2013 0515   CREATININE 1.41 (H) 09/11/2021 1043   CREATININE 0.58 (L) 02/09/2013 0515      Component Value Date/Time   CALCIUM 8.5 (L) 09/11/2021 1043   CALCIUM 7.5 (L) 02/09/2013 0515   ALKPHOS 52 09/11/2021 1043   ALKPHOS 53 02/08/2013 1558   AST 18 09/11/2021 1043   AST 16 02/08/2013 1558   ALT 11 09/11/2021 1043   ALT 12 02/08/2013 1558   BILITOT 0.1 (L) 09/11/2021 1043   BILITOT 0.2 02/08/2013 1558       RADIOGRAPHIC STUDIES: I have personally reviewed the radiological images as listed and agreed with the findings in the report. No results found.   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  # CKD- III-reviewed renal function detail.  [avoid nephrotoxins]- recommend Fluids.   # B12 deficiency secondary to surgery- continue parental B12 with PCP--STABLE.   # DISPOSITION: # HOLD venofer today # follow up in 6 month- MD-possible Venofer-; labs- 1-2 days prior- cbc/BMP/crp/iron studies/ferritin/ Dr.B  Cc: Dr.Hedrick   Orders Placed This Encounter  Procedures   CBC with Differential/Platelet    Standing Status:   Future    Standing Expiration Date:   29/92/4268   Basic metabolic panel    Standing Status:   Future    Standing Expiration Date:   09/13/2022   Iron and TIBC    Standing Status:   Future    Standing Expiration Date:    09/13/2022   Ferritin    Standing Status:   Future    Standing Expiration Date:   09/13/2022   C-reactive protein    Standing Status:   Future    Standing Expiration Date:   09/13/2022   All questions were answered. The patient knows to call the clinic with any problems, questions or concerns.      Cammie Sickle, MD 09/13/2021 2:54 PM

## 2021-09-13 NOTE — Progress Notes (Signed)
Patient recently been evaluated by PCP for neck pain and had x-ray at [facility] 2 days ago.

## 2021-10-14 ENCOUNTER — Other Ambulatory Visit: Payer: Self-pay | Admitting: Family Medicine

## 2021-10-14 DIAGNOSIS — M5412 Radiculopathy, cervical region: Secondary | ICD-10-CM

## 2021-10-26 ENCOUNTER — Other Ambulatory Visit: Payer: Self-pay

## 2021-10-26 ENCOUNTER — Ambulatory Visit
Admission: RE | Admit: 2021-10-26 | Discharge: 2021-10-26 | Disposition: A | Discharge status: Home / Self Care | Payer: Medicare PPO | Source: Ambulatory Visit | Attending: Family Medicine | Admitting: Family Medicine

## 2021-10-26 DIAGNOSIS — M5412 Radiculopathy, cervical region: Secondary | ICD-10-CM | POA: Insufficient documentation

## 2021-10-27 HISTORY — PX: RECTAL POLYPECTOMY: SHX2309

## 2021-11-14 IMAGING — CR DG CHEST 2V
1 series · 2 of 2 positions shown · non-contrast
Comparison: 11/24/2012

CLINICAL DATA: Hemoptysis

EXAM:
CHEST - 2 VIEW

[Series 1: dg chest 2 view · 0.14mm/px · 2 of 2 slices shown]
[im 1/2]
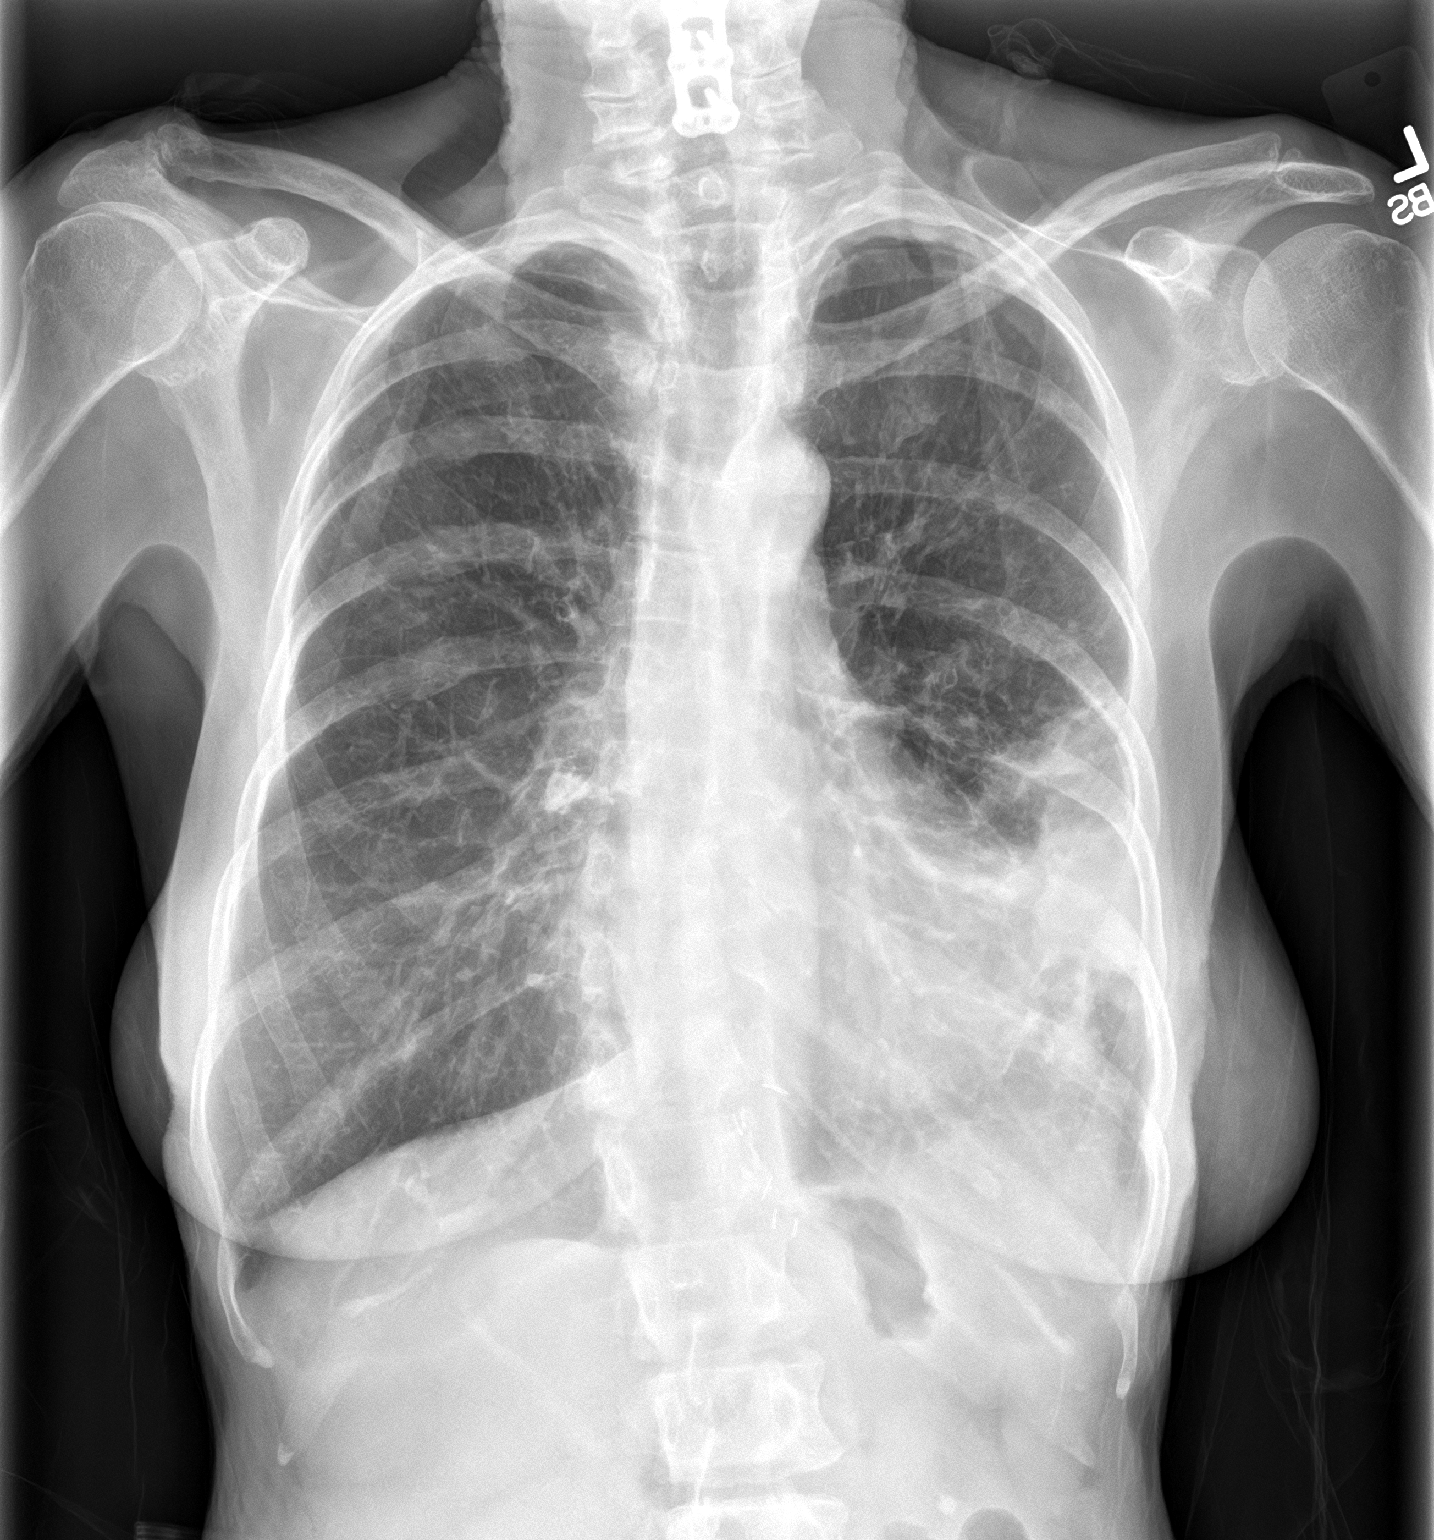
[im 2/2]
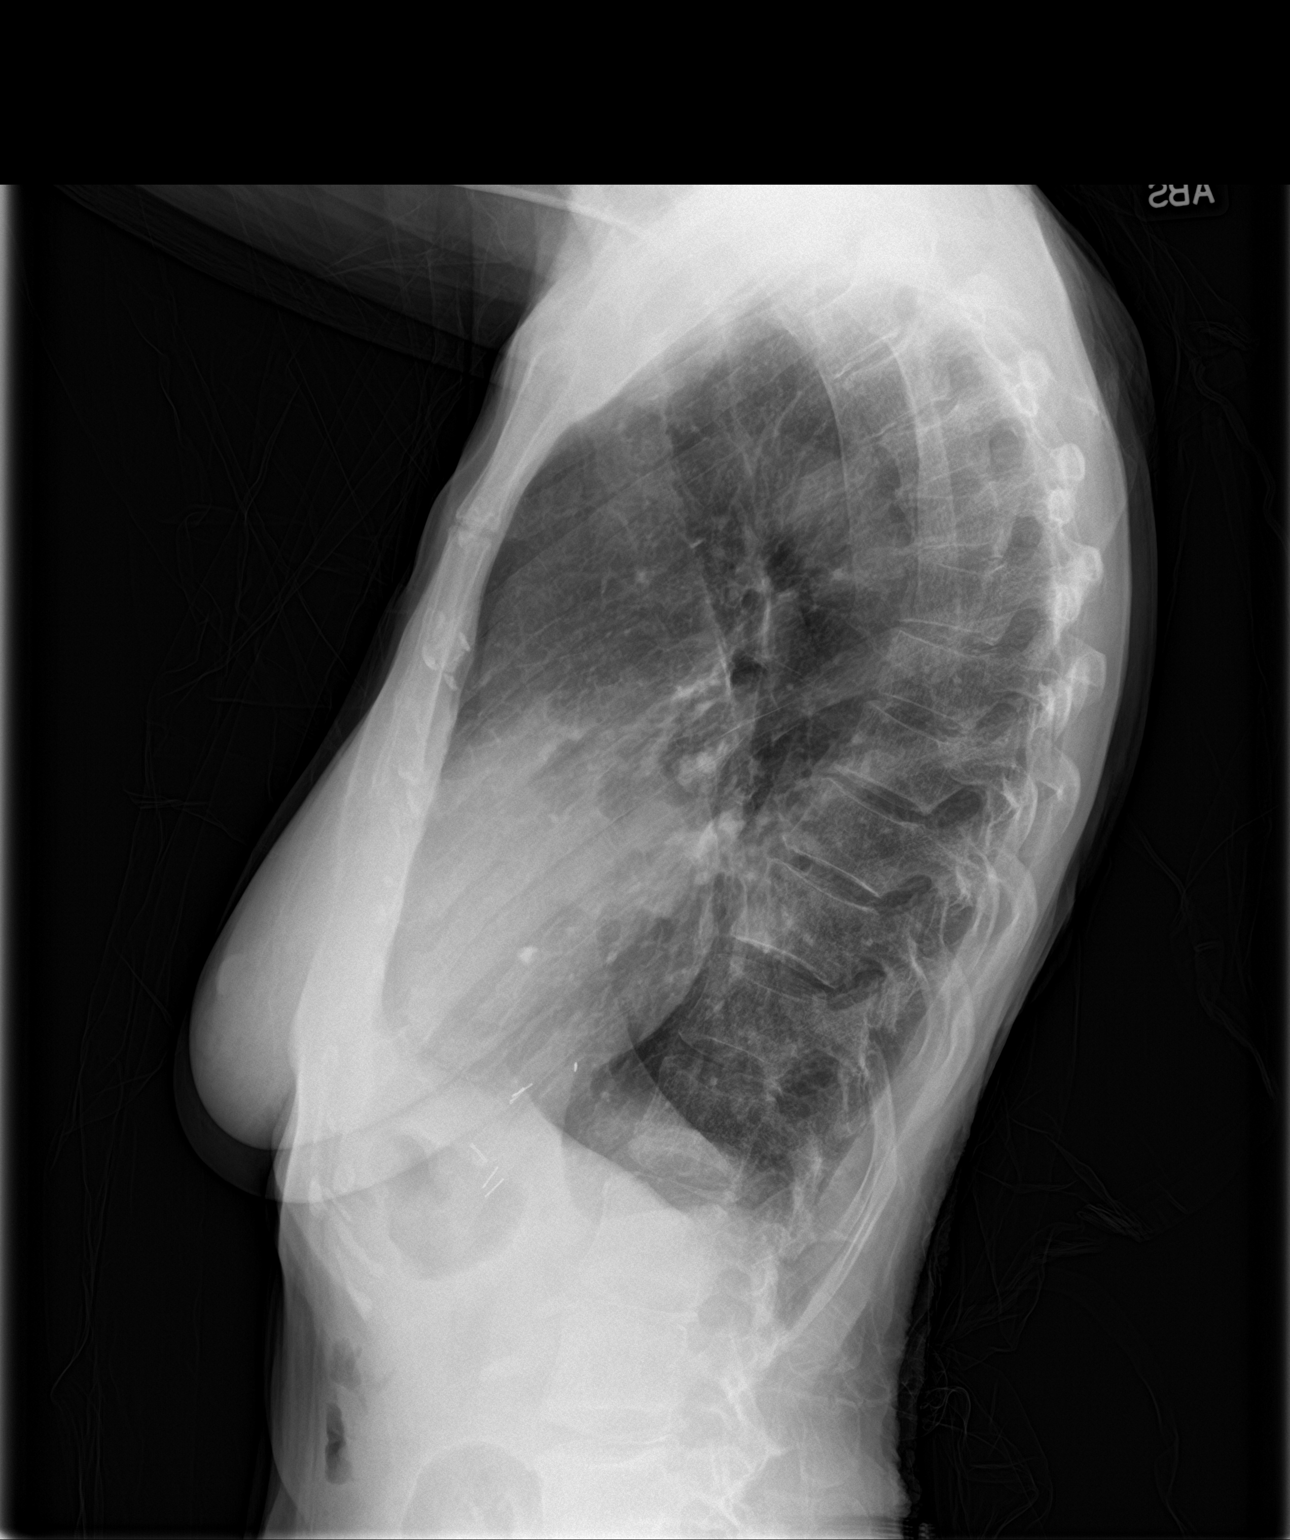

[2 of 2 positions shown; findings below may reference images not displayed]

FINDINGS: The heart size and mediastinal contours are within normal limits.
Calcified right hilar lymph nodes. There is a masslike opacity of
the peripheral left lung, with an appearance suspicious for internal
cavitation. Multiple nonacute fractures of the posterior right ribs.
IMPRESSION: There is a masslike opacity of the peripheral left lung, with an
appearance suspicious for internal cavitation. Findings are most
concerning for malignancy. Cavitary infection is a differential
consideration. Recommend CT to further evaluate.

## 2021-11-25 ENCOUNTER — Ambulatory Visit: Payer: Medicare PPO | Admitting: Dermatology

## 2021-11-25 ENCOUNTER — Other Ambulatory Visit: Payer: Self-pay

## 2021-11-25 DIAGNOSIS — Z1283 Encounter for screening for malignant neoplasm of skin: Secondary | ICD-10-CM

## 2021-11-25 DIAGNOSIS — L409 Psoriasis, unspecified: Secondary | ICD-10-CM

## 2021-11-25 DIAGNOSIS — L578 Other skin changes due to chronic exposure to nonionizing radiation: Secondary | ICD-10-CM | POA: Diagnosis not present

## 2021-11-25 DIAGNOSIS — L821 Other seborrheic keratosis: Secondary | ICD-10-CM

## 2021-11-25 DIAGNOSIS — D1800 Hemangioma unspecified site: Secondary | ICD-10-CM

## 2021-11-25 DIAGNOSIS — T148XXA Other injury of unspecified body region, initial encounter: Secondary | ICD-10-CM

## 2021-11-25 DIAGNOSIS — D692 Other nonthrombocytopenic purpura: Secondary | ICD-10-CM

## 2021-11-25 DIAGNOSIS — L820 Inflamed seborrheic keratosis: Secondary | ICD-10-CM | POA: Diagnosis not present

## 2021-11-25 DIAGNOSIS — L814 Other melanin hyperpigmentation: Secondary | ICD-10-CM

## 2021-11-25 NOTE — Progress Notes (Signed)
Follow-Up Visit   Subjective  Renee Cabrera is a 81 y.o. female who presents for the following: Annual Exam (Patient here for full body skin exam and skin cancer screening. Patient with no hx of skin cancer. 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 Patient does have a spot at the back of her scalp, a scaly spot at left cheek, present for months. ).  Also a spot on her arm that came up after her cat scratched her several months ago.  Patient with hx of lupus, in remission.  The following portions of the chart were reviewed this encounter and updated as appropriate:       Review of Systems:  No other skin or systemic complaints except as noted in HPI or Assessment and Plan.  Objective  Well appearing patient in no apparent distress; mood and affect are within normal limits.  A full examination was performed including scalp, head, eyes, ears, nose, lips, neck, chest, axillae, abdomen, back, buttocks, bilateral upper extremities, bilateral lower extremities, hands, feet, fingers, toes, fingernails, and toenails. All findings within normal limits unless otherwise noted below.  Scalp Pink crusted papule at occipital hairline  Left Forearm Erythematous stuck-on, waxy papule   right mid cheek Excoriation     Assessment & Plan  Psoriasis Scalp  Psoriasis is a chronic non-curable, but treatable genetic/hereditary disease that may have other systemic features affecting other organ systems such as joints (Psoriatic Arthritis). It is associated with an increased risk of inflammatory bowel disease, heart disease, non-alcoholic fatty liver disease, and depression.    Continue ketoconazole 2% shampoo apply three times per week, massage into scalp and leave in for 5-10 minutes before rinsing out.  Start Taclonex nightly to affected area at back of scalp. Patient will RTC if not improving at occipital  hairline.  Related Medications ketoconazole (NIZORAL) 2 % shampoo APPLY 1 APPLICATION TOPICALLY DAILY. MASSAGE INTO SCALP AND LEAVE IN FOR 10 MINUTES BEFORE RINSING OUT  Inflamed seborrheic keratosis Left Forearm  Destruction of lesion - Left Forearm  Destruction method: cryotherapy   Informed consent: discussed and consent obtained   Lesion destroyed using liquid nitrogen: Yes   Region frozen until ice ball extended beyond lesion: Yes   Outcome: patient tolerated procedure well with no complications   Post-procedure details: wound care instructions given   Additional details:  Prior to procedure, discussed risks of blister formation, small wound, skin dyspigmentation, or rare scar following cryotherapy. Recommend Vaseline ointment to treated areas while healing.   Excoriation right mid cheek  Benign-appearing.  Observation.  Call clinic for new or changing lesions.  Recommend daily use of broad spectrum spf 30+ sunscreen to sun-exposed areas.     Lentigines - Scattered tan macules - Due to sun exposure - Benign-appearing, observe - Recommend daily broad spectrum sunscreen SPF 30+ to sun-exposed areas, reapply every 2 hours as needed. - Call for any changes  Seborrheic Keratoses - Stuck-on, waxy, tan-brown papules, one at left jaw, some at abdomen not bothersome for patient - Benign-appearing - Discussed benign etiology and prognosis. - Observe - Call for any changes  Hemangiomas - Red papules - Discussed benign nature - Observe - Call for any changes  Actinic Damage - Chronic condition, secondary to cumulative UV/sun exposure - diffuse scaly erythematous macules with underlying dyspigmentation - Recommend daily broad spectrum sunscreen SPF 30+ to sun-exposed areas, reapply every 2 hours as  needed.  - Staying in the shade or wearing long sleeves, sun glasses (UVA+UVB protection) and wide brim hats (4-inch brim around the entire circumference of the hat) are also  recommended for sun protection.  - Call for new or changing lesions.  Skin cancer screening performed today.  Purpura - Chronic; persistent and recurrent.  Treatable, but not curable. - Violaceous macules and patches - Benign - Related to trauma, age, sun damage and/or use of blood thinners, chronic use of topical and/or oral steroids - Observe - Can use OTC arnica containing moisturizer such as Dermend Bruise Formula if desired - Call for worsening or other concerns  Return in about 1 year (around 11/25/2022) for TBSE.  Graciella Belton, RMA, am acting as scribe for Brendolyn Patty, MD .  Documentation: I have reviewed the above documentation for accuracy and completeness, and I agree with the above.  Brendolyn Patty MD

## 2021-11-25 NOTE — Patient Instructions (Addendum)
Psoriasis is a chronic non-curable, but treatable genetic/hereditary disease that may have other systemic features affecting other organ systems such as joints (Psoriatic Arthritis). It is associated with an increased risk of inflammatory bowel disease, heart disease, non-alcoholic fatty liver disease, and depression.    Continue ketoconazole 2% shampoo apply three times per week, massage into scalp and leave in for 10 minutes before rinsing out.  Start Taclonex nightly to affected area at back of scalp.  Cryotherapy Aftercare  Wash gently with soap and water everyday.   Apply Vaseline and Band-Aid daily until healed.    Melanoma ABCDEs  Melanoma is the most dangerous type of skin cancer, and is the leading cause of death from skin disease.  You are more likely to develop melanoma if you: Have light-colored skin, light-colored eyes, or red or blond hair Spend a lot of time in the sun Tan regularly, either outdoors or in a tanning bed Have had blistering sunburns, especially during childhood Have a close family member who has had a melanoma Have atypical moles or large birthmarks  Early detection of melanoma is key since treatment is typically straightforward and cure rates are extremely high if we catch it early.   The first sign of melanoma is often a change in a mole or a new dark spot.  The ABCDE system is a way of remembering the signs of melanoma.  A for asymmetry:  The two halves do not match. B for border:  The edges of the growth are irregular. C for color:  A mixture of colors are present instead of an even brown color. D for diameter:  Melanomas are usually (but not always) greater than 80mm - the size of a pencil eraser. E for evolution:  The spot keeps changing in size, shape, and color.  Please check your skin once per month between visits. You can use a small mirror in front and a large mirror behind you to keep an eye on the back side or your body.   If you see any new  or changing lesions before your next follow-up, please call to schedule a visit.  Please continue daily skin protection including broad spectrum sunscreen SPF 30+ to sun-exposed areas, reapplying every 2 hours as needed when you're outdoors.    If You Need Anything After Your Visit  If you have any questions or concerns for your doctor, please call our main line at [phone] and press option 4 to reach your doctor's medical assistant. If no one answers, please leave a voicemail as directed and we will return your call as soon as possible. Messages left after 4 pm will be answered the following business day.   You may also send [place] a message via [place]. We typically respond to MyChart messages within 1-2 business days.  For prescription refills, please ask your pharmacy to contact our office. Our fax number is [phone].  If you have an urgent issue when the clinic is closed that cannot wait until the next business day, you can page your doctor at the number below.    Please note that while we do our best to be available for urgent issues outside of office hours, we are not available 24/7.   If you have an urgent issue and are unable to reach [place], you may choose to seek medical care at your doctor's office, retail clinic, urgent care center, or emergency room.  If you have a medical emergency, please immediately call 911 or go to the  emergency department.  Pager Numbers  - Dr. Nehemiah Massed: [phone]  - Dr. Laurence Ferrari: [phone]  - Dr. Nicole Kindred: [phone]  In the event of inclement weather, please call our main line at [phone] for an update on the status of any delays or closures.  Dermatology Medication Tips: Please keep the boxes that topical medications come in in order to help keep track of the instructions about where and how to use these. Pharmacies typically print the medication instructions only on the boxes and not directly on the medication tubes.   If your  medication is too expensive, please contact our office at [phone] option 4 or send [place] a message through [place].   We are unable to tell what your co-pay for medications will be in advance as this is different depending on your insurance coverage. However, we may be able to find a substitute medication at lower cost or fill out paperwork to get insurance to cover a needed medication.   If a prior authorization is required to get your medication covered by your insurance company, please allow [place] 1-2 business days to complete this process.  Drug prices often vary depending on where the prescription is filled and some pharmacies may offer cheaper prices.  The website www.goodrx.com contains coupons for medications through different pharmacies. The prices here do not account for what the cost may be with help from insurance (it may be cheaper with your insurance), but the website can give you the price if you did not use any insurance.  - You can print the associated coupon and take it with your prescription to the pharmacy.  - You may also stop by our office during regular business hours and pick up a GoodRx coupon card.  - If you need your prescription sent electronically to a different pharmacy, notify our office through [facility] or by phone at [phone] option 4.     Si Usted Necesita Algo Despus de Su Visita  Tambin puede enviarnos un mensaje a travs de Pharmacist, community. Por lo general respondemos a los mensajes de MyChart en el transcurso de 1 a 2 das hbiles.  Para renovar recetas, por favor pida a su farmacia que se ponga en contacto con nuestra oficina. Harland Dingwall de fax es [place] [phone].  Si tiene un asunto urgente cuando la clnica est cerrada y que no puede esperar hasta el siguiente da hbil, puede llamar/localizar a su doctor(a) al nmero que aparece a continuacin.   Por favor, tenga en cuenta que aunque hacemos todo lo posible para estar disponibles para  asuntos urgentes fuera del horario de [place], no estamos disponibles las 24 horas del da, los 7 das de la [place].   Si tiene un problema urgente y no puede comunicarse con nosotros, puede optar por buscar atencin mdica  en el consultorio de su doctor(a), en una clnica privada, en un centro de atencin urgente o en una sala de emergencias.  Si tiene Engineering geologist, por favor llame inmediatamente al 911 o vaya a la sala de emergencias.  Nmeros de bper  - Dr. Nehemiah Massed: [phone]  - Dra. Moye: [phone]  - Dra. Nicole Kindred: [phone]  En caso de inclemencias del [place], por favor llame a Johnsie Kindred principal al [phone] para una actualizacin sobre el [place] de cualquier retraso o cierre.  Consejos para la medicacin en dermatologa: Por favor, guarde las cajas en las que vienen los medicamentos de uso tpico para ayudarle a seguir las instrucciones sobre dnde y cmo usarlos. Las  farmacias generalmente imprimen las instrucciones del medicamento slo en las cajas y no directamente en los tubos del [place].   Si su medicamento es muy caro, por favor, pngase en contacto con Zigmund Daniel llamando al [phone] y presione la opcin 4 o envenos un mensaje a travs de Pharmacist, community.   No podemos decirle cul ser su copago por los medicamentos por adelantado ya que esto es diferente dependiendo de la cobertura de su seguro. Sin embargo, es posible que podamos encontrar un medicamento sustituto a Electrical engineer un formulario para que el seguro cubra el medicamento que se considera necesario.   Si se requiere una autorizacin previa para que su compaa de seguros [place] su medicamento, por favor permtanos de 1 a 2 das hbiles para completar este proceso.  Los precios de los medicamentos varan con frecuencia dependiendo del Environmental consultant de dnde se surte la receta y alguna farmacias pueden ofrecer precios ms baratos.  El sitio web www.goodrx.com tiene cupones para  medicamentos de Airline pilot. Los precios aqu no tienen en cuenta lo que podra costar con la ayuda del seguro (puede ser ms barato con su seguro), pero el sitio web puede darle el precio si no utiliz Research scientist (physical sciences).  - Puede imprimir el cupn correspondiente y llevarlo con su receta a la farmacia.  - Tambin puede pasar por nuestra oficina durante el horario de atencin regular y Charity fundraiser una tarjeta de cupones de GoodRx.  - Si necesita que su receta se enve electrnicamente a una farmacia diferente, informe a nuestra oficina a travs de MyChart de [place] o por telfono llamando al [phone] y presione la opcin 4.

## 2021-12-10 ENCOUNTER — Other Ambulatory Visit: Payer: Self-pay | Admitting: Family Medicine

## 2021-12-10 DIAGNOSIS — Z1231 Encounter for screening mammogram for malignant neoplasm of breast: Secondary | ICD-10-CM

## 2022-01-15 ENCOUNTER — Other Ambulatory Visit: Payer: Self-pay

## 2022-01-15 ENCOUNTER — Ambulatory Visit
Admission: RE | Admit: 2022-01-15 | Discharge: 2022-01-15 | Disposition: A | Discharge status: Home / Self Care | Payer: Medicare PPO | Source: Ambulatory Visit | Attending: Family Medicine | Admitting: Family Medicine

## 2022-01-15 DIAGNOSIS — Z1231 Encounter for screening mammogram for malignant neoplasm of breast: Secondary | ICD-10-CM | POA: Insufficient documentation

## 2022-02-14 ENCOUNTER — Other Ambulatory Visit: Payer: Self-pay | Admitting: Pulmonary Disease

## 2022-02-14 ENCOUNTER — Other Ambulatory Visit (HOSPITAL_COMMUNITY): Payer: Self-pay | Admitting: Pulmonary Disease

## 2022-02-14 DIAGNOSIS — R918 Other nonspecific abnormal finding of lung field: Secondary | ICD-10-CM

## 2022-02-26 ENCOUNTER — Encounter (HOSPITAL_COMMUNITY)
Admission: RE | Admit: 2022-02-26 | Discharge: 2022-02-26 | Disposition: A | Discharge status: Home / Self Care | Payer: Medicare PPO | Source: Ambulatory Visit | Attending: Pulmonary Disease | Admitting: Pulmonary Disease

## 2022-02-26 DIAGNOSIS — R918 Other nonspecific abnormal finding of lung field: Secondary | ICD-10-CM | POA: Diagnosis not present

## 2022-02-26 LAB — GLUCOSE, CAPILLARY: Glucose-Capillary: 91 mg/dL (ref 70–99)

## 2022-02-26 MED ORDER — FLUDEOXYGLUCOSE F - 18 (FDG) INJECTION
4.9100 | Freq: Once | INTRAVENOUS | Status: AC | PRN
  Administered 2022-02-26: 4.91 via INTRAVENOUS

## 2022-03-12 ENCOUNTER — Inpatient Hospital Stay: Payer: Medicare PPO | Attending: Internal Medicine

## 2022-03-12 DIAGNOSIS — N183 Chronic kidney disease, stage 3 unspecified: Secondary | ICD-10-CM | POA: Diagnosis not present

## 2022-03-12 DIAGNOSIS — D508 Other iron deficiency anemias: Secondary | ICD-10-CM

## 2022-03-12 DIAGNOSIS — E538 Deficiency of other specified B group vitamins: Secondary | ICD-10-CM | POA: Insufficient documentation

## 2022-03-12 DIAGNOSIS — I129 Hypertensive chronic kidney disease with stage 1 through stage 4 chronic kidney disease, or unspecified chronic kidney disease: Secondary | ICD-10-CM | POA: Insufficient documentation

## 2022-03-12 DIAGNOSIS — R634 Abnormal weight loss: Secondary | ICD-10-CM | POA: Diagnosis not present

## 2022-03-12 DIAGNOSIS — N200 Calculus of kidney: Secondary | ICD-10-CM | POA: Diagnosis not present

## 2022-03-12 DIAGNOSIS — D509 Iron deficiency anemia, unspecified: Secondary | ICD-10-CM | POA: Diagnosis not present

## 2022-03-12 LAB — CBC WITH DIFFERENTIAL/PLATELET
Abs Immature Granulocytes: 0.14 10*3/uL — ABNORMAL HIGH (ref 0.00–0.07)
Basophils Absolute: 0.1 10*3/uL (ref 0.0–0.1)
Basophils Relative: 1 %
Eosinophils Absolute: 0.1 10*3/uL (ref 0.0–0.5)
Eosinophils Relative: 1 %
HCT: 40.7 % (ref 36.0–46.0)
Hemoglobin: 13.2 g/dL (ref 12.0–15.0)
Immature Granulocytes: 2 %
Lymphocytes Relative: 28 %
Lymphs Abs: 2.7 10*3/uL (ref 0.7–4.0)
MCH: 31 pg (ref 26.0–34.0)
MCHC: 32.4 g/dL (ref 30.0–36.0)
MCV: 95.5 fL (ref 80.0–100.0)
Monocytes Absolute: 0.6 10*3/uL (ref 0.1–1.0)
Monocytes Relative: 6 %
Neutro Abs: 5.9 10*3/uL (ref 1.7–7.7)
Neutrophils Relative %: 62 %
Platelets: 518 10*3/uL — ABNORMAL HIGH (ref 150–400)
RBC: 4.26 MIL/uL (ref 3.87–5.11)
RDW: 13.9 % (ref 11.5–15.5)
WBC: 9.5 10*3/uL (ref 4.0–10.5)
nRBC: 0 % (ref 0.0–0.2)

## 2022-03-12 LAB — BASIC METABOLIC PANEL
Anion gap: 8 (ref 5–15)
BUN: 9 mg/dL (ref 8–23)
CO2: 19 mmol/L — ABNORMAL LOW (ref 22–32)
Calcium: 7.8 mg/dL — ABNORMAL LOW (ref 8.9–10.3)
Chloride: 114 mmol/L — ABNORMAL HIGH (ref 98–111)
Creatinine, Ser: 1.19 mg/dL — ABNORMAL HIGH (ref 0.44–1.00)
GFR, Estimated: 46 mL/min — ABNORMAL LOW (ref >60)
Glucose, Bld: 80 mg/dL (ref 70–99)
Potassium: 4.5 mmol/L (ref 3.5–5.1)
Sodium: 141 mmol/L (ref 135–145)

## 2022-03-12 LAB — IRON AND TIBC
Iron: 91 ug/dL (ref 28–170)
Saturation Ratios: 33 % — ABNORMAL HIGH (ref 10.4–31.8)
TIBC: 280 ug/dL (ref 250–450)
UIBC: 189 ug/dL

## 2022-03-12 LAB — C-REACTIVE PROTEIN: CRP: 0.5 mg/dL (ref <1.0)

## 2022-03-12 LAB — FERRITIN: Ferritin: 116 ng/mL (ref 11–307)

## 2022-03-14 ENCOUNTER — Inpatient Hospital Stay: Payer: Medicare PPO

## 2022-03-14 ENCOUNTER — Encounter: Payer: Self-pay | Admitting: Internal Medicine

## 2022-03-14 ENCOUNTER — Inpatient Hospital Stay: Payer: Medicare PPO | Admitting: Internal Medicine

## 2022-03-14 DIAGNOSIS — D508 Other iron deficiency anemias: Secondary | ICD-10-CM

## 2022-03-14 DIAGNOSIS — K9589 Other complications of other bariatric procedure: Secondary | ICD-10-CM

## 2022-03-14 DIAGNOSIS — D509 Iron deficiency anemia, unspecified: Secondary | ICD-10-CM | POA: Diagnosis not present

## 2022-03-14 NOTE — Assessment & Plan Note (Addendum)
#   Iron deficient anemia-s/p partial gastrectomy-hemoglobin stable-13.0 saturation 33%;Ferritin-116 hold off IV iron.  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  [avoid nephrotoxins]- recommend Fluids.   # B12 deficiency secondary to surgery- continue parental B12 with PCP--STABLE.   # weight loss/fatigue: awaiting EGD/coloscopy [Dr.Russow] PET scan [Dr.A]- Negative.  S/p evaluation with Joli.   # Bilateral kidney stones-incidental on imaging.  Awaiting urology evaluation.  # DISPOSITION: # HOLD venofer today # follow up in 6 month- MD-possible Venofer-; labs- 1-2 days prior- cbc/BMP/crp/iron studies/ferritin/ Dr.B  Cc: Dr.Hedrick

## 2022-03-14 NOTE — Progress Notes (Signed)
[place] OFFICE PROGRESS NOTE  Patient Care Team: [name], MD as PCP - General (Family Medicine) Cammie Sickle, MD as Consulting Physician (Hematology and Oncology)   Cancer Staging  No matching staging information was found for the patient.   Oncology History   No history exists.  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 Patient also intolerant of oral iron  -   received parenteral iron therapy with Feraheme 510 mg x 2 doses in May 2014.   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  Ambulating independently  Renee Cabrera 81 y.o.  female pleasant patient above history of Iron deficiency anemia status post partial gastrectomy is here for follow-up.  Evaluated by GI for dysphagia awaiting endoscopies this month.   In the interim patient had a PET scan-negative for any acute process.   Unfortunately continue lose weight.  Denies any nausea vomiting abdominal pain.  No blood in stools or black-colored stools.   Review of Systems  Constitutional:  Positive for malaise/fatigue and weight loss. Negative for chills, diaphoresis and fever.  HENT:  Negative for nosebleeds and sore throat.   Eyes:  Negative for double vision.  Respiratory:  Negative for wheezing.   Cardiovascular:  Negative for chest pain, palpitations, orthopnea and leg swelling.  Gastrointestinal:  Negative for abdominal pain, blood in stool, constipation, diarrhea, heartburn, melena, nausea and vomiting.       Sometimes- dysphagia  Genitourinary:  Negative for dysuria, frequency and urgency.  Musculoskeletal:  Negative for back pain and joint pain.  Skin: Negative.  Negative for itching and rash.  Neurological:  Negative for dizziness, tingling, focal weakness, weakness and headaches.   Endo/Heme/Allergies:  Does not bruise/bleed easily.  Psychiatric/Behavioral:  Negative for depression. The patient is not nervous/anxious and does not have insomnia.   PAST MEDICAL HISTORY :  Past Medical History:  Diagnosis Date   Anemia    IRON INFUSIONS   Arthritis    osteoarthritis/ RA   Asthma    remote h/o asthma   Depression    controlled   H/O hypoglycemia    H/O: GI bleed    Headache(784.0)    h/o migraines   Hearing loss, bilateral    wears hearing aides   History of blood transfusion    pt states she had yellow jaundice   Hypertension    H/O NO TX NOW   Lupus (systemic lupus erythematosus) ([place]) 1995   in remission since 2005   Nasal sinus congestion    Osteoporosis with fracture    Stress fracture of foot    left foot   Vitamin B 12 deficiency     PAST SURGICAL HISTORY :   Past Surgical History:  Procedure Laterality Date   ANTERIOR CERVICAL DECOMP/DISCECTOMY FUSION  2009   BACK SURGERY     CATARACT EXTRACTION W/PHACO Left 09/30/2016   Procedure: CATARACT EXTRACTION PHACO AND INTRAOCULAR LENS PLACEMENT ([place]);  Surgeon: Birder Robson, MD;  Location: [facility] ORS;  Service: Ophthalmology;  Laterality: Left;  Lot# [id number] H [place]: 00:43.6 AP%: 19.6 CDE: 8.55   CATARACT EXTRACTION W/PHACO Right 01/11/2019   Procedure: CATARACT EXTRACTION PHACO AND INTRAOCULAR LENS PLACEMENT ([place]) RIGHT;  Surgeon: Birder Robson, MD;  Location: [facility] ORS;  Service: Ophthalmology;  Laterality: Right;  [place] 01:03.9 CDE 9.19 Fluid pack Lot # [id number]   INTRAMEDULLARY (  IM) NAIL INTERTROCHANTERIC Right 07/01/2016   Procedure: INTRAMEDULLARY (IM) NAIL EXCHANGE OF FEMORAL ROD;  Surgeon: Hessie Knows, MD;  Location: [facility] ORS;  Service: Orthopedics;  Laterality: Right;   ORIF FEMUR FRACTURE  2012   PARTIAL GASTRECTOMY  1963   and reconstruction 1973   TONSILLECTOMY  1945    FAMILY HISTORY :   Family History  Problem Relation Age of Onset   Breast cancer Neg Hx     SOCIAL HISTORY:    Social History   Tobacco Use   Smoking status: Never   Smokeless tobacco: Never  Substance Use Topics   Alcohol use: No   Drug use: No    ALLERGIES:  is allergic to sulfa antibiotics.  MEDICATIONS:  Current Outpatient Medications  Medication Sig Dispense Refill   ascorbic Acid (VITAMIN C) 500 MG CPCR Take by mouth.     aspirin EC 81 MG tablet Take 81 mg by mouth daily.     BELSOMRA 10 MG TABS Take 10 mg by mouth at bedtime.   5   Calcium Carbonate-Vitamin D 600-400 MG-UNIT tablet Take by mouth.     cholecalciferol (VITAMIN D) 1000 UNITS tablet Take 1,000 Units by mouth every evening.      clobetasol (TEMOVATE) 0.05 % external solution MIX IN A TUB OF CERAVE CREAM AND USE EVERYDAY UP TO TWICE DAILY ON ITCHY RASH UNTIL CLEAR, AVOID [place], [place], [place]. 50 mL 0   cyanocobalamin (,VITAMIN B-12,) 1000 MCG/ML injection Inject 1,000 mcg into the muscle every 30 (thirty) days.      denosumab (PROLIA) 60 MG/ML SOSY injection Inject 60 mg into the skin every 6 (six) months.      Fe Fum-FA-B Cmp-C-Zn-Mg-Mn-Cu (HEMATINIC PLUS VIT/MINERALS) 106-1 MG TABS Take 1 tablet by mouth daily with lunch.   3   gabapentin (NEURONTIN) 100 MG capsule Take 100-200 mg by mouth See admin instructions. Take 1 capsule (100 mg) by mouth in the morning & take 2 capsules (200 mg) by mouth at night.     HYDROcodone-acetaminophen (NORCO/VICODIN) 5-325 MG tablet Take 1 tablet by mouth every 4 (four) hours as needed (pain.).     ketoconazole (NIZORAL) 2 % shampoo APPLY 1 APPLICATION TOPICALLY DAILY. MASSAGE INTO SCALP AND LEAVE IN FOR 10 MINUTES BEFORE RINSING OUT 120 mL 4   megestrol (MEGACE) 40 MG/ML suspension Take 800 mg by mouth daily.     meloxicam (MOBIC) 7.5 MG tablet Take 1 tablet by mouth daily.     mirtazapine (REMERON) 30 MG tablet Take 30 mg by mouth at bedtime.     Multiple Vitamins-Minerals (PRESERVISION AREDS) TABS Take 1 tablet by mouth 2 (two) times daily.      pantoprazole (PROTONIX) 40 MG tablet Take  40 mg by mouth daily before supper.   1   Suvorexant (BELSOMRA) 10 MG TABS Take 1 tablet by mouth daily.     Travoprost, BAK Free, (TRAVATAN) 0.004 % SOLN ophthalmic solution SMARTSIG:1 Drop(s) In Eye(s) Every Evening     travoprost, benzalkonium, (TRAVATAN) 0.004 % ophthalmic solution Place 1 drop into both eyes at bedtime.     venlafaxine XR (EFFEXOR-XR) 37.5 MG 24 hr capsule Take by mouth.     vitamin C (ASCORBIC ACID) 500 MG tablet Take 500 mg by mouth 2 (two) times daily.     Calcium Carb-Cholecalciferol (CALTRATE 600+D3 PO) Take 1 tablet by mouth 2 (two) times daily. (Patient not taking: Reported on 09/13/2021)     citalopram (CELEXA) 20 MG tablet Take 20 mg  by mouth daily at 8 pm. (1900) (Patient not taking: Reported on 09/13/2021)     citalopram (CELEXA) 20 MG tablet Take 1 tablet by mouth daily. (Patient not taking: Reported on 09/13/2021)     Fe Fum-FA-B Cmp-C-Zn-Mg-Mn-Cu (HEMATINIC PLUS VIT/MINERALS) 106-1 MG TABS Take 1 tablet by mouth daily. (Patient not taking: Reported on 09/13/2021)     megestrol (MEGACE) 40 MG/ML suspension Take by mouth. (Patient not taking: Reported on 09/13/2021)     meloxicam (MOBIC) 7.5 MG tablet Take 7.5 mg by mouth daily with supper.  (Patient not taking: Reported on 09/13/2021)     mirtazapine (REMERON) 45 MG tablet Take 1 tablet by mouth at bedtime. (Patient not taking: Reported on 09/13/2021)     mirtazapine (REMERON) 45 MG tablet  (Patient not taking: Reported on 09/13/2021)     pantoprazole (PROTONIX) 40 MG tablet Take 1 tablet by mouth daily. (Patient not taking: Reported on 09/13/2021)     No current facility-administered medications for this visit.    PHYSICAL EXAMINATION: ECOG PERFORMANCE STATUS: 0 - Asymptomatic  BP (!) 158/80 (BP Location: Left Arm, Patient Position: Sitting, Cuff Size: Small)   Pulse 99   Temp (!) 101 F (38.3 C) (Tympanic)   Ht '4\' 11"'$  (1.499 m)   Wt 81 lb 9.6 oz (37 kg)   SpO2 100%   BMI 16.48 kg/m   Filed Weights    03/14/22 1255  Weight: 81 lb 9.6 oz (37 kg)    Physical Exam Constitutional:      Comments: Thin built Caucasian female patient.  HENT:     Head: Normocephalic and atraumatic.     Mouth/Throat:     Pharynx: No oropharyngeal exudate.  Eyes:     Pupils: Pupils are equal, round, and reactive to light.  Cardiovascular:     Rate and Rhythm: Normal rate and regular rhythm.  Pulmonary:     Effort: Pulmonary effort is normal. No respiratory distress.     Breath sounds: Normal breath sounds. No wheezing.  Abdominal:     General: Bowel sounds are normal. There is no distension.     Palpations: Abdomen is soft. There is no mass.     Tenderness: There is no abdominal tenderness. There is no guarding or rebound.  Musculoskeletal:        General: No tenderness. Normal range of motion.     Cervical back: Normal range of motion and neck supple.  Skin:    General: Skin is warm.  Neurological:     Mental Status: She is alert and oriented to person, place, and time.  Psychiatric:        Mood and Affect: Affect normal.     LABORATORY DATA:  I have reviewed the data as listed    Component Value Date/Time   NA 141 03/12/2022 1113   NA 142 02/09/2013 0515   K 4.5 03/12/2022 1113   K 4.0 02/09/2013 0515   CL 114 (H) 03/12/2022 1113   CL 113 (H) 02/09/2013 0515   CO2 19 (L) 03/12/2022 1113   CO2 25 02/09/2013 0515   GLUCOSE 80 03/12/2022 1113   GLUCOSE 80 02/09/2013 0515   BUN 9 03/12/2022 1113   BUN 20 (H) 02/09/2013 0515   CREATININE 1.19 (H) 03/12/2022 1113   CREATININE 0.58 (L) 02/09/2013 0515   CALCIUM 7.8 (L) 03/12/2022 1113   CALCIUM 7.5 (L) 02/09/2013 0515   PROT 7.2 09/11/2021 1043   PROT 6.8 02/08/2013 1558   ALBUMIN 3.9 09/11/2021 1043  ALBUMIN 3.8 02/08/2013 1558   AST 18 09/11/2021 1043   AST 16 02/08/2013 1558   ALT 11 09/11/2021 1043   ALT 12 02/08/2013 1558   ALKPHOS 52 09/11/2021 1043   ALKPHOS 53 02/08/2013 1558   BILITOT 0.1 (L) 09/11/2021 1043   BILITOT  0.2 02/08/2013 1558   GFRNONAA 46 (L) 03/12/2022 1113   GFRNONAA >60 02/09/2013 0515   GFRAA >60 10/06/2019 0902   GFRAA >60 02/09/2013 0515    No results found for: SPEP, UPEP  Lab Results  Component Value Date   WBC 9.5 03/12/2022   NEUTROABS 5.9 03/12/2022   HGB 13.2 03/12/2022   HCT 40.7 03/12/2022   MCV 95.5 03/12/2022   PLT 518 (H) 03/12/2022      Chemistry      Component Value Date/Time   NA 141 03/12/2022 1113   NA 142 02/09/2013 0515   K 4.5 03/12/2022 1113   K 4.0 02/09/2013 0515   CL 114 (H) 03/12/2022 1113   CL 113 (H) 02/09/2013 0515   CO2 19 (L) 03/12/2022 1113   CO2 25 02/09/2013 0515   BUN 9 03/12/2022 1113   BUN 20 (H) 02/09/2013 0515   CREATININE 1.19 (H) 03/12/2022 1113   CREATININE 0.58 (L) 02/09/2013 0515      Component Value Date/Time   CALCIUM 7.8 (L) 03/12/2022 1113   CALCIUM 7.5 (L) 02/09/2013 0515   ALKPHOS 52 09/11/2021 1043   ALKPHOS 53 02/08/2013 1558   AST 18 09/11/2021 1043   AST 16 02/08/2013 1558   ALT 11 09/11/2021 1043   ALT 12 02/08/2013 1558   BILITOT 0.1 (L) 09/11/2021 1043   BILITOT 0.2 02/08/2013 1558       RADIOGRAPHIC STUDIES: I have personally reviewed the radiological images as listed and agreed with the findings in the report. No results found.   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  [avoid nephrotoxins]- recommend Fluids.   # B12 deficiency secondary to surgery- continue parental B12 with PCP--STABLE.   # weight loss/fatigue: awaiting EGD/coloscopy [Dr.Russow] PET scan [Dr.A]- Negative.  S/p evaluation with Joli.   # Bilateral kidney stones-incidental on imaging.  Awaiting urology evaluation.  # DISPOSITION: # HOLD venofer today # follow up in 6 month- MD-possible Venofer-; labs- 1-2 days prior-  cbc/BMP/crp/iron studies/ferritin/ Dr.B  Cc: Dr.Hedrick   Orders Placed This Encounter  Procedures   C-reactive protein    Standing Status:   Future    Standing Expiration Date:   03/15/2023   CBC with Differential/Platelet    Standing Status:   Future    Standing Expiration Date:   04/22/349   Basic metabolic panel    Standing Status:   Future    Standing Expiration Date:   03/15/2023   Iron and TIBC    Standing Status:   Future    Standing Expiration Date:   03/15/2023   Ferritin    Standing Status:   Future    Standing Expiration Date:   03/15/2023   All questions were answered. The patient knows to call the clinic with any problems, questions or concerns.      Cammie Sickle, MD 03/14/2022 1:35 PM

## 2022-03-14 NOTE — Progress Notes (Signed)
C/o fatigue.  Seeing urology for kidney stones.  Having endoscopy and colonoscopy 04/03/22.

## 2022-04-02 ENCOUNTER — Encounter: Payer: Self-pay | Admitting: Gastroenterology

## 2022-04-03 ENCOUNTER — Encounter: Payer: Self-pay | Admitting: Gastroenterology

## 2022-04-03 ENCOUNTER — Ambulatory Visit: Payer: Medicare PPO | Admitting: Anesthesiology

## 2022-04-03 ENCOUNTER — Encounter
Admission: RE | Disposition: A | Discharge status: Home / Self Care | Payer: Self-pay | Source: Ambulatory Visit | Attending: Gastroenterology

## 2022-04-03 ENCOUNTER — Ambulatory Visit
Admission: RE | Admit: 2022-04-03 | Discharge: 2022-04-03 | Disposition: A | Discharge status: Home / Self Care | Payer: Medicare PPO | Source: Ambulatory Visit | Attending: Gastroenterology | Admitting: Gastroenterology

## 2022-04-03 DIAGNOSIS — K52831 Collagenous colitis: Secondary | ICD-10-CM | POA: Insufficient documentation

## 2022-04-03 DIAGNOSIS — K259 Gastric ulcer, unspecified as acute or chronic, without hemorrhage or perforation: Secondary | ICD-10-CM | POA: Insufficient documentation

## 2022-04-03 DIAGNOSIS — Z980 Intestinal bypass and anastomosis status: Secondary | ICD-10-CM | POA: Diagnosis not present

## 2022-04-03 DIAGNOSIS — K2100 Gastro-esophageal reflux disease with esophagitis, without bleeding: Secondary | ICD-10-CM | POA: Diagnosis not present

## 2022-04-03 DIAGNOSIS — Z681 Body mass index (BMI) 19 or less, adult: Secondary | ICD-10-CM | POA: Diagnosis not present

## 2022-04-03 DIAGNOSIS — K641 Second degree hemorrhoids: Secondary | ICD-10-CM | POA: Diagnosis not present

## 2022-04-03 DIAGNOSIS — K5730 Diverticulosis of large intestine without perforation or abscess without bleeding: Secondary | ICD-10-CM | POA: Diagnosis not present

## 2022-04-03 DIAGNOSIS — R152 Fecal urgency: Secondary | ICD-10-CM | POA: Diagnosis not present

## 2022-04-03 DIAGNOSIS — F32A Depression, unspecified: Secondary | ICD-10-CM | POA: Insufficient documentation

## 2022-04-03 DIAGNOSIS — K2960 Other gastritis without bleeding: Secondary | ICD-10-CM | POA: Insufficient documentation

## 2022-04-03 DIAGNOSIS — Z8711 Personal history of peptic ulcer disease: Secondary | ICD-10-CM | POA: Insufficient documentation

## 2022-04-03 DIAGNOSIS — J45909 Unspecified asthma, uncomplicated: Secondary | ICD-10-CM | POA: Diagnosis not present

## 2022-04-03 DIAGNOSIS — K635 Polyp of colon: Secondary | ICD-10-CM | POA: Diagnosis not present

## 2022-04-03 DIAGNOSIS — R634 Abnormal weight loss: Secondary | ICD-10-CM | POA: Insufficient documentation

## 2022-04-03 DIAGNOSIS — R1310 Dysphagia, unspecified: Secondary | ICD-10-CM | POA: Insufficient documentation

## 2022-04-03 DIAGNOSIS — K5190 Ulcerative colitis, unspecified, without complications: Secondary | ICD-10-CM | POA: Insufficient documentation

## 2022-04-03 DIAGNOSIS — K6289 Other specified diseases of anus and rectum: Secondary | ICD-10-CM | POA: Insufficient documentation

## 2022-04-03 HISTORY — DX: Peptic ulcer, site unspecified, unspecified as acute or chronic, without hemorrhage or perforation: K27.9

## 2022-04-03 HISTORY — PX: COLONOSCOPY: SHX5424

## 2022-04-03 HISTORY — DX: Alcohol dependence, uncomplicated: F10.20

## 2022-04-03 HISTORY — PX: ESOPHAGOGASTRODUODENOSCOPY: SHX5428

## 2022-04-03 SURGERY — COLONOSCOPY
Anesthesia: General

## 2022-04-03 MED ORDER — SODIUM CHLORIDE 0.9 % IV SOLN
INTRAVENOUS | Status: DC
Start: 2022-04-03 — End: 2022-04-03

## 2022-04-03 MED ORDER — LIDOCAINE HCL (CARDIAC) PF 100 MG/5ML IV SOSY
PREFILLED_SYRINGE | INTRAVENOUS | Status: DC | PRN
  Administered 2022-04-03: 80 mg via INTRAVENOUS

## 2022-04-03 MED ORDER — PROPOFOL 10 MG/ML IV BOLUS
INTRAVENOUS | Status: DC | PRN
  Administered 2022-04-03 (×4): 10 mg via INTRAVENOUS
  Administered 2022-04-03: 40 mg via INTRAVENOUS

## 2022-04-03 MED ORDER — SPOT INK MARKER SYRINGE KIT
PACK | SUBMUCOSAL | Status: DC | PRN
  Administered 2022-04-03: 4 mL via SUBMUCOSAL

## 2022-04-03 MED ORDER — PROPOFOL 500 MG/50ML IV EMUL
INTRAVENOUS | Status: DC | PRN
  Administered 2022-04-03: 125 ug/kg/min via INTRAVENOUS

## 2022-04-03 NOTE — Transfer of Care (Signed)
Immediate Anesthesia Transfer of Care Note  Patient: Renee Cabrera  Procedure(s) Performed: COLONOSCOPY ESOPHAGOGASTRODUODENOSCOPY (EGD)  Patient Location: Endoscopy Unit  Anesthesia Type:General  Level of Consciousness: drowsy and patient cooperative  Airway & Oxygen Therapy: Patient Spontanous Breathing and Patient connected to face mask oxygen  Post-op Assessment: Report given to RN and Post -op Vital signs reviewed and stable  Post vital signs: Reviewed and stable  Last Vitals:  Vitals Value Taken Time  BP 129/71 04/03/22 1111  Temp 36.2 C 04/03/22 1110  Pulse 97 04/03/22 1112  Resp 20 04/03/22 1112  SpO2 100 % 04/03/22 1112  Vitals shown include unvalidated device data.  Last Pain:  Vitals:   04/03/22 1110  TempSrc: Temporal  PainSc: Asleep         Complications: No notable events documented.

## 2022-04-03 NOTE — Anesthesia Preprocedure Evaluation (Signed)
Anesthesia Evaluation  Patient identified by MRN, date of birth, ID band Patient awake    Reviewed: Allergy & Precautions, NPO status , Patient's Chart, lab work & pertinent test results  History of Anesthesia Complications Negative for: history of anesthetic complications  Airway Mallampati: II  TM Distance: >3 FB Neck ROM: Full    Dental  (+) Teeth Intact, Chipped   Pulmonary asthma , neg sleep apnea, neg COPD, Patient abstained from smoking.Not current smoker,  Mild asthma, takes inhalers every couple of months   Pulmonary exam normal breath sounds clear to auscultation       Cardiovascular Exercise Tolerance: Good METShypertension, Pt. 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[place])     Comment:  stopped 1994 No date: Anemia     Comment:  IRON INFUSIONS No date: Arthritis     Comment:  osteoarthritis/ RA No date: Asthma     Comment:  remote h/o asthma No date: Depression     Comment:  controlled No date: H/O hypoglycemia No date: H/O: GI bleed No date: Headache(784.0)     Comment:  h/o migraines No date: Hearing loss, bilateral     Comment:  wears hearing aides No date: History of blood transfusion     Comment:  pt states she had yellow jaundice No date: Hypertension     Comment:  H/O NO TX NOW 1995: Lupus (systemic lupus erythematosus) (HCC)     Comment:  in remission since 2005 No date: Nasal sinus congestion No date: Osteoporosis with fracture No date: Peptic ulcer No date: Stress fracture of foot     Comment:  left  foot No date: Vitamin B 12 deficiency  Reproductive/Obstetrics                             Anesthesia Physical Anesthesia Plan  ASA: 2  Anesthesia Plan: General   Post-op Pain Management: Minimal or no pain anticipated   Induction: Intravenous  PONV Risk Score and Plan: 3 and Propofol infusion, TIVA and Ondansetron  Airway Management Planned: Nasal Cannula  Additional Equipment: None  Intra-op Plan:   Post-operative Plan:   Informed Consent: I have reviewed the patients History and Physical, chart, labs and discussed the procedure including the risks, benefits and alternatives for the proposed anesthesia with the patient or authorized representative who has indicated his/her understanding and acceptance.     Dental advisory given  Plan Discussed with: CRNA and Surgeon  Anesthesia Plan Comments: (Discussed risks of anesthesia with patient, including possibility of difficulty with spontaneous ventilation under anesthesia necessitating airway intervention, PONV, post operative cognitive dysfunction, and rare risks such as cardiac or respiratory or neurological events, and allergic reactions. Discussed the role of CRNA in patient's perioperative care. Patient understands.)        Anesthesia Quick Evaluation

## 2022-04-03 NOTE — Anesthesia Postprocedure Evaluation (Signed)
Anesthesia Post Note  Patient: [name]  Procedure(s) Performed: COLONOSCOPY ESOPHAGOGASTRODUODENOSCOPY (EGD)  Patient location during evaluation: Endoscopy Anesthesia Type: General Level of consciousness: awake and alert Pain management: pain level controlled Vital Signs Assessment: post-procedure vital signs reviewed and stable Respiratory status: spontaneous breathing, nonlabored ventilation, respiratory function stable and patient connected to nasal cannula oxygen Cardiovascular status: blood pressure returned to baseline and stable Postop Assessment: no apparent nausea or vomiting Anesthetic complications: no   No notable events documented.   Last Vitals:  Vitals:   04/03/22 1130 04/03/22 1140  BP: (!) 147/101 (!) 159/94  Pulse: 98 97  Resp: 18 (!) 30  Temp:    SpO2: 95% 100%    Last Pain:  Vitals:   04/03/22 1140  TempSrc:   PainSc: 0-No pain                 Arita Miss

## 2022-04-03 NOTE — Anesthesia Procedure Notes (Signed)
Procedure Name: General with mask airway Date/Time: 04/03/2022 10:18 AM  Performed by: Kelton Pillar, CRNAPre-anesthesia Checklist: Patient identified, Emergency Drugs available, Suction available and Patient being monitored Patient Re-evaluated:Patient Re-evaluated prior to induction Oxygen Delivery Method: Simple face mask Induction Type: IV induction Placement Confirmation: positive ETCO2, CO2 detector and breath sounds checked- equal and bilateral Dental Injury: Teeth and Oropharynx as per pre-operative assessment

## 2022-04-03 NOTE — Op Note (Addendum)
[facility] Gastroenterology Patient Name: Renee Cabrera Procedure Date: 04/03/2022 9:58 AM MRN: [id number] Account #: [id number] Date of Birth: 1941-08-20 Admit Type: Outpatient Age: 80 Room: [facility] ENDO ROOM 1 Gender: Female Note Status: Supervisor Override Instrument Name: Altamese Cabal Endoscope [id number] Procedure:             Upper GI endoscopy Indications:           Dysphagia, Weight loss Providers:             Rueben Bash, DO Referring MD:          Irven Easterly. Kary Kos, MD (Referring MD) Medicines:             Monitored Anesthesia Care Complications:         No immediate complications. Estimated blood loss:                         Minimal. Procedure:             Pre-Anesthesia Assessment:                        - Prior to the procedure, a History and Physical was                         performed, and patient medications and allergies were                         reviewed. The patient is competent. The risks and                         benefits of the procedure and the sedation options and                         risks were discussed with the patient. All questions                         were answered and informed consent was obtained.                         Patient identification and proposed procedure were                         verified by the physician, the nurse, the anesthetist                         and the technician in the endoscopy suite. Mental                         Status Examination: alert and oriented. Airway                         Examination: normal oropharyngeal airway and neck                         mobility. Respiratory Examination: clear to                         auscultation. CV Examination: RRR, no murmurs, no S3  or S4. Prophylactic Antibiotics: The patient does not                         require prophylactic antibiotics. Prior                         Anticoagulants: The patient has taken no previous                          anticoagulant or antiplatelet agents. ASA Grade                         Assessment: II - A patient with mild systemic disease.                         After reviewing the risks and benefits, the patient                         was deemed in satisfactory condition to undergo the                         procedure. The anesthesia plan was to use monitored                         anesthesia care (MAC). Immediately prior to                         administration of medications, the patient was                         re-assessed for adequacy to receive sedatives. The                         heart rate, respiratory rate, oxygen saturations,                         blood pressure, adequacy of pulmonary ventilation, and                         response to care were monitored throughout the                         procedure. The physical status of the patient was                         re-assessed after the procedure.                        After obtaining informed consent, the endoscope was                         passed under direct vision. Throughout the procedure,                         the patient's blood pressure, pulse, and oxygen                         saturations were monitored continuously. The Endoscope  was introduced through the mouth, and advanced to the                         second part of duodenum. The upper GI endoscopy was                         accomplished without difficulty. The patient tolerated                         the procedure well. Findings:      Appearingly bilroth 1 anatomy      Duodenitis with erosion- biopsied with cold forceps. Estimated blood       loss: minimal. Otherwise, small bowel mucosa appeared normal.      Gastric-small bowel anastamosis with ulceration and some oozing      Gastritis noted- biopsies taken with cold forceps to rule out H Pylori.       Estimated blood loss: minimal. Multiple erosions  throughout the stomach.      GEJ 40 cm from incisors- narrowed. Dilated with 36 Fr Savary. No       resistance or change in mucosa noted upon relook.      Dilated with 13.03-11-15.5-18mm TTS balloon without change in mucosa.      Esophageal Spasm noted. Grade A esophagitis noted.      Esophagus otherwise normal, Estimated blood loss was minimal. Impression:            - Gastritis. Biopsied.                        - Ulceration at reanastamosis. Erosions in remnant                         gastric body.                        Esophagitis- Grade A Recommendation:        - Discharge patient to home.                        - Soft diet.                        - Continue present medications.                        - Will change proton pump inhibitor to capsule so can                         break open and administer twice daily for better                         healing.                        - No aspirin, ibuprofen, naproxen, or other                         non-steroidal anti-inflammatory drugs for 5 days.                        - Await pathology results.                        -  Return to GI clinic as previously scheduled.                        - The findings and recommendations were discussed with                         the patient. Procedure Code(s):     --- Professional ---                        [phone], Esophagogastroduodenoscopy, flexible,                         transoral; diagnostic, including collection of                         specimen(s) by brushing or washing, when performed                         (separate procedure) Diagnosis Code(s):     --- Professional ---                        K29.70, Gastritis, unspecified, without bleeding CPT copyright 2019 American Medical Association. All rights reserved. The codes documented in this report are preliminary and upon coder review may  be revised to meet current compliance requirements. Attending Participation:      I personally  performed the entire procedure. Volney American, DO [name] DO, DO 04/03/2022 11:19:20 AM This report has been signed electronically. Number of Addenda: 0 Note Initiated On: 04/03/2022 9:58 AM Estimated Blood Loss:  Estimated blood loss was minimal.      [facility]

## 2022-04-03 NOTE — Op Note (Signed)
[facility] Gastroenterology Patient Name: Renee Cabrera Procedure Date: 04/03/2022 9:57 AM MRN: [id number] Account #: [id number] Date of Birth: [birth year] Admit Type: Outpatient Age: 81 Room: [facility] ENDO ROOM 1 Gender: Female Note Status: Finalized Instrument Name: Peds Colonoscope [id number] Procedure:             Colonoscopy Indications:           Weight loss Providers:             Rueben Bash, DO Referring MD:          Irven Easterly. Kary Kos, MD (Referring MD) Medicines:             Monitored Anesthesia Care Complications:         No immediate complications. Estimated blood loss:                         Minimal. Procedure:             Pre-Anesthesia Assessment:                        - Prior to the procedure, a History and Physical was                         performed, and patient medications and allergies were                         reviewed. The patient is competent. The risks and                         benefits of the procedure and the sedation options and                         risks were discussed with the patient. All questions                         were answered and informed consent was obtained.                         Patient identification and proposed procedure were                         verified by the physician, the nurse, the anesthetist                         and the technician in the endoscopy suite. Mental                         Status Examination: alert and oriented. Airway                         Examination: normal oropharyngeal airway and neck                         mobility. Respiratory Examination: clear to                         auscultation. CV Examination: RRR, no murmurs, no S3  or S4. Prophylactic Antibiotics: The patient does not                         require prophylactic antibiotics. Prior                         Anticoagulants: The patient has taken no previous                          anticoagulant or antiplatelet agents. ASA Grade                         Assessment: II - A patient with mild systemic disease.                         After reviewing the risks and benefits, the patient                         was deemed in satisfactory condition to undergo the                         procedure. The anesthesia plan was to use general                         anesthesia. Immediately prior to administration of                         medications, the patient was re-assessed for adequacy                         to receive sedatives. The heart rate, respiratory                         rate, oxygen saturations, blood pressure, adequacy of                         pulmonary ventilation, and response to care were                         monitored throughout the procedure. The physical                         status of the patient was re-assessed after the                         procedure.                        After obtaining informed consent, the colonoscope was                         passed under direct vision. Throughout the procedure,                         the patient's blood pressure, pulse, and oxygen                         saturations were monitored continuously. The  Colonoscope was introduced through the anus and                         advanced to the the terminal ileum, with                         identification of the appendiceal orifice and IC                         valve. The colonoscopy was somewhat difficult. The                         patient tolerated the procedure well. The quality of                         the bowel preparation was evaluated using the BBPS                         [facility] Bowel Preparation Scale) with scores of: Right                         Colon = 3, Transverse Colon = 3 and Left Colon = 3                         (entire mucosa seen well with no residual staining,                         small fragments of stool  or opaque liquid). The total                         BBPS score equals 9. The terminal ileum, ileocecal                         valve, appendiceal orifice, and rectum were                         photographed. Findings:      Hemorrhoids were found on perianal exam.      The digital rectal exam was normal. Pertinent negatives include normal       sphincter tone.      The terminal ileum appeared normal. Estimated blood loss: none.      Scattered small-mouthed diverticula were found in the entire colon.       Estimated blood loss: none.      Normal mucosa was found in the entire colon. Biopsies for histology were       taken with a cold forceps from the right colon and left colon for       evaluation of microscopic colitis. To prevent bleeding after the biopsy,       one hemostatic clip was successfully placed (MR conditional). There was       no bleeding at the end of the procedure. Estimated blood loss was       minimal.      Non-bleeding internal hemorrhoids were found during retroflexion and       during perianal exam. The hemorrhoids were Grade II (internal       hemorrhoids that prolapse but reduce spontaneously). Estimated blood       loss:  none.      Anal papilla(e) were hypertrophied. Estimated blood loss: none.      A 18 to 22 mm polyp was found in the sigmoid colon (~40cm from anus).       The polyp was carpet-like. Initially attempted to lift with eleview-       however, due to positioning difficulties and patient coughing, could not       adequately or safely lift, so attempted was aborted. Area was tattooed       with an injection of 3 mL of [place] ink distal to . Estimated blood loss       was minimal. No biopsies taken.      The exam was otherwise without abnormality on direct and retroflexion       views. Impression:            - Hemorrhoids found on perianal exam.                        - The examined portion of the ileum was normal.                        -  Diverticulosis in the entire examined colon.                        - Normal mucosa in the entire examined colon.                         Biopsied. Clip (MR conditional) was placed.                        - Non-bleeding internal hemorrhoids.                        - Anal papilla(e) were hypertrophied.                        - One 18 to 22 mm polyp in the sigmoid colon. Tattooed.                        - The examination was otherwise normal on direct and                         retroflexion views. Recommendation:        - Discharge patient to home.                        - Resume previous diet.                        - Continue present medications.                        - No aspirin, ibuprofen, naproxen, or other                         non-steroidal anti-inflammatory drugs.                        - Await pathology results.                        -  Repeat colonoscopy at appointment to be scheduled.                        - Return to endoscopist at appointment to be                         scheduled. Will refer for advanced endoscopy for                         lesion removal                        - The findings and recommendations were discussed with                         the patient. Procedure Code(s):     --- Professional ---                        [phone], Colonoscopy, flexible; with biopsy, single or                         multiple                        45381, Colonoscopy, flexible; with directed submucosal                         injection(s), any substance Diagnosis Code(s):     --- Professional ---                        K64.1, Second degree hemorrhoids                        K62.89, Other specified diseases of anus and rectum                        K63.5, Polyp of colon                        R63.4, Abnormal weight loss                        K57.30, Diverticulosis of large intestine without                         perforation or abscess without bleeding CPT copyright 2019  American Medical Association. All rights reserved. The codes documented in this report are preliminary and upon coder review may  be revised to meet current compliance requirements. Attending Participation:      I personally performed the entire procedure. Volney American, DO [name] DO, DO 04/03/2022 11:26:33 AM This report has been signed electronically. Number of Addenda: 0 Note Initiated On: 04/03/2022 9:57 AM Scope Withdrawal Time: 0 hours 17 minutes 37 seconds  Total Procedure Duration: 0 hours 26 minutes 46 seconds  Estimated Blood Loss:  Estimated blood loss was minimal.      [facility]

## 2022-04-03 NOTE — Interval H&P Note (Signed)
History and Physical Interval Note: Preprocedure H&P from 04/03/22  was reviewed and there was no interval change after seeing and examining the patient.  Written consent was obtained from the patient after discussion of risks, benefits, and alternatives. Patient has consented to proceed with Esophagogastroduodenoscopy and Colonoscopy with possible intervention   04/03/2022 10:03 AM  Renee Cabrera  has presented today for surgery, with the diagnosis of Weight loss (R63.4) Poor appetite (R63.0) Dysphagia, unspecified type (R13.10).  The various methods of treatment have been discussed with the patient and family. After consideration of risks, benefits and other options for treatment, the patient has consented to  Procedure(s): COLONOSCOPY (N/A) ESOPHAGOGASTRODUODENOSCOPY (EGD) (N/A) as a surgical intervention.  The patient's history has been reviewed, patient examined, no change in status, stable for surgery.  I have reviewed the patient's chart and labs.  Questions were answered to the patient's satisfaction.     [name]

## 2022-04-03 NOTE — H&P (Signed)
Pre-Procedure H&P   Patient ID: Renee Cabrera is a 81 y.o. female.  Gastroenterology Provider: [name], DO  Referring Provider: Laurine Blazer, PA PCP: [name], MD  Date: 04/03/2022  HPI Renee Cabrera is a 81 y.o. female who presents today for Esophagogastroduodenoscopy and Colonoscopy for Abnormal weight loss, dysphagia. Patient experienced pneumonia where she lost 10 to 15 pounds but then gained it back.  However, from November to March she lost 10 pounds again with notable decreased appetite.  She was started on Megace and mirtazapine with some improvement.  She does note solid food dysphagia specifically to meats breads and pastas which causes her to regurgitate.  She feels the sticks in the mid to upper esophagus.  She also has dysphagia to pills but no issues with liquids.  She denies any odynophagia.  Her acid reflux is well controlled on PPI.  She denies any nausea or vomiting,postprandial pain, sitophobia.  She is status [place] I at age 21 due to history of gastric ulcer and has undergone 2 revisions back in 1963 in 1973. She is 2-3 bowel movements a day where she notes urgency in the morning.  No melena or hematochezia.  Underwent EGD and colonoscopy in 2014 which were normal aside from diverticulosis.  Video capsule at that time was also negative.  CT scan in 2022 was unremarkable.  Most recent lab work hemoglobin 13.2 MCV 95.5 platelets 518,000 iron 91 iron saturation 33% ferritin 116 TSH within normal limits.  Creatinine 1.2  Past Medical History:  Diagnosis Date   Alcoholism ([place])    stopped 1994   Anemia    IRON INFUSIONS   Arthritis    osteoarthritis/ RA   Asthma    remote h/o asthma   Depression    controlled   H/O hypoglycemia    H/O: GI bleed    Headache(784.0)    h/o migraines   Hearing loss, bilateral    wears hearing aides   History of blood transfusion    pt states she had yellow jaundice   Hypertension    H/O NO TX  NOW   Lupus (systemic lupus erythematosus) ([place]) 1995   in remission since 2005   Nasal sinus congestion    Osteoporosis with fracture    Peptic ulcer    Stress fracture of foot    left foot   Vitamin B 12 deficiency     Past Surgical History:  Procedure Laterality Date   ANTERIOR CERVICAL DECOMP/DISCECTOMY FUSION  2009   BACK SURGERY     CATARACT EXTRACTION W/PHACO Left 09/30/2016   Procedure: CATARACT EXTRACTION PHACO AND INTRAOCULAR LENS PLACEMENT ([place]);  Surgeon: Birder Robson, MD;  Location: [facility] ORS;  Service: Ophthalmology;  Laterality: Left;  Lot# [id number] H [place]: 00:43.6 AP%: 19.6 CDE: 8.55   CATARACT EXTRACTION W/PHACO Right 01/11/2019   Procedure: CATARACT EXTRACTION PHACO AND INTRAOCULAR LENS PLACEMENT ([place]) RIGHT;  Surgeon: Birder Robson, MD;  Location: [facility];  Service: Ophthalmology;  Laterality: Right;  [place] 01:03.9 CDE 9.19 Fluid pack Lot # [id number]   INTRAMEDULLARY (IM) NAIL INTERTROCHANTERIC Right 07/01/2016   Procedure: INTRAMEDULLARY (IM) NAIL EXCHANGE OF FEMORAL ROD;  Surgeon: Hessie Knows, MD;  Location: [facility] ORS;  Service: Orthopedics;  Laterality: Right;   ORIF FEMUR FRACTURE  2012   PARTIAL GASTRECTOMY  1963   and reconstruction [place]    Family History No h/o GI disease or malignancy  Review of Systems  Constitutional:  Positive for  appetite change and unexpected weight change. Negative for activity change, chills, diaphoresis, fatigue and fever.  HENT:  Positive for trouble swallowing. Negative for voice change.   Respiratory:  Negative for shortness of breath and wheezing.   Cardiovascular:  Negative for chest pain, palpitations and leg swelling.  Gastrointestinal:  Negative for abdominal distention, abdominal pain, anal bleeding, blood in stool, constipation, diarrhea, nausea, rectal pain and vomiting.  Musculoskeletal:  Negative for arthralgias and myalgias.  Skin:  Negative for color change and pallor.  Neurological:   Negative for dizziness, syncope and weakness.  Psychiatric/Behavioral:  Negative for confusion.   All other systems reviewed and are negative.    Medications No current facility-administered medications on file prior to encounter.   Current Outpatient Medications on File Prior to Encounter  Medication Sig Dispense Refill   albuterol (VENTOLIN HFA) 108 (90 Base) MCG/ACT inhaler Inhale into the lungs every 6 (six) hours as needed for wheezing or shortness of breath.     Calcium Carbonate-Vitamin D 600-400 MG-UNIT tablet Take by mouth.     cholecalciferol (VITAMIN D) 1000 UNITS tablet Take 1,000 Units by mouth every evening.      citalopram (CELEXA) 20 MG tablet Take 20 mg by mouth daily at 8 pm. (1900)     gabapentin (NEURONTIN) 100 MG capsule Take 100-200 mg by mouth See admin instructions. Take 1 capsule (100 mg) by mouth in the morning & take 2 capsules (200 mg) by mouth at night.     HYDROcodone-acetaminophen (NORCO/VICODIN) 5-325 MG tablet Take 1 tablet by mouth every 4 (four) hours as needed (pain.).     megestrol (MEGACE) 40 MG/ML suspension Take 800 mg by mouth daily.     meloxicam (MOBIC) 7.5 MG tablet Take 1 tablet by mouth daily.     mirtazapine (REMERON) 30 MG tablet Take 30 mg by mouth at bedtime.     pantoprazole (PROTONIX) 40 MG tablet Take 1 tablet by mouth daily.     Suvorexant (BELSOMRA) 10 MG TABS Take 1 tablet by mouth daily.     venlafaxine XR (EFFEXOR-XR) 37.5 MG 24 hr capsule Take by mouth.     ascorbic Acid (VITAMIN C) 500 MG CPCR Take by mouth.     aspirin EC 81 MG tablet Take 81 mg by mouth daily.     BELSOMRA 10 MG TABS Take 10 mg by mouth at bedtime.   5   Calcium Carb-Cholecalciferol (CALTRATE 600+D3 PO) Take 1 tablet by mouth 2 (two) times daily. (Patient not taking: Reported on 09/13/2021)     citalopram (CELEXA) 20 MG tablet Take 1 tablet by mouth daily. (Patient not taking: Reported on 09/13/2021)     clobetasol (TEMOVATE) 0.05 % external solution MIX IN A  TUB OF CERAVE CREAM AND USE EVERYDAY UP TO TWICE DAILY ON ITCHY RASH UNTIL CLEAR, AVOID [place], [place], [place]. 50 mL 0   cyanocobalamin (,VITAMIN B-12,) 1000 MCG/ML injection Inject 1,000 mcg into the muscle every 30 (thirty) days.      denosumab (PROLIA) 60 MG/ML SOSY injection Inject 60 mg into the skin every 6 (six) months.      Fe Fum-FA-B Cmp-C-Zn-Mg-Mn-Cu (HEMATINIC PLUS VIT/MINERALS) 106-1 MG TABS Take 1 tablet by mouth daily with lunch.   3   Fe Fum-FA-B Cmp-C-Zn-Mg-Mn-Cu (HEMATINIC PLUS VIT/MINERALS) 106-1 MG TABS Take 1 tablet by mouth daily. (Patient not taking: Reported on 09/13/2021)     ketoconazole (NIZORAL) 2 % shampoo APPLY 1 APPLICATION TOPICALLY DAILY. MASSAGE INTO SCALP AND LEAVE IN FOR  10 MINUTES BEFORE RINSING OUT 120 mL 4   megestrol (MEGACE) 40 MG/ML suspension Take by mouth. (Patient not taking: Reported on 09/13/2021)     meloxicam (MOBIC) 7.5 MG tablet Take 7.5 mg by mouth daily with supper.  (Patient not taking: Reported on 09/13/2021)     mirtazapine (REMERON) 45 MG tablet Take 1 tablet by mouth at bedtime. (Patient not taking: Reported on 09/13/2021)     mirtazapine (REMERON) 45 MG tablet  (Patient not taking: Reported on 09/13/2021)     Multiple Vitamins-Minerals (PRESERVISION AREDS) TABS Take 1 tablet by mouth 2 (two) times daily.      pantoprazole (PROTONIX) 40 MG tablet Take 40 mg by mouth daily before supper.   1   Travoprost, BAK Free, (TRAVATAN) 0.004 % SOLN ophthalmic solution SMARTSIG:1 Drop(s) In Eye(s) Every Evening     travoprost, benzalkonium, (TRAVATAN) 0.004 % ophthalmic solution Place 1 drop into both eyes at bedtime.     vitamin C (ASCORBIC ACID) 500 MG tablet Take 500 mg by mouth 2 (two) times daily.      Pertinent medications related to GI and procedure were reviewed by me with the patient prior to the procedure   Current Facility-Administered Medications:    0.9 %  sodium chloride infusion, , Intravenous, Continuous, [name], DO,  Last Rate: 20 mL/hr at 04/03/22 0931, Restarted at 04/03/22 0712      Allergies  Allergen Reactions   Sulfa Antibiotics Rash   Allergies were reviewed by me prior to the procedure  Objective   Body mass index is 15.62 kg/m. Vitals:   04/03/22 0908  BP: (!) 142/98  Pulse: (!) 104  Resp: 20  Temp: (!) 97.2 F (36.2 C)  TempSrc: Temporal  Weight: 36.3 kg  Height: 5' (1.524 m)     Physical Exam Vitals and nursing note reviewed.  Constitutional:      General: She is not in acute distress.    Appearance: She is not ill-appearing, toxic-appearing or diaphoretic.     Comments: Thin appearing  HENT:     Head: Normocephalic and atraumatic.     Nose: Nose normal.     Mouth/Throat:     Mouth: Mucous membranes are moist.     Pharynx: Oropharynx is clear.  Eyes:     General: No scleral icterus.    Extraocular Movements: Extraocular movements intact.  Cardiovascular:     Rate and Rhythm: Regular rhythm. Tachycardia present.     Heart sounds: Normal heart sounds. No murmur heard.    No friction rub. No gallop.  Pulmonary:     Effort: Pulmonary effort is normal. No respiratory distress.     Breath sounds: Normal breath sounds. No wheezing, rhonchi or rales.  Abdominal:     General: Abdomen is flat. Bowel sounds are normal. There is no distension.     Palpations: Abdomen is soft.     Tenderness: There is no abdominal tenderness. There is no guarding or rebound.  Musculoskeletal:     Cervical back: Neck supple.     Right lower leg: No edema.     Left lower leg: No edema.  Skin:    General: Skin is warm and dry.     Coloration: Skin is not jaundiced or pale.  Neurological:     General: No focal deficit present.     Mental Status: She is alert and oriented to person, place, and time. Mental status is at baseline.  Psychiatric:  Mood and Affect: Mood normal.        Behavior: Behavior normal.        Thought Content: Thought content normal.        Judgment: Judgment  normal.      Assessment:  Renee Cabrera is a 81 y.o. female  who presents today for Esophagogastroduodenoscopy and Colonoscopy for Abnormal weight loss, dysphagia.  Plan:  Esophagogastroduodenoscopy and Colonoscopy with possible intervention today  Esophagogastroduodenoscopy and Colonoscopy with possible biopsy, control of bleeding, polypectomy, and interventions as necessary has been discussed with the patient/patient representative. Informed consent was obtained from the patient/patient representative after explaining the indication, nature, and risks of the procedure including but not limited to death, bleeding, perforation, missed neoplasm/lesions, cardiorespiratory compromise, and reaction to medications. Opportunity for questions was given and appropriate answers were provided. Patient/patient representative has verbalized understanding is amenable to undergoing the procedure.   [name], DO  [facility]  Portions of the record may have been created with voice recognition software. Occasional wrong-word or 'sound-a-like' substitutions may have occurred due to the inherent limitations of voice recognition software.  Read the chart carefully and recognize, using context, where substitutions may have occurred.

## 2022-04-04 ENCOUNTER — Encounter: Payer: Self-pay | Admitting: Gastroenterology

## 2022-04-04 LAB — SURGICAL PATHOLOGY

## 2022-04-09 ENCOUNTER — Encounter: Payer: Self-pay | Admitting: *Deleted

## 2022-04-21 ENCOUNTER — Encounter: Payer: Self-pay | Admitting: Internal Medicine

## 2022-04-23 ENCOUNTER — Ambulatory Visit: Payer: Medicare PPO | Admitting: Urology

## 2022-04-23 ENCOUNTER — Encounter: Payer: Self-pay | Admitting: Internal Medicine

## 2022-04-23 ENCOUNTER — Ambulatory Visit
Admission: RE | Admit: 2022-04-23 | Discharge: 2022-04-23 | Disposition: A | Discharge status: Home / Self Care | Payer: Medicare PPO | Attending: Urology | Admitting: Urology

## 2022-04-23 ENCOUNTER — Encounter: Payer: Self-pay | Admitting: Urology

## 2022-04-23 ENCOUNTER — Ambulatory Visit
Admission: RE | Admit: 2022-04-23 | Discharge: 2022-04-23 | Disposition: A | Discharge status: Home / Self Care | Payer: Medicare PPO | Source: Ambulatory Visit | Attending: Urology | Admitting: Urology

## 2022-04-23 VITALS — BP 91/57 | HR 98 | Ht 59.0 in | Wt 80.0 lb

## 2022-04-23 DIAGNOSIS — N200 Calculus of kidney: Secondary | ICD-10-CM

## 2022-04-23 LAB — MICROSCOPIC EXAMINATION: RBC, Urine: >30 /hpf — AB (ref 0–2)

## 2022-04-23 LAB — URINALYSIS, COMPLETE
Bilirubin, UA: NEGATIVE
Glucose, UA: NEGATIVE
Ketones, UA: NEGATIVE
Leukocytes,UA: NEGATIVE
Nitrite, UA: NEGATIVE
Specific Gravity, UA: 1.025 (ref 1.005–1.030)
Urobilinogen, Ur: 0.2 mg/dL (ref 0.2–1.0)
pH, UA: 5.5 (ref 5.0–7.5)

## 2022-04-23 NOTE — Progress Notes (Signed)
04/23/2022 5:52 PM   Renee Cabrera [birth year] [id number]  Referring provider: Ottie Glazier, MD [place],  [place] [address]  Chief Complaint  Patient presents with   Nephrolithiasis    HPI: Renee Cabrera is a 81 y.o. female referred for evaluation of nephrolithiasis.  History of lung mass and PET scan performed 03/25/2020 for weight loss evaluation Incidentally noted to have bilateral, nonobstructing renal calculi Denies flank/abdominal pain, voiding symptoms or gross hematuria Denies prior history of stone disease or treatment CT scan December 2020 did show bilateral, nonobstructing renal calculi   PMH: Past Medical History:  Diagnosis Date   Alcoholism ([place])    stopped 1994   Anemia    IRON INFUSIONS   Arthritis    osteoarthritis/ RA   Asthma    remote h/o asthma   Depression    controlled   H/O hypoglycemia    H/O: GI bleed    Headache(784.0)    h/o migraines   Hearing loss, bilateral    wears hearing aides   History of blood transfusion    pt states she had yellow jaundice   Hypertension    H/O NO TX NOW   Lupus (systemic lupus erythematosus) ([place]) 1995   in remission since 2005   Nasal sinus congestion    Osteoporosis with fracture    Peptic ulcer    Stress fracture of foot    left foot   Vitamin B 12 deficiency     Surgical History: Past Surgical History:  Procedure Laterality Date   ANTERIOR CERVICAL DECOMP/DISCECTOMY FUSION  2009   BACK SURGERY     CATARACT EXTRACTION W/PHACO Left 09/30/2016   Procedure: CATARACT EXTRACTION PHACO AND INTRAOCULAR LENS PLACEMENT ([place]);  Surgeon: Birder Robson, MD;  Location: [facility];  Service: Ophthalmology;  Laterality: Left;  Lot# [id number] H [place]: 00:43.6 AP%: 19.6 CDE: 8.55   CATARACT EXTRACTION W/PHACO Right 01/11/2019   Procedure: CATARACT EXTRACTION PHACO AND INTRAOCULAR LENS PLACEMENT ([place]) RIGHT;  Surgeon: Birder Robson, MD;  Location: [facility];  Service: Ophthalmology;   Laterality: Right;  [place] 01:03.9 CDE 9.19 Fluid pack Lot # [id number]   COLONOSCOPY N/A 04/03/2022   Procedure: COLONOSCOPY;  Surgeon: [name], DO;  Location: [facility] ENDOSCOPY;  Service: Gastroenterology;  Laterality: N/A;   ESOPHAGOGASTRODUODENOSCOPY N/A 04/03/2022   Procedure: ESOPHAGOGASTRODUODENOSCOPY (EGD);  Surgeon: [name], DO;  Location: [facility] ENDOSCOPY;  Service: Gastroenterology;  Laterality: N/A;   INTRAMEDULLARY (IM) NAIL INTERTROCHANTERIC Right 07/01/2016   Procedure: INTRAMEDULLARY (IM) NAIL EXCHANGE OF FEMORAL ROD;  Surgeon: Hessie Knows, MD;  Location: [facility] ORS;  Service: Orthopedics;  Laterality: Right;   ORIF FEMUR FRACTURE  2012   PARTIAL GASTRECTOMY  1963   and reconstruction 1973   TONSILLECTOMY  1945    Home Medications:  Allergies as of 04/23/2022       Reactions   Sulfa Antibiotics Rash        Medication List        Accurate as of April 23, 2022  5:52 PM. If you have any questions, ask your nurse or doctor.          albuterol 108 (90 Base) MCG/ACT inhaler Commonly known as: VENTOLIN HFA Inhale into the lungs every 6 (six) hours as needed for wheezing or shortness of breath.   aspirin EC 81 MG tablet Take 81 mg by mouth daily.   Belsomra 10 MG Tabs Generic drug: Suvorexant Take 10 mg by mouth at bedtime.   Belsomra 10  MG Tabs Generic drug: Suvorexant Take 1 tablet by mouth daily.   Calcium Carbonate-Vitamin D 600-400 MG-UNIT tablet Take by mouth.   CALTRATE 600+D3 PO Take 1 tablet by mouth 2 (two) times daily.   cholecalciferol 1000 units tablet Commonly known as: VITAMIN D Take 1,000 Units by mouth every evening.   citalopram 20 MG tablet Commonly known as: CELEXA Take 20 mg by mouth daily at 8 pm. (1900)   citalopram 20 MG tablet Commonly known as: CELEXA Take 1 tablet by mouth daily.   clobetasol 0.05 % external solution Commonly known as: TEMOVATE MIX IN A TUB OF CERAVE CREAM AND USE EVERYDAY UP TO TWICE  DAILY ON ITCHY RASH UNTIL CLEAR, AVOID FACE, GROIN, AXILLAA.   cyanocobalamin 1000 MCG/ML injection Commonly known as: (VITAMIN B-12) Inject 1,000 mcg into the muscle every 30 (thirty) days.   denosumab 60 MG/ML Sosy injection Commonly known as: PROLIA Inject 60 mg into the skin every 6 (six) months.   gabapentin 100 MG capsule Commonly known as: NEURONTIN Take 100-200 mg by mouth See admin instructions. Take 1 capsule (100 mg) by mouth in the morning & take 2 capsules (200 mg) by mouth at night.   Hematinic Plus Vit/Minerals 106-1 MG Tabs Take 1 tablet by mouth daily with lunch.   Hematinic Plus Vit/Minerals 106-1 MG Tabs Take 1 tablet by mouth daily.   HYDROcodone-acetaminophen 5-325 MG tablet Commonly known as: NORCO/VICODIN Take 1 tablet by mouth every 4 (four) hours as needed (pain.).   ketoconazole 2 % shampoo Commonly known as: NIZORAL APPLY 1 APPLICATION TOPICALLY DAILY. MASSAGE INTO SCALP AND LEAVE IN FOR 10 MINUTES BEFORE RINSING OUT   megestrol 40 MG/ML suspension Commonly known as: MEGACE Take 800 mg by mouth daily.   megestrol 40 MG/ML suspension Commonly known as: MEGACE Take by mouth.   meloxicam 7.5 MG tablet Commonly known as: MOBIC Take 7.5 mg by mouth daily with supper.   meloxicam 7.5 MG tablet Commonly known as: MOBIC Take 1 tablet by mouth daily.   mirtazapine 30 MG tablet Commonly known as: REMERON Take 30 mg by mouth at bedtime.   mirtazapine 45 MG tablet Commonly known as: REMERON Take 1 tablet by mouth at bedtime.   mirtazapine 45 MG tablet Commonly known as: REMERON   pantoprazole 40 MG tablet Commonly known as: PROTONIX Take 1 tablet by mouth daily. What changed: Another medication with the same name was removed. Continue taking this medication, and follow the directions you see here. Changed by: Abbie Sons, MD   PreserVision AREDS Tabs Take 1 tablet by mouth 2 (two) times daily.   travoprost (benzalkonium) 0.004 %  ophthalmic solution Commonly known as: TRAVATAN Place 1 drop into both eyes at bedtime.   Travoprost (BAK Free) 0.004 % Soln ophthalmic solution Commonly known as: TRAVATAN SMARTSIG:1 Drop(s) In Eye(s) Every Evening   venlafaxine XR 37.5 MG 24 hr capsule Commonly known as: EFFEXOR-XR Take by mouth.   vitamin C 500 MG tablet Commonly known as: ASCORBIC ACID Take 500 mg by mouth 2 (two) times daily.   ascorbic Acid 500 MG Cpcr Commonly known as: VITAMIN C Take by mouth.        Allergies:  Allergies  Allergen Reactions   Sulfa Antibiotics Rash    Family History: Family History  Problem Relation Age of Onset   Breast cancer Neg Hx     Social History:  reports that she has never smoked. She has never used smokeless tobacco. She reports that  she does not drink alcohol and does not use drugs.   Physical Exam: BP (!) 91/57   Pulse 98   Ht '4\' 11"'$  (1.499 m)   Wt 80 lb (36.3 kg)   BMI 16.16 kg/m   Constitutional:  Alert and oriented, No acute distress. HEENT: [place] AT Respiratory: Normal respiratory effort, no increased work of breathing. Psychiatric: Normal mood and affect.   Pertinent Imaging: CT portion of the PET scan along with the previous CT 10/12/2019 images were personally reviewed and interpreted.  Bilateral nonobstructing renal calculi are noted with a 10 mm calculus in the left renal pelvis with a smaller just proximal adjacent calculus.  Stone density up to 1200 HU.  The renal pelvic calculi were not present on CT 2020   Assessment & Plan:    1.  Bilateral nephrolithiasis Nonobstructing left renal pelvic calculi We discussed the likelihood of renal pelvic calculi eventually causing obstruction and that treatment is generally recommended The 10 mm calculus will be too large to pass We discussed that management is based on stone size, location, density, patient co-morbidities, and patient preference.  SWL has a lower stone free rate in a single procedure,  but also a lower complication rate compared to ureteroscopy and avoids a stent and associated stent related symptoms. Possible complications include renal hematoma, steinstrasse, and need for additional treatment. Stone density is >1000 which could impact the efficacy of SWL  Ureteroscopy with laser lithotripsy and stent placement has a higher stone free rate than SWL in a single procedure, however increased complication rate including possible infection, ureteral injury, bleeding, and stent related morbidity. Common stent related symptoms include dysuria, urgency/frequency, and flank pain. After a discussion of the risks and benefits of the above treatment options, the patient would like to proceed with surveillance.  She recently had colonoscopy with a polyp that was either partially or too large to remove and is scheduled for removal at [place] in late August 2023.  She wants to wait until after that procedure to address her renal calculi.  A KUB was ordered today and she will follow-up in September 2023 with a KUB Instructed call earlier for development of symptoms.  UA today showed microhematuria 3-10 RBC    Abbie Sons, MD  [facility] [address], [place], [place] [address] [phone]

## 2022-07-17 ENCOUNTER — Encounter: Payer: Self-pay | Admitting: Internal Medicine

## 2022-07-23 ENCOUNTER — Other Ambulatory Visit: Payer: Self-pay | Admitting: *Deleted

## 2022-07-23 DIAGNOSIS — N200 Calculus of kidney: Secondary | ICD-10-CM

## 2022-07-24 ENCOUNTER — Ambulatory Visit: Payer: Medicare PPO | Admitting: Urology

## 2022-08-13 ENCOUNTER — Ambulatory Visit: Payer: Medicare PPO | Admitting: Urology

## 2022-08-13 ENCOUNTER — Encounter: Payer: Self-pay | Admitting: Urology

## 2022-08-13 ENCOUNTER — Ambulatory Visit
Admission: RE | Admit: 2022-08-13 | Discharge: 2022-08-13 | Disposition: A | Discharge status: Home / Self Care | Payer: Medicare PPO | Source: Ambulatory Visit | Attending: Urology | Admitting: Urology

## 2022-08-13 ENCOUNTER — Ambulatory Visit
Admission: RE | Admit: 2022-08-13 | Discharge: 2022-08-13 | Disposition: A | Discharge status: Home / Self Care | Payer: Medicare PPO | Attending: Urology | Admitting: Urology

## 2022-08-13 VITALS — BP 90/59 | Ht 59.0 in | Wt 82.0 lb

## 2022-08-13 DIAGNOSIS — N200 Calculus of kidney: Secondary | ICD-10-CM

## 2022-08-13 NOTE — H&P (View-Only) (Signed)
08/13/2022 9:59 AM   [name] 1940/11/19 [id number]  Referring provider: [name], MD [address] [facility],  [place] [address]  Chief Complaint  Patient presents with   Nephrolithiasis    HPI: 81 y.o. female presents for follow-up of nephrolithiasis of nephrolithiasis.  Review to my previous note 04/23/2022 Since her last visit she remains asymptomatic and denies flank or abdominal pain. KUB performed today does show slight interval stone growth of her left-sided calculi.  Right calculi are stable   PMH: Past Medical History:  Diagnosis Date   Alcoholism ([place])    stopped 1994   Anemia    IRON INFUSIONS   Arthritis    osteoarthritis/ RA   Asthma    remote h/o asthma   Depression    controlled   H/O hypoglycemia    H/O: GI bleed    Headache(784.0)    h/o migraines   Hearing loss, bilateral    wears hearing aides   History of blood transfusion    pt states she had yellow jaundice   Hypertension    H/O NO TX NOW   Lupus (systemic lupus erythematosus) ([place]) 1995   in remission since 2005   Nasal sinus congestion    Osteoporosis with fracture    Peptic ulcer    Stress fracture of foot    left foot   Vitamin B 12 deficiency     Surgical History: Past Surgical History:  Procedure Laterality Date   ANTERIOR CERVICAL DECOMP/DISCECTOMY FUSION  2009   BACK SURGERY     CATARACT EXTRACTION W/PHACO Left 09/30/2016   Procedure: CATARACT EXTRACTION PHACO AND INTRAOCULAR LENS PLACEMENT ([place]);  Surgeon: Birder Robson, MD;  Location: [facility] ORS;  Service: Ophthalmology;  Laterality: Left;  Lot# [id number] H [place]: 00:43.6 AP%: 19.6 CDE: 8.55   CATARACT EXTRACTION W/PHACO Right 01/11/2019   Procedure: CATARACT EXTRACTION PHACO AND INTRAOCULAR LENS PLACEMENT ([place]) RIGHT;  Surgeon: Birder Robson, MD;  Location: [facility] ORS;  Service: Ophthalmology;  Laterality: Right;  [place] 01:03.9 CDE 9.19 Fluid pack Lot # [id number]   COLONOSCOPY N/A 04/03/2022    Procedure: COLONOSCOPY;  Surgeon: [name], DO;  Location: [facility] ENDOSCOPY;  Service: Gastroenterology;  Laterality: N/A;   ESOPHAGOGASTRODUODENOSCOPY N/A 04/03/2022   Procedure: ESOPHAGOGASTRODUODENOSCOPY (EGD);  Surgeon: [name], DO;  Location: [facility] ENDOSCOPY;  Service: Gastroenterology;  Laterality: N/A;   INTRAMEDULLARY (IM) NAIL INTERTROCHANTERIC Right 07/01/2016   Procedure: INTRAMEDULLARY (IM) NAIL EXCHANGE OF FEMORAL ROD;  Surgeon: Hessie Knows, MD;  Location: [facility] ORS;  Service: Orthopedics;  Laterality: Right;   ORIF FEMUR FRACTURE  2012   PARTIAL GASTRECTOMY  1963   and reconstruction 1973   TONSILLECTOMY  1945    Home Medications:  Allergies as of 08/13/2022       Reactions   Sulfa Antibiotics Rash        Medication List        Accurate as of August 13, 2022  9:59 AM. If you have any questions, ask your nurse or doctor.          albuterol 108 (90 Base) MCG/ACT inhaler Commonly known as: VENTOLIN HFA Inhale into the lungs every 6 (six) hours as needed for wheezing or shortness of breath.   ascorbic acid 500 MG tablet Commonly known as: VITAMIN C Take 500 mg by mouth 2 (two) times daily.   ascorbic Acid 500 MG Cpcr Commonly known as: VITAMIN C Take by mouth.   aspirin EC 81 MG tablet  Take 81 mg by mouth daily.   Belsomra 10 MG Tabs Generic drug: Suvorexant Take 10 mg by mouth at bedtime.   Belsomra 10 MG Tabs Generic drug: Suvorexant Take 1 tablet by mouth daily.   Calcium Carbonate-Vitamin D 600-400 MG-UNIT tablet Take by mouth.   CALTRATE 600+D3 PO Take 1 tablet by mouth 2 (two) times daily.   cholecalciferol 1000 units tablet Commonly known as: VITAMIN D Take 1,000 Units by mouth every evening.   citalopram 20 MG tablet Commonly known as: CELEXA Take 20 mg by mouth daily at 8 pm. (1900)   citalopram 20 MG tablet Commonly known as: CELEXA Take 1 tablet by mouth daily.   clobetasol 0.05 % external  solution Commonly known as: TEMOVATE MIX IN A TUB OF CERAVE CREAM AND USE EVERYDAY UP TO TWICE DAILY ON ITCHY RASH UNTIL CLEAR, AVOID FACE, GROIN, AXILLAA.   cyanocobalamin 1000 MCG/ML injection Commonly known as: VITAMIN B12 Inject 1,000 mcg into the muscle every 30 (thirty) days.   denosumab 60 MG/ML Sosy injection Commonly known as: PROLIA Inject 60 mg into the skin every 6 (six) months.   gabapentin 100 MG capsule Commonly known as: NEURONTIN Take 100-200 mg by mouth See admin instructions. Take 1 capsule (100 mg) by mouth in the morning & take 2 capsules (200 mg) by mouth at night.   Hematinic Plus Vit/Minerals 106-1 MG Tabs Take 1 tablet by mouth daily with lunch.   Hematinic Plus Vit/Minerals 106-1 MG Tabs Take 1 tablet by mouth daily.   HYDROcodone-acetaminophen 5-325 MG tablet Commonly known as: NORCO/VICODIN Take 1 tablet by mouth every 4 (four) hours as needed (pain.).   ketoconazole 2 % shampoo Commonly known as: NIZORAL APPLY 1 APPLICATION TOPICALLY DAILY. MASSAGE INTO SCALP AND LEAVE IN FOR 10 MINUTES BEFORE RINSING OUT   megestrol 40 MG/ML suspension Commonly known as: MEGACE Take 800 mg by mouth daily.   megestrol 40 MG/ML suspension Commonly known as: MEGACE Take by mouth.   meloxicam 7.5 MG tablet Commonly known as: MOBIC Take 7.5 mg by mouth daily with supper.   meloxicam 7.5 MG tablet Commonly known as: MOBIC Take 1 tablet by mouth daily.   mirtazapine 30 MG tablet Commonly known as: REMERON Take 30 mg by mouth at bedtime.   mirtazapine 45 MG tablet Commonly known as: REMERON Take 1 tablet by mouth at bedtime.   mirtazapine 45 MG tablet Commonly known as: REMERON   pantoprazole 40 MG tablet Commonly known as: PROTONIX Take 1 tablet by mouth daily.   PreserVision AREDS Tabs Take 1 tablet by mouth 2 (two) times daily.   travoprost (benzalkonium) 0.004 % ophthalmic solution Commonly known as: TRAVATAN Place 1 drop into both eyes at  bedtime.   Travoprost (BAK Free) 0.004 % Soln ophthalmic solution Commonly known as: TRAVATAN SMARTSIG:1 Drop(s) In Eye(s) Every Evening   venlafaxine XR 37.5 MG 24 hr capsule Commonly known as: EFFEXOR-XR Take by mouth.        Allergies:  Allergies  Allergen Reactions   Sulfa Antibiotics Rash    Family History: Family History  Problem Relation Age of Onset   Breast cancer Neg Hx     Social History:  reports that she has never smoked. She has never used smokeless tobacco. She reports that she does not drink alcohol and does not use drugs.   Physical Exam: BP (!) 90/59   Ht '4\' 11"'$  (1.499 m)   Wt 82 lb (37.2 kg)   BMI 16.56 kg/m  Constitutional:  Alert and oriented, No acute distress. HEENT: [place] AT Respiratory: Normal respiratory effort, no increased work of breathing. Psychiatric: Normal mood and affect.   Pertinent Imaging: Images of a KUB performed today were personally reviewed and interpreted   Assessment & Plan:    1.  Bilateral nephrolithiasis Nonobstructing left renal pelvic calculi We discussed the likelihood of renal pelvic calculi eventually causing obstruction and that treatment is generally recommended The 10 mm calculus will be too large to pass We discussed that management is based on stone size, location, density, patient co-morbidities, and patient preference.  SWL has a lower stone free rate in a single procedure, but also a lower complication rate compared to ureteroscopy and avoids a stent and associated stent related symptoms. Possible complications include renal hematoma, steinstrasse, and need for additional treatment. Stone density is >1000 which could impact the efficacy of SWL  Ureteroscopy with laser lithotripsy and stent placement has a higher stone free rate than SWL in a single procedure, however increased complication rate including possible infection, ureteral injury, bleeding, and stent related morbidity. Common stent related  symptoms include dysuria, urgency/frequency, and flank pain. After a discussion of the risks and benefits of the above treatment options she has elected to schedule ureteroscopy    Abbie Sons, MD  [facility] [address] [place], [place] [address] [phone]

## 2022-08-13 NOTE — Progress Notes (Unsigned)
08/13/2022 9:59 AM   Renee Cabrera Jun 20, 1941 [id number]  Referring provider: Jerl Mina, MD [address] [facility] [place],  [place] [address]  Chief Complaint  Patient presents with   Nephrolithiasis    HPI: 81 y.o. female presents for follow-up of nephrolithiasis of nephrolithiasis.  Review to my previous note 04/23/2022 Since her last visit she remains asymptomatic and denies flank or abdominal pain. KUB performed today does show slight interval stone growth of her left-sided calculi.  Right calculi are stable   PMH: Past Medical History:  Diagnosis Date   Alcoholism ([facility])    stopped 1994   Anemia    IRON INFUSIONS   Arthritis    osteoarthritis/ RA   Asthma    remote h/o asthma   Depression    controlled   H/O hypoglycemia    H/O: GI bleed    Headache(784.0)    h/o migraines   Hearing loss, bilateral    wears hearing aides   History of blood transfusion    pt states she had yellow jaundice   Hypertension    H/O NO TX NOW   Lupus (systemic lupus erythematosus) ([facility]) 1995   in remission since 2005   Nasal sinus congestion    Osteoporosis with fracture    Peptic ulcer    Stress fracture of foot    left foot   Vitamin B 12 deficiency     Surgical History: Past Surgical History:  Procedure Laterality Date   ANTERIOR CERVICAL DECOMP/DISCECTOMY FUSION  2009   BACK SURGERY     CATARACT EXTRACTION W/PHACO Left 09/30/2016   Procedure: CATARACT EXTRACTION PHACO AND INTRAOCULAR LENS PLACEMENT ([facility]);  Surgeon: Galen Manila, MD;  Location: [facility] ORS;  Service: Ophthalmology;  Laterality: Left;  Lot# [id number] H [place]: 00:43.6 AP%: 19.6 CDE: 8.55   CATARACT EXTRACTION W/PHACO Right 01/11/2019   Procedure: CATARACT EXTRACTION PHACO AND INTRAOCULAR LENS PLACEMENT ([facility]) RIGHT;  Surgeon: Galen Manila, MD;  Location: [facility] ORS;  Service: Ophthalmology;  Laterality: Right;  [place] 01:03.9 CDE 9.19 Fluid pack Lot # [id number]   COLONOSCOPY N/A 04/03/2022    Procedure: COLONOSCOPY;  Surgeon: Jaynie Collins, DO;  Location: [facility] ENDOSCOPY;  Service: Gastroenterology;  Laterality: N/A;   ESOPHAGOGASTRODUODENOSCOPY N/A 04/03/2022   Procedure: ESOPHAGOGASTRODUODENOSCOPY (EGD);  Surgeon: Jaynie Collins, DO;  Location: [facility] ENDOSCOPY;  Service: Gastroenterology;  Laterality: N/A;   INTRAMEDULLARY (IM) NAIL INTERTROCHANTERIC Right 07/01/2016   Procedure: INTRAMEDULLARY (IM) NAIL EXCHANGE OF FEMORAL ROD;  Surgeon: Kennedy Bucker, MD;  Location: [facility] ORS;  Service: Orthopedics;  Laterality: Right;   ORIF FEMUR FRACTURE  2012   PARTIAL GASTRECTOMY  1963   and reconstruction 1973   TONSILLECTOMY  1945    Home Medications:  Allergies as of 08/13/2022       Reactions   Sulfa Antibiotics Rash        Medication List        Accurate as of August 13, 2022  9:59 AM. If you have any questions, ask your nurse or doctor.          albuterol 108 (90 Base) MCG/ACT inhaler Commonly known as: VENTOLIN HFA Inhale into the lungs every 6 (six) hours as needed for wheezing or shortness of breath.   ascorbic acid 500 MG tablet Commonly known as: VITAMIN C Take 500 mg by mouth 2 (two) times daily.   ascorbic Acid 500 MG Cpcr Commonly known as: VITAMIN C Take by mouth.   aspirin EC 81 MG tablet  Take 81 mg by mouth daily.   Belsomra 10 MG Tabs Generic drug: Suvorexant Take 10 mg by mouth at bedtime.   Belsomra 10 MG Tabs Generic drug: Suvorexant Take 1 tablet by mouth daily.   Calcium Carbonate-Vitamin D 600-400 MG-UNIT tablet Take by mouth.   CALTRATE 600+D3 PO Take 1 tablet by mouth 2 (two) times daily.   cholecalciferol 1000 units tablet Commonly known as: VITAMIN D Take 1,000 Units by mouth every evening.   citalopram 20 MG tablet Commonly known as: CELEXA Take 20 mg by mouth daily at 8 pm. (1900)   citalopram 20 MG tablet Commonly known as: CELEXA Take 1 tablet by mouth daily.   clobetasol 0.05 % external  solution Commonly known as: TEMOVATE MIX IN A TUB OF CERAVE CREAM AND USE EVERYDAY UP TO TWICE DAILY ON ITCHY RASH UNTIL CLEAR, AVOID FACE, GROIN, AXILLAA.   cyanocobalamin 1000 MCG/ML injection Commonly known as: VITAMIN B12 Inject 1,000 mcg into the muscle every 30 (thirty) days.   denosumab 60 MG/ML Sosy injection Commonly known as: PROLIA Inject 60 mg into the skin every 6 (six) months.   gabapentin 100 MG capsule Commonly known as: NEURONTIN Take 100-200 mg by mouth See admin instructions. Take 1 capsule (100 mg) by mouth in the morning & take 2 capsules (200 mg) by mouth at night.   Hematinic Plus Vit/Minerals 106-1 MG Tabs Take 1 tablet by mouth daily with lunch.   Hematinic Plus Vit/Minerals 106-1 MG Tabs Take 1 tablet by mouth daily.   HYDROcodone-acetaminophen 5-325 MG tablet Commonly known as: NORCO/VICODIN Take 1 tablet by mouth every 4 (four) hours as needed (pain.).   ketoconazole 2 % shampoo Commonly known as: NIZORAL APPLY 1 APPLICATION TOPICALLY DAILY. MASSAGE INTO SCALP AND LEAVE IN FOR 10 MINUTES BEFORE RINSING OUT   megestrol 40 MG/ML suspension Commonly known as: MEGACE Take 800 mg by mouth daily.   megestrol 40 MG/ML suspension Commonly known as: MEGACE Take by mouth.   meloxicam 7.5 MG tablet Commonly known as: MOBIC Take 7.5 mg by mouth daily with supper.   meloxicam 7.5 MG tablet Commonly known as: MOBIC Take 1 tablet by mouth daily.   mirtazapine 30 MG tablet Commonly known as: REMERON Take 30 mg by mouth at bedtime.   mirtazapine 45 MG tablet Commonly known as: REMERON Take 1 tablet by mouth at bedtime.   mirtazapine 45 MG tablet Commonly known as: REMERON   pantoprazole 40 MG tablet Commonly known as: PROTONIX Take 1 tablet by mouth daily.   PreserVision AREDS Tabs Take 1 tablet by mouth 2 (two) times daily.   travoprost (benzalkonium) 0.004 % ophthalmic solution Commonly known as: TRAVATAN Place 1 drop into both eyes at  bedtime.   Travoprost (BAK Free) 0.004 % Soln ophthalmic solution Commonly known as: TRAVATAN SMARTSIG:1 Drop(s) In Eye(s) Every Evening   venlafaxine XR 37.5 MG 24 hr capsule Commonly known as: EFFEXOR-XR Take by mouth.        Allergies:  Allergies  Allergen Reactions   Sulfa Antibiotics Rash    Family History: Family History  Problem Relation Age of Onset   Breast cancer Neg Hx     Social History:  reports that she has never smoked. She has never used smokeless tobacco. She reports that she does not drink alcohol and does not use drugs.   Physical Exam: BP (!) 90/59   Ht 4\' 11"  (1.499 m)   Wt 82 lb (37.2 kg)   BMI 16.56 kg/m  Constitutional:  Alert and oriented, No acute distress. HEENT: [place] AT Respiratory: Normal respiratory effort, no increased work of breathing. Psychiatric: Normal mood and affect.   Pertinent Imaging: Images of a KUB performed today were personally reviewed and interpreted   Assessment & Plan:    1.  Bilateral nephrolithiasis Nonobstructing left renal pelvic calculi We discussed the likelihood of renal pelvic calculi eventually causing obstruction and that treatment is generally recommended The 10 mm calculus will be too large to pass We discussed that management is based on stone size, location, density, patient co-morbidities, and patient preference.  SWL has a lower stone free rate in a single procedure, but also a lower complication rate compared to ureteroscopy and avoids a stent and associated stent related symptoms. Possible complications include renal hematoma, steinstrasse, and need for additional treatment. Stone density is >1000 which could impact the efficacy of SWL  Ureteroscopy with laser lithotripsy and stent placement has a higher stone free rate than SWL in a single procedure, however increased complication rate including possible infection, ureteral injury, bleeding, and stent related morbidity. Common stent related  symptoms include dysuria, urgency/frequency, and flank pain. After a discussion of the risks and benefits of the above treatment options she has elected to schedule ureteroscopy  ***Melissa orders    Riki Altes, MD  [facility] [address], [address] [place], [place] [address] [phone]

## 2022-08-14 ENCOUNTER — Other Ambulatory Visit: Payer: Self-pay | Admitting: Urology

## 2022-08-14 ENCOUNTER — Other Ambulatory Visit: Payer: Self-pay | Admitting: Family Medicine

## 2022-08-14 ENCOUNTER — Encounter: Payer: Self-pay | Admitting: Urology

## 2022-08-14 DIAGNOSIS — N200 Calculus of kidney: Secondary | ICD-10-CM

## 2022-08-14 NOTE — Progress Notes (Signed)
Surgical Physician Order Form [facility] [place]  * Scheduling expectation :  Patient preference  *Length of Case: 90 minutes  *Clearance needed: Per anesthesia  *Anticoagulation Instructions: N/A  *Aspirin Instructions: Hold Aspirin  *Post-op visit Date/Instructions:  1 week cysto stent removal  *Diagnosis: Left Nephrolithiasis  *Procedure: Left Ureteroscopy w/laser lithotripsy & stent placement (36725)   Additional orders: N/A  -Admit type: OUTpatient  -Anesthesia: General  -VTE Prophylaxis Standing Order SCD's       Other:   -Standing Lab Orders Per Anesthesia    Lab other: UA&Urine Culture  -Standing Test orders EKG/Chest x-ray per Anesthesia       Test other:   - Medications:  Ceftriaxone(Rocephin) 1gm IV  -Other orders:  N/A

## 2022-08-15 ENCOUNTER — Telehealth: Payer: Self-pay

## 2022-08-15 NOTE — Progress Notes (Signed)
[facility] Posting Form   Surgery Date/Time: Date: 09/02/2022  Surgeon: Dr. John Giovanni, MD  Surgery Location: Day Surgery  Inpt ( No  )   Outpt (Yes)   Obs ( No  )   Diagnosis: N20.0 Left Nephrolithiasis  -CPT: 38184  Surgery: Left Ureteroscopy with laser lithotripsy and stent placement   Stop Anticoagulations: No, will hold ASA  Cardiac/Medical/Pulmonary Clearance needed: no  *Orders entered into EPIC  Date: 08/15/22   *Case booked in [place]  Date: 08/14/2022  *Notified pt of Surgery: Date: 08/14/2022  PRE-OP UA & CX: yes, will obtain in clinic on 08/20/2022  *Placed into Prior Authorization Work Fabio Bering Date: 08/15/22   Assistant/laser/rep:No

## 2022-08-15 NOTE — Telephone Encounter (Signed)
I spoke with Renee Cabrera. We have discussed possible surgery dates and Tuesday November 7th, 2023 was agreed upon by all parties. Patient given information about surgery date, what to expect pre-operatively and post operatively.  We discussed that a Pre-Admission Testing office will be calling to set up the pre-op visit that will take place prior to surgery, and that these appointments are typically done over the phone with a Pre-Admissions RN. Informed patient that our office will communicate any additional care to be provided after surgery. Patients questions or concerns were discussed during our call. Advised to call our office should there be any additional information, questions or concerns that arise. Patient verbalized understanding.

## 2022-08-20 ENCOUNTER — Other Ambulatory Visit: Payer: Medicare PPO

## 2022-08-20 DIAGNOSIS — N200 Calculus of kidney: Secondary | ICD-10-CM

## 2022-08-20 LAB — URINALYSIS, COMPLETE
Bilirubin, UA: NEGATIVE
Glucose, UA: NEGATIVE
Ketones, UA: NEGATIVE
Leukocytes,UA: NEGATIVE
Nitrite, UA: NEGATIVE
Protein,UA: NEGATIVE
RBC, UA: NEGATIVE
Specific Gravity, UA: 1.025 (ref 1.005–1.030)
Urobilinogen, Ur: 0.2 mg/dL (ref 0.2–1.0)
pH, UA: 5 (ref 5.0–7.5)

## 2022-08-20 LAB — MICROSCOPIC EXAMINATION

## 2022-08-22 ENCOUNTER — Other Ambulatory Visit: Payer: Self-pay

## 2022-08-22 ENCOUNTER — Encounter
Admission: RE | Admit: 2022-08-22 | Discharge: 2022-08-22 | Disposition: A | Discharge status: Home / Self Care | Payer: Medicare PPO | Source: Ambulatory Visit | Attending: Urology | Admitting: Urology

## 2022-08-22 DIAGNOSIS — Z01812 Encounter for preprocedural laboratory examination: Secondary | ICD-10-CM

## 2022-08-22 HISTORY — DX: Personal history of urinary calculi: Z87.442

## 2022-08-22 HISTORY — DX: Gastro-esophageal reflux disease without esophagitis: K21.9

## 2022-08-22 HISTORY — DX: Pneumonia, unspecified organism: J18.9

## 2022-08-22 HISTORY — DX: Dyspnea, unspecified: R06.00

## 2022-08-22 LAB — CULTURE, URINE COMPREHENSIVE

## 2022-08-22 NOTE — Patient Instructions (Addendum)
Your procedure is scheduled on: 09/02/22 - Tuesday Report to the Registration Desk on the 1st floor of the [facility]. To find out your arrival time, please call [phone] between 1PM - 3PM on: 09/01/22 - Monday If your arrival time is 6:00 am, do not arrive prior to that time as the [facility] entrance doors do not open until 6:00 am.  REMEMBER: Instructions that are not followed completely may result in serious medical risk, up to and including death; or upon the discretion of your surgeon and anesthesiologist your surgery may need to be rescheduled.  Do not eat food or drink any fluids after midnight the night before surgery.  No gum chewing, lozengers or hard candies.  TAKE THESE MEDICATIONS THE MORNING OF SURGERY WITH A SIP OF WATER:  - albuterol (VENTOLIN) and bring it to the hospital with you. - gabapentin (NEURONTIN) - Nexium - venlafaxine XR (EFFEXOR-XR)  Stop taking beginning 08/26/22 - One week prior to surgery: meloxicam (MOBIC)  Stop Anti-inflammatories (NSAIDS) such as Advil, Aleve, Ibuprofen, Motrin, Naproxen, Naprosyn and Aspirin based products such as Excedrin, Goodys Powder, BC Powder.  Stop beginning 08/26/22 ANY OVER THE COUNTER supplements until after surgery.  You may however, continue to take Tylenol if needed for pain up until the day of surgery.  No Alcohol for 24 hours before or after surgery.  No Smoking including e-cigarettes for 24 hours prior to surgery.  No chewable tobacco products for at least 6 hours prior to surgery.  No nicotine patches on the day of surgery.  Do not use any "recreational" drugs for at least a week prior to your surgery.  Please be advised that the combination of cocaine and anesthesia may have negative outcomes, up to and including death. If you test positive for cocaine, your surgery will be cancelled.  On the morning of surgery brush your teeth with toothpaste and water, you may rinse your mouth with mouthwash if you  wish. Do not swallow any toothpaste or mouthwash.  Do not wear jewelry, make-up, hairpins, clips or nail polish.  Do not wear lotions, powders, or perfumes.   Do not shave body from the neck down 48 hours prior to surgery just in case you cut yourself which could leave a site for infection.  Also, freshly shaved skin may become irritated if using the CHG soap.  Contact lenses, hearing aids and dentures may not be worn into surgery.  Do not bring valuables to the hospital. [facility] is not responsible for any missing/lost belongings or valuables.   Notify your doctor if there is any change in your medical condition (cold, fever, infection).  Wear comfortable clothing (specific to your surgery type) to the hospital.  After surgery, you can help prevent lung complications by doing breathing exercises.  Take deep breaths and cough every 1-2 hours. Your doctor may order a device called an Incentive Spirometer to help you take deep breaths. When coughing or sneezing, hold a pillow firmly against your incision with both hands. This is called "splinting." Doing this helps protect your incision. It also decreases belly discomfort.  If you are being admitted to the hospital overnight, leave your suitcase in the car. After surgery it may be brought to your room.  If you are being discharged the day of surgery, you will not be allowed to drive home. You will need a responsible adult (18 years or older) to drive you home and stay with you that night.   If you are  taking public transportation, you will need to have a responsible adult (18 years or older) with you. Please confirm with your physician that it is acceptable to use public transportation.   Please call the [facility] Dept. at [phone] if you have any questions about these instructions.  Surgery Visitation Policy:  Patients undergoing a surgery or procedure may have two family members or support persons with them as  long as the person is not COVID-19 positive or experiencing its symptoms.   Inpatient Visitation:    Visiting hours are 7 a.m. to 8 p.m. Up to four visitors are allowed at one time in a patient room, including children. The visitors may rotate out with other people during the day. One designated support person (adult) may remain overnight.

## 2022-08-25 ENCOUNTER — Telehealth: Payer: Self-pay

## 2022-08-25 MED ORDER — AMOXICILLIN 875 MG PO TABS: 875.0000 mg | ORAL_TABLET | Freq: Two times a day (BID) | ORAL | 0 refills | Status: DC

## 2022-08-25 NOTE — Telephone Encounter (Signed)
Spoke with Renee Cabrera. Renee Cabrera. Advised of results and verbalized understanding. I have sent in medication to [name] per patient request.

## 2022-08-25 NOTE — Telephone Encounter (Signed)
-----   Message from Abbie Sons, MD sent at 08/24/2022  1:04 PM EDT ----- Preop urine culture positive for E. coli.  Please send in Rx amoxicillin 875 mg twice daily x7 days

## 2022-08-27 ENCOUNTER — Encounter: Payer: Self-pay | Admitting: Urgent Care

## 2022-08-27 ENCOUNTER — Encounter
Admission: RE | Admit: 2022-08-27 | Discharge: 2022-08-27 | Disposition: A | Discharge status: Home / Self Care | Payer: Medicare PPO | Source: Ambulatory Visit | Attending: Urology | Admitting: Urology

## 2022-08-27 DIAGNOSIS — Z01812 Encounter for preprocedural laboratory examination: Secondary | ICD-10-CM

## 2022-08-27 DIAGNOSIS — Z01810 Encounter for preprocedural cardiovascular examination: Secondary | ICD-10-CM | POA: Diagnosis present

## 2022-09-01 MED ORDER — ORAL CARE MOUTH RINSE: 15.0000 mL | Freq: Once | OROMUCOSAL | Status: AC

## 2022-09-01 MED ORDER — SODIUM CHLORIDE 0.9 % IV SOLN
1.0000 g | INTRAVENOUS | Status: AC
  Administered 2022-09-02: 1 g via INTRAVENOUS
  Filled 2022-09-01: qty 1

## 2022-09-01 MED ORDER — LACTATED RINGERS IV SOLN: INTRAVENOUS | Status: DC

## 2022-09-01 MED ORDER — CHLORHEXIDINE GLUCONATE 0.12 % MT SOLN: 15.0000 mL | Freq: Once | OROMUCOSAL | Status: AC

## 2022-09-02 ENCOUNTER — Ambulatory Visit: Payer: Medicare PPO | Admitting: Anesthesiology

## 2022-09-02 ENCOUNTER — Encounter: Payer: Self-pay | Admitting: Urology

## 2022-09-02 ENCOUNTER — Encounter
Admission: RE | Disposition: A | Discharge status: Home / Self Care | Payer: Self-pay | Source: Home / Self Care | Attending: Urology

## 2022-09-02 ENCOUNTER — Ambulatory Visit
Admission: RE | Admit: 2022-09-02 | Discharge: 2022-09-02 | Disposition: A | Discharge status: Home / Self Care | Payer: Medicare PPO | Attending: Urology | Admitting: Urology

## 2022-09-02 ENCOUNTER — Ambulatory Visit: Payer: Medicare PPO

## 2022-09-02 ENCOUNTER — Other Ambulatory Visit: Payer: Self-pay

## 2022-09-02 DIAGNOSIS — M069 Rheumatoid arthritis, unspecified: Secondary | ICD-10-CM | POA: Insufficient documentation

## 2022-09-02 DIAGNOSIS — M1990 Unspecified osteoarthritis, unspecified site: Secondary | ICD-10-CM | POA: Insufficient documentation

## 2022-09-02 DIAGNOSIS — K219 Gastro-esophageal reflux disease without esophagitis: Secondary | ICD-10-CM | POA: Diagnosis not present

## 2022-09-02 DIAGNOSIS — H9193 Unspecified hearing loss, bilateral: Secondary | ICD-10-CM | POA: Insufficient documentation

## 2022-09-02 DIAGNOSIS — Z79899 Other long term (current) drug therapy: Secondary | ICD-10-CM | POA: Diagnosis not present

## 2022-09-02 DIAGNOSIS — D649 Anemia, unspecified: Secondary | ICD-10-CM | POA: Diagnosis not present

## 2022-09-02 DIAGNOSIS — F32A Depression, unspecified: Secondary | ICD-10-CM | POA: Insufficient documentation

## 2022-09-02 DIAGNOSIS — N200 Calculus of kidney: Secondary | ICD-10-CM | POA: Diagnosis present

## 2022-09-02 DIAGNOSIS — M329 Systemic lupus erythematosus, unspecified: Secondary | ICD-10-CM | POA: Diagnosis not present

## 2022-09-02 DIAGNOSIS — M810 Age-related osteoporosis without current pathological fracture: Secondary | ICD-10-CM | POA: Diagnosis not present

## 2022-09-02 DIAGNOSIS — J45909 Unspecified asthma, uncomplicated: Secondary | ICD-10-CM | POA: Diagnosis not present

## 2022-09-02 HISTORY — PX: CYSTOSCOPY W/ RETROGRADES: SHX1426

## 2022-09-02 HISTORY — PX: CYSTOSCOPY/URETEROSCOPY/HOLMIUM LASER/STENT PLACEMENT: SHX6546

## 2022-09-02 SURGERY — CYSTOSCOPY/URETEROSCOPY/HOLMIUM LASER/STENT PLACEMENT
Anesthesia: General | Laterality: Left

## 2022-09-02 MED ORDER — PROPOFOL 10 MG/ML IV BOLUS
INTRAVENOUS | Status: DC | PRN
  Administered 2022-09-02: 50 mg via INTRAVENOUS

## 2022-09-02 MED ORDER — FENTANYL CITRATE (PF) 100 MCG/2ML IJ SOLN
INTRAMUSCULAR | Status: AC
  Filled 2022-09-02: qty 2

## 2022-09-02 MED ORDER — IOHEXOL 180 MG/ML  SOLN
INTRAMUSCULAR | Status: DC | PRN
  Administered 2022-09-02: 10 mL

## 2022-09-02 MED ORDER — CHLORHEXIDINE GLUCONATE 0.12 % MT SOLN
OROMUCOSAL | Status: AC
  Administered 2022-09-02: 15 mL via OROMUCOSAL
  Filled 2022-09-02: qty 15

## 2022-09-02 MED ORDER — DEXAMETHASONE SODIUM PHOSPHATE 10 MG/ML IJ SOLN
INTRAMUSCULAR | Status: AC
  Filled 2022-09-02: qty 1

## 2022-09-02 MED ORDER — ONDANSETRON HCL 4 MG/2ML IJ SOLN
INTRAMUSCULAR | Status: DC | PRN
  Administered 2022-09-02: 4 mg via INTRAVENOUS

## 2022-09-02 MED ORDER — LIDOCAINE HCL (CARDIAC) PF 100 MG/5ML IV SOSY
PREFILLED_SYRINGE | INTRAVENOUS | Status: DC | PRN
  Administered 2022-09-02: 60 mg via INTRAVENOUS

## 2022-09-02 MED ORDER — ACETAMINOPHEN 10 MG/ML IV SOLN
INTRAVENOUS | Status: AC
  Filled 2022-09-02: qty 100

## 2022-09-02 MED ORDER — TROSPIUM CHLORIDE 20 MG PO TABS: 20.0000 mg | ORAL_TABLET | Freq: Two times a day (BID) | ORAL | 0 refills | Status: DC | PRN

## 2022-09-02 MED ORDER — SUGAMMADEX SODIUM 200 MG/2ML IV SOLN
INTRAVENOUS | Status: DC | PRN
  Administered 2022-09-02: 200 mg via INTRAVENOUS

## 2022-09-02 MED ORDER — DEXAMETHASONE SODIUM PHOSPHATE 10 MG/ML IJ SOLN
INTRAMUSCULAR | Status: DC | PRN
  Administered 2022-09-02: 5 mg via INTRAVENOUS

## 2022-09-02 MED ORDER — FENTANYL CITRATE (PF) 100 MCG/2ML IJ SOLN
25.0000 ug | INTRAMUSCULAR | Status: DC | PRN
  Administered 2022-09-02 (×3): 25 ug via INTRAVENOUS

## 2022-09-02 MED ORDER — ONDANSETRON HCL 4 MG/2ML IJ SOLN: 4.0000 mg | Freq: Once | INTRAMUSCULAR | Status: DC | PRN

## 2022-09-02 MED ORDER — PHENYLEPHRINE HCL (PRESSORS) 10 MG/ML IV SOLN
INTRAVENOUS | Status: DC | PRN
  Administered 2022-09-02: 160 ug via INTRAVENOUS
  Administered 2022-09-02: 80 ug via INTRAVENOUS

## 2022-09-02 MED ORDER — ROCURONIUM BROMIDE 100 MG/10ML IV SOLN
INTRAVENOUS | Status: DC | PRN
  Administered 2022-09-02: 30 mg via INTRAVENOUS

## 2022-09-02 MED ORDER — LIDOCAINE HCL (PF) 2 % IJ SOLN
INTRAMUSCULAR | Status: AC
  Filled 2022-09-02: qty 5

## 2022-09-02 MED ORDER — SODIUM CHLORIDE 0.9 % IR SOLN
Status: DC | PRN
  Administered 2022-09-02: 2000 mL

## 2022-09-02 MED ORDER — ROCURONIUM BROMIDE 10 MG/ML (PF) SYRINGE
PREFILLED_SYRINGE | INTRAVENOUS | Status: AC
  Filled 2022-09-02: qty 10

## 2022-09-02 MED ORDER — FENTANYL CITRATE (PF) 100 MCG/2ML IJ SOLN
INTRAMUSCULAR | Status: DC | PRN
  Administered 2022-09-02 (×2): 50 ug via INTRAVENOUS

## 2022-09-02 MED ORDER — PROPOFOL 10 MG/ML IV BOLUS
INTRAVENOUS | Status: AC
  Filled 2022-09-02: qty 20

## 2022-09-02 MED ORDER — ACETAMINOPHEN 10 MG/ML IV SOLN
INTRAVENOUS | Status: DC | PRN
  Administered 2022-09-02: 750 mg via INTRAVENOUS

## 2022-09-02 MED ORDER — ONDANSETRON HCL 4 MG/2ML IJ SOLN
INTRAMUSCULAR | Status: AC
  Filled 2022-09-02: qty 2

## 2022-09-02 MED ORDER — FENTANYL CITRATE (PF) 100 MCG/2ML IJ SOLN
INTRAMUSCULAR | Status: AC
  Administered 2022-09-02: 25 ug via INTRAVENOUS
  Filled 2022-09-02: qty 2

## 2022-09-02 SURGICAL SUPPLY — 22 items
BAG DRAIN SIEMENS DORNER NS (MISCELLANEOUS) ×2 IMPLANT
BAG DRN NS LF (MISCELLANEOUS) ×2
BRUSH SCRUB EZ 1% IODOPHOR (MISCELLANEOUS) ×2 IMPLANT
CATH URET FLEX-TIP 2 LUMEN 10F (CATHETERS) IMPLANT
DRAPE UTILITY 15X26 TOWEL STRL (DRAPES) ×2 IMPLANT
FIBER LASER MOSES 200 DFL (Laser) IMPLANT
GLOVE SURG UNDER POLY LF SZ7.5 (GLOVE) ×2 IMPLANT
GOWN STRL REUS W/ TWL LRG LVL3 (GOWN DISPOSABLE) ×2 IMPLANT
GOWN STRL REUS W/ TWL XL LVL3 (GOWN DISPOSABLE) ×2 IMPLANT
GOWN STRL REUS W/TWL LRG LVL3 (GOWN DISPOSABLE) ×2
GOWN STRL REUS W/TWL XL LVL3 (GOWN DISPOSABLE) ×2
GUIDEWIRE GREEN .038 145CM (MISCELLANEOUS) IMPLANT
GUIDEWIRE STR DUAL SENSOR (WIRE) ×2 IMPLANT
IV NS IRRIG 3000ML ARTHROMATIC (IV SOLUTION) ×2 IMPLANT
KIT TURNOVER CYSTO (KITS) ×2 IMPLANT
PACK CYSTO AR (MISCELLANEOUS) ×2 IMPLANT
SET CYSTO W/LG BORE CLAMP LF (SET/KITS/TRAYS/PACK) ×2 IMPLANT
STENT URET 6FRX22 CONTOUR (STENTS) IMPLANT
SURGILUBE 2OZ TUBE FLIPTOP (MISCELLANEOUS) ×2 IMPLANT
TRAP FLUID SMOKE EVACUATOR (MISCELLANEOUS) ×2 IMPLANT
VALVE UROSEAL ADJ ENDO (VALVE) IMPLANT
WATER STERILE IRR 500ML POUR (IV SOLUTION) ×2 IMPLANT

## 2022-09-02 NOTE — Anesthesia Preprocedure Evaluation (Signed)
Anesthesia Evaluation  Patient identified by MRN, date of birth, ID band Patient awake    Reviewed: Allergy & Precautions, NPO status , Patient's Chart, lab work & pertinent test results  History of Anesthesia Complications Negative for: history of anesthetic complications  Airway Mallampati: II  TM Distance: >3 FB Neck ROM: Full    Dental  (+) Teeth Intact, Chipped   Pulmonary neg shortness of breath, asthma , neg sleep apnea, neg COPD, neg recent URI, Patient abstained from smoking.Not current smoker Mild asthma, takes inhalers every couple of months   Pulmonary exam normal breath sounds clear to auscultation       Cardiovascular Exercise Tolerance: Good METShypertension, Pt. 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 No date: Anemia     Comment:  IRON INFUSIONS No date: Arthritis     Comment:  osteoarthritis/ RA No date: Asthma     Comment:  remote h/o asthma No date: Depression     Comment:  controlled No date: H/O hypoglycemia No date: H/O: GI bleed No date: Headache(784.0)     Comment:  h/o migraines No date: Hearing loss, bilateral     Comment:  wears hearing aides No date: History of blood transfusion     Comment:  pt states she had yellow jaundice No date: Hypertension     Comment:  H/O NO TX NOW 1995: Lupus (systemic lupus erythematosus) (HCC)     Comment:  in remission since 2005 No date: Nasal sinus congestion No date: Osteoporosis with fracture No date:  Peptic ulcer No date: Stress fracture of foot     Comment:  left foot No date: Vitamin B 12 deficiency  Reproductive/Obstetrics                             Anesthesia Physical Anesthesia Plan  ASA: 2  Anesthesia Plan: General   Post-op Pain Management: Minimal or no pain anticipated   Induction: Intravenous  PONV Risk Score and Plan: 3 and Ondansetron, Dexamethasone and Treatment may vary due to age or medical condition  Airway Management Planned: LMA and Oral ETT  Additional Equipment: None  Intra-op Plan:   Post-operative Plan: Extubation in OR  Informed Consent: I have reviewed the patients History and Physical, chart, labs and discussed the procedure including the risks, benefits and alternatives for the proposed anesthesia with the patient or authorized representative who has indicated his/her understanding and acceptance.     Dental advisory given  Plan Discussed with: CRNA and Surgeon  Anesthesia Plan Comments: (Discussed risks of anesthesia with patient, including possibility of difficulty with spontaneous ventilation under anesthesia necessitating airway intervention, PONV, post operative cognitive dysfunction, and rare risks such as cardiac or respiratory or neurological events, and allergic reactions. Discussed the role of CRNA in patient's perioperative care. Patient understands.)        Anesthesia Quick Evaluation

## 2022-09-02 NOTE — Op Note (Signed)
Preoperative diagnosis: Left nephrolithiasis   Postoperative diagnosis: Left nephrolithiasis  Procedure:  Cystoscopy Left ureteroscopy and stone removal Ureteroscopic laser lithotripsy Left ureteral stent placement (69F/22 cm)  Left retrograde pyelography with interpretation Intraoperative fluoroscopy < 30 minutes  Surgeon: Nicki Reaper C. Jomarion Mish, M.D.  Anesthesia: General  Complications: None  Intraoperative findings:  Cystoscopy-bladder mucosa without erythema, solid or papillary lesions Ureteroscopy-normal appearing ureteral mucosa.  No lesions, calculi or stricture Pyeloscopy-renal pelvic and lower calyceal calculi identified Left retrograde pyelography demonstrated  filling defect defects within the renal pelvis and lower infundibula consistent with the patient's known calculi without other abnormalities.  Retrograde pyelogram post procedure showed no filling defects, stone fragments or contrast extravasation  EBL: Minimal  Specimens: None   Indication: Renee Cabrera is a 81 y.o. incidentally noted on PET/CT June 2023 to have bilateral, nonobstructing renal calculi.  She had a 10 mm renal pelvic calculus and an 8 mm infundibular calculus.  Based on stone size and location she elected ureteroscopic removal.  After reviewing the management options for treatment, the patient elected to proceed with the above surgical procedure(s). We have discussed the potential benefits and risks of the procedure, side effects of the proposed treatment, the likelihood of the patient achieving the goals of the procedure, and any potential problems that might occur during the procedure or recuperation. Informed consent has been obtained.  Description of procedure:  The patient was taken to the operating room and general anesthesia was induced.  The patient was placed in the dorsal lithotomy position, prepped and draped in the usual sterile fashion, and preoperative antibiotics were administered.  A preoperative time-out was performed.   The urethral meatus was small and A 70.5 French cystoscope sheath with obturator was lubricated and passed per.  A 30 degree lens was then placed into the sheath and panendoscopy was performed with findings as described above.  Attention was directed to the left ureteral orifice and a 0.038 Sensor wire was then advanced up the ureter into the renal pelvis under fluoroscopic guidance.  The cystoscope was removed and a dual-lumen catheter was placed over the Sensor wire.  Retrograde pyelogram was performed with findings as described above.  A Super Stiff wire was then placed and the dual-lumen catheter was removed.  An 8 [place] single channel digital flexible ureteroscope was advanced over the working wire and into the renal pelvis.  The working wire was removed and pyeloscopy was performed with findings as described above.  A 200 m Moses holmium laser fiber was placed through the ureteroscope and the stone was dusted at a setting of 0.3J/80 Hz.  The second calculus was dusted in a similar fashion  Fragments that had settled in lower calyces were further treated with noncontact laser lithotripsy at a setting of 0.6J/40 Hz.  Retrograde pyelogram was performed and each calyx was sequentially examined under fluoroscopic guidance.  No fragments identified larger than the typical laser fiber.  The ureteroscope was removed under direct vision and no ureteral mucosal abnormalities were identified.  A 69F/22 cm Contour ureteral stent was placed under fluoroscopic guidance.  The wire was then removed.  The tip of the stent was in an upper pole calyx approximately and there was good positioning distally  The bladder was then emptied and the procedure ended.  The patient appeared to tolerate the procedure well and without complications.  After anesthetic reversal the patient was transported to the PACU in stable condition.   Plan: Scheduled for cystoscopy with stent  removal 09/11/2022.  KUB prior to stent removal.   John Giovanni, MD

## 2022-09-02 NOTE — Anesthesia Procedure Notes (Signed)
Procedure Name: Intubation Date/Time: 09/02/2022 11:13 AM  Performed by: Natasha Mead, CRNAPre-anesthesia Checklist: Patient identified, Emergency Drugs available, Suction available and Patient being monitored Patient Re-evaluated:Patient Re-evaluated prior to induction Oxygen Delivery Method: Circle system utilized Preoxygenation: Pre-oxygenation with 100% oxygen Induction Type: IV induction Ventilation: Mask ventilation without difficulty Laryngoscope Size: Miller and 2 Grade View: Grade II Tube type: Oral Tube size: 6.0 mm Number of attempts: 1 Airway Equipment and Method: Stylet and Oral airway Placement Confirmation: ETT inserted through vocal cords under direct vision, positive ETCO2 and breath sounds checked- equal and bilateral Secured at: 19 cm Tube secured with: Tape Dental Injury: Teeth and Oropharynx as per pre-operative assessment

## 2022-09-02 NOTE — Transfer of Care (Signed)
Immediate Anesthesia Transfer of Care Note  Patient: Renee Cabrera  Procedure(s) Performed: CYSTOSCOPY/URETEROSCOPY/HOLMIUM LASER/STENT PLACEMENT (Left) CYSTOSCOPY WITH RETROGRADE PYELOGRAM  Patient Location: PACU  Anesthesia Type:General  Level of Consciousness: awake, alert , and oriented  Airway & Oxygen Therapy: Patient Spontanous Breathing and Patient connected to nasal cannula oxygen  Post-op Assessment: Report given to RN and Post -op Vital signs reviewed and stable  Post vital signs: Reviewed and stable  Last Vitals:  Vitals Value Taken Time  BP    Temp    Pulse    Resp    SpO2      Last Pain:  Vitals:   09/02/22 0729  TempSrc: Temporal  PainSc: 0-No pain         Complications: No notable events documented.

## 2022-09-02 NOTE — Discharge Instructions (Addendum)
AMBULATORY SURGERY  DISCHARGE INSTRUCTIONS   The drugs that you were given will stay in your system until tomorrow so for the next 24 hours you should not:  Drive an automobile Make any legal decisions Drink any alcoholic beverage   You may resume regular meals tomorrow.  Today it is better to start with liquids and gradually work up to solid foods.  You may eat anything you prefer, but it is better to start with liquids, then soup and crackers, and gradually work up to solid foods.   Please notify your doctor immediately if you have any unusual bleeding, trouble breathing, redness and pain at the surgery site, drainage, fever, or pain not relieved by medication.     Your post-operative visit with Dr.                                       is: Date:                        Time:    Please call to schedule your post-operative visit.  Additional Instructions:  DISCHARGE INSTRUCTIONS FOR KIDNEY STONE/URETERAL STENT   MEDICATIONS:  1. Resume all your other meds from home.  2.  AZO (over-the-counter) can help with the burning/stinging when you urinate. 3.  Trospium is for bladder/stent irritation, Rx was sent to your pharmacy.  ACTIVITY:  1. May resume regular activities in 24 hours. 2. No driving while on narcotic pain medications  3. Drink plenty of water  4. Continue to walk at home - you can still get blood clots when you are at home, so keep active, but don't over do it.  5. May return to work/school tomorrow or when you feel ready    SIGNS/SYMPTOMS TO CALL:  Common postoperative symptoms include urinary frequency, urgency, bladder spasm and blood in the urine  Please call us if you have a fever greater than 101.5, uncontrolled nausea/vomiting, uncontrolled pain, dizziness, unable to urinate, excessively bloody urine, chest pain, shortness of breath, leg swelling, leg pain, or any other concerns or questions.   You can reach [place] at [phone].   FOLLOW-UP:  1. You  have a follow-up appointment scheduled 09/11/2022 for stent removal

## 2022-09-02 NOTE — Interval H&P Note (Signed)
History and Physical Interval Note:  CV:RRR Lungs:clear  09/02/2022 10:53 AM  Renee Cabrera  has presented today for surgery, with the diagnosis of Left Nephrolithiasis.  The various methods of treatment have been discussed with the patient and family. After consideration of risks, benefits and other options for treatment, the patient has consented to  Procedure(s): CYSTOSCOPY/URETEROSCOPY/HOLMIUM LASER/STENT PLACEMENT (Left) as a surgical intervention.  The patient's history has been reviewed, patient examined, no change in status, stable for surgery.  I have reviewed the patient's chart and labs.  Questions were answered to the patient's satisfaction.     [place]

## 2022-09-03 ENCOUNTER — Other Ambulatory Visit: Payer: Self-pay | Admitting: *Deleted

## 2022-09-03 ENCOUNTER — Encounter: Payer: Self-pay | Admitting: Urology

## 2022-09-03 DIAGNOSIS — N200 Calculus of kidney: Secondary | ICD-10-CM

## 2022-09-09 ENCOUNTER — Other Ambulatory Visit: Payer: Self-pay | Admitting: Urology

## 2022-09-11 ENCOUNTER — Ambulatory Visit
Admission: RE | Admit: 2022-09-11 | Discharge: 2022-09-11 | Disposition: A | Discharge status: Home / Self Care | Payer: Medicare PPO | Source: Ambulatory Visit | Attending: Urology | Admitting: Urology

## 2022-09-11 ENCOUNTER — Encounter: Payer: Self-pay | Admitting: Urology

## 2022-09-11 ENCOUNTER — Ambulatory Visit: Payer: Medicare PPO | Admitting: Urology

## 2022-09-11 DIAGNOSIS — Z466 Encounter for fitting and adjustment of urinary device: Secondary | ICD-10-CM | POA: Diagnosis not present

## 2022-09-11 DIAGNOSIS — N200 Calculus of kidney: Secondary | ICD-10-CM

## 2022-09-11 LAB — MICROSCOPIC EXAMINATION
RBC, Urine: >30 /hpf — AB (ref 0–2)
WBC, UA: >30 /hpf — AB (ref 0–5)

## 2022-09-11 LAB — URINALYSIS, COMPLETE
Bilirubin, UA: NEGATIVE
Glucose, UA: NEGATIVE
Ketones, UA: NEGATIVE
Nitrite, UA: NEGATIVE
Specific Gravity, UA: 1.025 (ref 1.005–1.030)
Urobilinogen, Ur: 1 mg/dL (ref 0.2–1.0)
pH, UA: 6 (ref 5.0–7.5)

## 2022-09-11 MED ORDER — CIPROFLOXACIN HCL 500 MG PO TABS: 500.0000 mg | ORAL_TABLET | Freq: Once | ORAL | Status: DC

## 2022-09-11 MED ORDER — CIPROFLOXACIN HCL 500 MG PO TABS
500.0000 mg | ORAL_TABLET | Freq: Once | ORAL | Status: AC
  Administered 2022-09-11: 500 mg via ORAL

## 2022-09-11 NOTE — Progress Notes (Signed)
Indications: Patient is 81 y.o., who is s/p ureteroscopic removal of 8 and 10 mm left renal calculi 09/02/2022.  She had no postoperative complaints.  KUB performed today showed small residual renal calculi in the renal pelvis and lower calyces.  The patient is presenting today for stent removal.  Procedure:  Flexible Cystoscopy with stent removal (11216)  Timeout was performed and the correct patient, procedure and participants were identified.    Description:  The patient was prepped and draped in the usual sterile fashion. Flexible cystosopy was performed.  The stent was visualized, grasped, and removed intact without difficulty. The patient tolerated the procedure well.  A single dose of oral antibiotics was given.  Complications:  None  Plan:  Instructed to call for fever flank pain post stent removal Follow-up 3-4 months with KUB

## 2022-09-15 ENCOUNTER — Other Ambulatory Visit: Payer: Self-pay | Admitting: *Deleted

## 2022-09-15 ENCOUNTER — Inpatient Hospital Stay: Payer: Medicare PPO | Attending: Internal Medicine

## 2022-09-15 DIAGNOSIS — D509 Iron deficiency anemia, unspecified: Secondary | ICD-10-CM | POA: Insufficient documentation

## 2022-09-15 DIAGNOSIS — E538 Deficiency of other specified B group vitamins: Secondary | ICD-10-CM | POA: Diagnosis not present

## 2022-09-15 DIAGNOSIS — I129 Hypertensive chronic kidney disease with stage 1 through stage 4 chronic kidney disease, or unspecified chronic kidney disease: Secondary | ICD-10-CM | POA: Diagnosis not present

## 2022-09-15 DIAGNOSIS — N183 Chronic kidney disease, stage 3 unspecified: Secondary | ICD-10-CM | POA: Insufficient documentation

## 2022-09-15 DIAGNOSIS — D508 Other iron deficiency anemias: Secondary | ICD-10-CM

## 2022-09-15 LAB — BASIC METABOLIC PANEL
Anion gap: 6 (ref 5–15)
BUN: 28 mg/dL — ABNORMAL HIGH (ref 8–23)
CO2: 22 mmol/L (ref 22–32)
Calcium: 8.8 mg/dL — ABNORMAL LOW (ref 8.9–10.3)
Chloride: 110 mmol/L (ref 98–111)
Creatinine, Ser: 1.55 mg/dL — ABNORMAL HIGH (ref 0.44–1.00)
GFR, Estimated: 33 mL/min — ABNORMAL LOW (ref >60)
Glucose, Bld: 87 mg/dL (ref 70–99)
Potassium: 4.5 mmol/L (ref 3.5–5.1)
Sodium: 138 mmol/L (ref 135–145)

## 2022-09-15 LAB — CBC WITH DIFFERENTIAL/PLATELET
Abs Immature Granulocytes: 0.15 10*3/uL — ABNORMAL HIGH (ref 0.00–0.07)
Basophils Absolute: 0.1 10*3/uL (ref 0.0–0.1)
Basophils Relative: 1 %
Eosinophils Absolute: 0.4 10*3/uL (ref 0.0–0.5)
Eosinophils Relative: 4 %
HCT: 42.6 % (ref 36.0–46.0)
Hemoglobin: 13.5 g/dL (ref 12.0–15.0)
Immature Granulocytes: 2 %
Lymphocytes Relative: 21 %
Lymphs Abs: 2.1 10*3/uL (ref 0.7–4.0)
MCH: 31.5 pg (ref 26.0–34.0)
MCHC: 31.7 g/dL (ref 30.0–36.0)
MCV: 99.3 fL (ref 80.0–100.0)
Monocytes Absolute: 0.7 10*3/uL (ref 0.1–1.0)
Monocytes Relative: 8 %
Neutro Abs: 6.3 10*3/uL (ref 1.7–7.7)
Neutrophils Relative %: 64 %
Platelets: 458 10*3/uL — ABNORMAL HIGH (ref 150–400)
RBC: 4.29 MIL/uL (ref 3.87–5.11)
RDW: 13.3 % (ref 11.5–15.5)
WBC: 9.8 10*3/uL (ref 4.0–10.5)
nRBC: 0 % (ref 0.0–0.2)

## 2022-09-15 LAB — IRON AND TIBC
Iron: 64 ug/dL (ref 28–170)
Saturation Ratios: 21 % (ref 10.4–31.8)
TIBC: 308 ug/dL (ref 250–450)
UIBC: 244 ug/dL

## 2022-09-15 LAB — FERRITIN: Ferritin: 49 ng/mL (ref 11–307)

## 2022-09-15 LAB — C-REACTIVE PROTEIN: CRP: 0.7 mg/dL (ref <1.0)

## 2022-09-15 MED ORDER — AMOXICILLIN 875 MG PO TABS: 875.0000 mg | ORAL_TABLET | Freq: Two times a day (BID) | ORAL | 0 refills | Status: DC

## 2022-09-15 NOTE — Anesthesia Postprocedure Evaluation (Signed)
Anesthesia Post Note  Patient: Renee Cabrera  Procedure(s) Performed: CYSTOSCOPY/URETEROSCOPY/HOLMIUM LASER/STENT PLACEMENT (Left) CYSTOSCOPY WITH RETROGRADE PYELOGRAM  Patient location during evaluation: PACU Anesthesia Type: General Level of consciousness: awake and alert Pain management: pain level controlled Vital Signs Assessment: post-procedure vital signs reviewed and stable Respiratory status: spontaneous breathing, nonlabored ventilation, respiratory function stable and patient connected to nasal cannula oxygen Cardiovascular status: blood pressure returned to baseline and stable Postop Assessment: no apparent nausea or vomiting Anesthetic complications: no   No notable events documented.   Last Vitals:  Vitals:   09/02/22 1315 09/02/22 1333  BP: (!) 117/91 130/87  Pulse: 82 85  Resp: 20 20  Temp: (!) 36.1 C 36.9 C  SpO2: 95% 95%    Last Pain:  Vitals:   09/03/22 0848  TempSrc:   PainSc: 0-No pain                 Martha Clan

## 2022-09-15 NOTE — Telephone Encounter (Signed)
Urine culture from last week is growing bacteria.  Please send Rx amoxicillin 875 mg twice daily x7 days   Notified patient as instructed, patient pleased. Discussed follow-up appointments, patient agrees

## 2022-09-17 ENCOUNTER — Inpatient Hospital Stay (HOSPITAL_BASED_OUTPATIENT_CLINIC_OR_DEPARTMENT_OTHER): Payer: Medicare PPO | Admitting: Medical Oncology

## 2022-09-17 ENCOUNTER — Inpatient Hospital Stay: Payer: Medicare PPO

## 2022-09-17 ENCOUNTER — Encounter: Payer: Self-pay | Admitting: Medical Oncology

## 2022-09-17 VITALS — BP 123/80 | HR 77 | Temp 97.8°F | Resp 17

## 2022-09-17 VITALS — BP 105/73 | HR 96 | Temp 97.6°F | Resp 17 | Wt 81.4 lb

## 2022-09-17 DIAGNOSIS — K9589 Other complications of other bariatric procedure: Secondary | ICD-10-CM

## 2022-09-17 DIAGNOSIS — D508 Other iron deficiency anemias: Secondary | ICD-10-CM

## 2022-09-17 DIAGNOSIS — R7989 Other specified abnormal findings of blood chemistry: Secondary | ICD-10-CM

## 2022-09-17 DIAGNOSIS — D509 Iron deficiency anemia, unspecified: Secondary | ICD-10-CM | POA: Diagnosis not present

## 2022-09-17 DIAGNOSIS — R634 Abnormal weight loss: Secondary | ICD-10-CM

## 2022-09-17 MED ORDER — SODIUM CHLORIDE 0.9 % IV SOLN
200.0000 mg | Freq: Once | INTRAVENOUS | Status: AC
  Administered 2022-09-17: 200 mg via INTRAVENOUS
  Filled 2022-09-17: qty 200

## 2022-09-17 MED ORDER — SODIUM CHLORIDE 0.9 % IV SOLN
Freq: Once | INTRAVENOUS | Status: AC
  Filled 2022-09-17: qty 250

## 2022-09-17 NOTE — Progress Notes (Signed)
[place] OFFICE PROGRESS NOTE  Patient Care Team: [name], MD as PCP - General (Family Medicine) Cammie Sickle, MD as Consulting Physician (Hematology and Oncology)   Cancer Staging  No matching staging information was found for the patient.   Oncology History   No history exists.  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 Patient also intolerant of oral iron  -   received parenteral iron therapy with Feraheme 510 mg x 2 doses in May 2014.   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  Ambulating independently  Renee Cabrera 81 y.o.  female pleasant patient above history of Iron deficiency anemia status post partial gastrectomy is here for follow-up.  She reports that overall she is well but does feel like her ferritin may be a bit low as she doesn't have as much "pep in her step". No known bleeding episodes, stool changes, dark stools. Eating and drinking well. No other concerns.   Wt Readings from Last 3 Encounters:  09/17/22 81 lb 6.4 oz (36.9 kg)  09/11/22 82 lb (37.2 kg)  09/02/22 82 lb (37.2 kg)    Review of Systems  Constitutional:  Positive for malaise/fatigue and weight loss. Negative for chills, diaphoresis and fever.  HENT:  Negative for nosebleeds and sore throat.   Eyes:  Negative for double vision.  Respiratory:  Negative for wheezing.   Cardiovascular:  Negative for chest pain, palpitations, orthopnea and leg swelling.  Gastrointestinal:  Negative for abdominal pain, blood in stool, constipation, diarrhea, heartburn, melena, nausea and vomiting.       Sometimes- dysphagia  Genitourinary:  Negative for dysuria, frequency and urgency.  Musculoskeletal:  Negative for back pain and joint pain.  Skin: Negative.  Negative for itching and rash.   Neurological:  Negative for dizziness, tingling, focal weakness, weakness and headaches.  Endo/Heme/Allergies:  Does not bruise/bleed easily.  Psychiatric/Behavioral:  Negative for depression. The patient is not nervous/anxious and does not have insomnia.    PAST MEDICAL HISTORY :  Past Medical History:  Diagnosis Date   Alcoholism ([place])    stopped 1994   Anemia    IRON INFUSIONS   Arthritis    osteoarthritis/ RA   Asthma    remote h/o asthma   Depression    controlled   Dyspnea    GERD (gastroesophageal reflux disease)    H/O hypoglycemia    H/O: GI bleed    Headache(784.0)    h/o migraines   Hearing loss, bilateral    wears hearing aides   History of blood transfusion    pt states she had yellow jaundice   History of kidney stones    Hypertension    H/O NO TX NOW   Lupus (systemic lupus erythematosus) ([place]) 1995   in remission since 2005   Nasal sinus congestion    Osteoporosis with fracture    Peptic ulcer    Pneumonia    Stress fracture of foot    left foot   Vitamin B 12 deficiency     PAST SURGICAL HISTORY :   Past Surgical History:  Procedure Laterality Date   ANTERIOR CERVICAL DECOMP/DISCECTOMY FUSION  2009   BACK SURGERY     CATARACT EXTRACTION W/PHACO Left 09/30/2016   Procedure: CATARACT EXTRACTION PHACO AND INTRAOCULAR LENS PLACEMENT ([place]);  Surgeon: Birder Robson, MD;  Location: [facility]  ORS;  Service: Ophthalmology;  Laterality: Left;  Lot# [id number] H [place]: 00:43.6 AP%: 19.6 CDE: 8.55   CATARACT EXTRACTION W/PHACO Right 01/11/2019   Procedure: CATARACT EXTRACTION PHACO AND INTRAOCULAR LENS PLACEMENT ([place]) RIGHT;  Surgeon: Birder Robson, MD;  Location: [facility] ORS;  Service: Ophthalmology;  Laterality: Right;  [place] 01:03.9 CDE 9.19 Fluid pack Lot # [id number]   COLONOSCOPY N/A 04/03/2022   Procedure: COLONOSCOPY;  Surgeon: [name], DO;  Location: [facility] ENDOSCOPY;  Service: Gastroenterology;  Laterality: N/A;   CYSTOSCOPY W/ RETROGRADES   09/02/2022   Procedure: CYSTOSCOPY WITH RETROGRADE PYELOGRAM;  Surgeon: Abbie Sons, MD;  Location: [facility] ORS;  Service: Urology;;   CYSTOSCOPY/URETEROSCOPY/HOLMIUM LASER/STENT PLACEMENT Left 09/02/2022   Procedure: CYSTOSCOPY/URETEROSCOPY/HOLMIUM LASER/STENT PLACEMENT;  Surgeon: Abbie Sons, MD;  Location: [facility] ORS;  Service: Urology;  Laterality: Left;   ESOPHAGOGASTRODUODENOSCOPY N/A 04/03/2022   Procedure: ESOPHAGOGASTRODUODENOSCOPY (EGD);  Surgeon: [name], DO;  Location: [facility] ENDOSCOPY;  Service: Gastroenterology;  Laterality: N/A;   INTRAMEDULLARY (IM) NAIL INTERTROCHANTERIC Right 07/01/2016   Procedure: INTRAMEDULLARY (IM) NAIL EXCHANGE OF FEMORAL ROD;  Surgeon: Hessie Knows, MD;  Location: [facility] ORS;  Service: Orthopedics;  Laterality: Right;   ORIF FEMUR FRACTURE  2012   PARTIAL GASTRECTOMY  1963   and reconstruction 1973   RECTAL POLYPECTOMY  2023   TONSILLECTOMY  1945    FAMILY HISTORY :   Family History  Problem Relation Age of Onset   Breast cancer Neg Hx     SOCIAL HISTORY:   Social History   Tobacco Use   Smoking status: Never   Smokeless tobacco: Never  Vaping Use   Vaping Use: Never used  Substance Use Topics   Alcohol use: No   Drug use: No    ALLERGIES:  is allergic to sulfa antibiotics.  MEDICATIONS:  Current Outpatient Medications  Medication Sig Dispense Refill   albuterol (VENTOLIN HFA) 108 (90 Base) MCG/ACT inhaler Inhale into the lungs every 6 (six) hours as needed for wheezing or shortness of breath.     amoxicillin (AMOXIL) 875 MG tablet Take 1 tablet (875 mg total) by mouth 2 (two) times daily. 14 tablet 0   BELSOMRA 10 MG TABS Take 10 mg by mouth at bedtime.   5   Calcium Carb-Cholecalciferol (CALTRATE 600+D3 PO) Take 1 tablet by mouth 2 (two) times daily.     cholecalciferol (VITAMIN D) 1000 UNITS tablet Take 1,000 Units by mouth every evening.      citalopram (CELEXA) 20 MG tablet Take 20 mg by mouth daily at 8 pm. (1900)      clobetasol (TEMOVATE) 0.05 % external solution MIX IN A TUB OF CERAVE CREAM AND USE EVERYDAY UP TO TWICE DAILY ON ITCHY RASH UNTIL CLEAR, AVOID [place], [place], [place]. 50 mL 0   cyanocobalamin (,VITAMIN B-12,) 1000 MCG/ML injection Inject 1,000 mcg into the muscle every 30 (thirty) days.      denosumab (PROLIA) 60 MG/ML SOSY injection Inject 60 mg into the skin every 6 (six) months.      esomeprazole (NEXIUM) 40 MG capsule Take 40 mg by mouth 2 (two) times daily before a meal.     Fe Fum-FA-B Cmp-C-Zn-Mg-Mn-Cu (HEMATINIC PLUS VIT/MINERALS) 106-1 MG TABS Take 1 tablet by mouth daily with lunch.   3   gabapentin (NEURONTIN) 100 MG capsule Take 100-200 mg by mouth See admin instructions. Take 1 capsule (100 mg) by mouth in the morning & take 2 capsules (200 mg) by mouth at night.  HYDROcodone-acetaminophen (NORCO/VICODIN) 5-325 MG tablet Take 1 tablet by mouth every 4 (four) hours as needed (pain.).     megestrol (MEGACE) 40 MG/ML suspension Take 800 mg by mouth daily.     meloxicam (MOBIC) 7.5 MG tablet Take 7.5 mg by mouth daily with supper.     mirtazapine (REMERON) 45 MG tablet Take 1 tablet by mouth at bedtime.     Multiple Vitamins-Minerals (PRESERVISION AREDS) TABS Take 1 tablet by mouth 2 (two) times daily.      Travoprost, BAK Free, (TRAVATAN) 0.004 % SOLN ophthalmic solution SMARTSIG:1 Drop(s) In Eye(s) Every Evening     trospium (SANCTURA) 20 MG tablet Take 1 tablet (20 mg total) by mouth 2 (two) times daily as needed (Frequency, urgency, burning, bladder spasm). 30 tablet 0   vitamin C (ASCORBIC ACID) 500 MG tablet Take 500 mg by mouth 2 (two) times daily.     venlafaxine XR (EFFEXOR-XR) 37.5 MG 24 hr capsule Take by mouth.     No current facility-administered medications for this visit.    PHYSICAL EXAMINATION: ECOG PERFORMANCE STATUS: 0 - Asymptomatic  BP 105/73 (Patient Position: Sitting)   Pulse 96   Temp 97.6 F (36.4 C) (Tympanic)   Resp 17   Wt 81 lb 6.4 oz (36.9 kg)    SpO2 100%   BMI 14.89 kg/m   Filed Weights   09/17/22 1327  Weight: 81 lb 6.4 oz (36.9 kg)    Physical Exam Constitutional:      Comments: Thin built Caucasian female patient.  HENT:     Head: Normocephalic and atraumatic.     Mouth/Throat:     Pharynx: No oropharyngeal exudate.  Eyes:     Pupils: Pupils Renee equal, round, and reactive to light.  Cardiovascular:     Rate and Rhythm: Normal rate and regular rhythm.  Pulmonary:     Effort: Pulmonary effort is normal. No respiratory distress.     Breath sounds: Normal breath sounds. No wheezing.  Abdominal:     General: Bowel sounds Renee normal. There is no distension.     Palpations: Abdomen is soft. There is no mass.     Tenderness: There is no abdominal tenderness. There is no guarding or rebound.  Musculoskeletal:        General: No tenderness. Normal range of motion.     Cervical back: Normal range of motion and neck supple.  Skin:    General: Skin is warm.  Neurological:     Mental Status: She is alert and oriented to person, place, and time.  Psychiatric:        Mood and Affect: Affect normal.      LABORATORY DATA:  I have reviewed the data as listed    Component Value Date/Time   NA 138 09/15/2022 1240   NA 142 02/09/2013 0515   K 4.5 09/15/2022 1240   K 4.0 02/09/2013 0515   CL 110 09/15/2022 1240   CL 113 (H) 02/09/2013 0515   CO2 22 09/15/2022 1240   CO2 25 02/09/2013 0515   GLUCOSE 87 09/15/2022 1240   GLUCOSE 80 02/09/2013 0515   BUN 28 (H) 09/15/2022 1240   BUN 20 (H) 02/09/2013 0515   CREATININE 1.55 (H) 09/15/2022 1240   CREATININE 0.58 (L) 02/09/2013 0515   CALCIUM 8.8 (L) 09/15/2022 1240   CALCIUM 7.5 (L) 02/09/2013 0515   PROT 7.2 09/11/2021 1043   PROT 6.8 02/08/2013 1558   ALBUMIN 3.9 09/11/2021 1043   ALBUMIN 3.8 02/08/2013  1558   AST 18 09/11/2021 1043   AST 16 02/08/2013 1558   ALT 11 09/11/2021 1043   ALT 12 02/08/2013 1558   ALKPHOS 52 09/11/2021 1043   ALKPHOS 53 02/08/2013  1558   BILITOT 0.1 (L) 09/11/2021 1043   BILITOT 0.2 02/08/2013 1558   GFRNONAA 33 (L) 09/15/2022 1240   GFRNONAA >60 02/09/2013 0515   GFRAA >60 10/06/2019 0902   GFRAA >60 02/09/2013 0515    No results found for: "SPEP", "UPEP"  Lab Results  Component Value Date   WBC 9.8 09/15/2022   NEUTROABS 6.3 09/15/2022   HGB 13.5 09/15/2022   HCT 42.6 09/15/2022   MCV 99.3 09/15/2022   PLT 458 (H) 09/15/2022      Chemistry      Component Value Date/Time   NA 138 09/15/2022 1240   NA 142 02/09/2013 0515   K 4.5 09/15/2022 1240   K 4.0 02/09/2013 0515   CL 110 09/15/2022 1240   CL 113 (H) 02/09/2013 0515   CO2 22 09/15/2022 1240   CO2 25 02/09/2013 0515   BUN 28 (H) 09/15/2022 1240   BUN 20 (H) 02/09/2013 0515   CREATININE 1.55 (H) 09/15/2022 1240   CREATININE 0.58 (L) 02/09/2013 0515      Component Value Date/Time   CALCIUM 8.8 (L) 09/15/2022 1240   CALCIUM 7.5 (L) 02/09/2013 0515   ALKPHOS 52 09/11/2021 1043   ALKPHOS 53 02/08/2013 1558   AST 18 09/11/2021 1043   AST 16 02/08/2013 1558   ALT 11 09/11/2021 1043   ALT 12 02/08/2013 1558   BILITOT 0.1 (L) 09/11/2021 1043   BILITOT 0.2 02/08/2013 1558       RADIOGRAPHIC STUDIES: I have personally reviewed the radiological images as listed and agreed with the findings in the report. No results found.   ASSESSMENT & PLAN:  Iron deficiency anemia following bariatric surgery # Iron deficient anemia-s/p partial gastrectomy-hemoglobin stable today at 13.5. Ferritin is a bit low at 49. Venofer today.    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  [avoid nephrotoxins]- She will try to reduce her soda use and increase water intake some. Also recently treated for pyelonephritis after kidney stone removal which also may be why creatinine is up a bit. Not overly concerned at this time and she is followed by PCP and Urology.    # B12 deficiency secondary to surgery- continue parental B12 with PCP--STABLE. No changes to  this plan at this time   # weight loss/fatigue: awaiting EGD/coloscopy [Dr.Russow] PET scan [Dr.A]- Negative.  S/p evaluation with Joli which I have recommend she continue.      # DISPOSITION: Venofer today # follow up in 6 month- MD-possible Venofer-; labs- 1-2 days prior- cbc/BMP/crp/iron studies/ferritin- [place], PA-C 09/17/2022 3:13 PM

## 2022-09-17 NOTE — Progress Notes (Signed)
Patient here for oncology follow-up appointment,  concerns of recent fall, nodule left arm

## 2022-09-20 LAB — CULTURE, URINE COMPREHENSIVE

## 2022-10-30 ENCOUNTER — Other Ambulatory Visit: Payer: Self-pay | Admitting: Urology

## 2022-11-08 ENCOUNTER — Other Ambulatory Visit: Payer: Self-pay | Admitting: Urology

## 2022-12-01 ENCOUNTER — Ambulatory Visit (INDEPENDENT_AMBULATORY_CARE_PROVIDER_SITE_OTHER): Payer: Medicare PPO | Admitting: Dermatology

## 2022-12-01 VITALS — BP 140/85 | HR 94

## 2022-12-01 DIAGNOSIS — L409 Psoriasis, unspecified: Secondary | ICD-10-CM

## 2022-12-01 DIAGNOSIS — D1721 Benign lipomatous neoplasm of skin and subcutaneous tissue of right arm: Secondary | ICD-10-CM

## 2022-12-01 NOTE — Progress Notes (Signed)
Follow-Up Visit   Subjective  Renee Cabrera is a 82 y.o. female who presents for the following: growth (R arm, 21m pt fell and got a lump that will come and go.  Bothersome to patient.), Total body skin exam, and Psoriasis (Scalp, Taclonex scalp solution prn, gets a sore on left scalp that will itch.  She tends to pick at it.).  The patient presents for Total-Body Skin Exam (TBSE) for skin cancer screening and mole check.  The patient has spots, moles and lesions to be evaluated, some may be new or changing and the patient has concerns that these could be cancer.   The following portions of the chart were reviewed this encounter and updated as appropriate:       Review of Systems:  No other skin or systemic complaints except as noted in HPI or Assessment and Plan.  Objective  Well appearing patient in no apparent distress; mood and affect are within normal limits.  A full examination was performed including scalp, head, eyes, ears, nose, lips, neck, chest, axillae, abdomen, back, buttocks, bilateral upper extremities, bilateral lower extremities, hands, feet, fingers, toes, fingernails, and toenails. All findings within normal limits unless otherwise noted below.  Right Forearm 4.0 x 3.0cm rubbery nodule       Scalp Mild erythema and scale occipital scalp Firm pink flesh pap L post neck at hairline    Assessment & Plan   Seborrheic Keratoses - Stuck-on, waxy, tan-brown papules and/or plaques  - Benign-appearing - Discussed benign etiology and prognosis. - Observe - Call for any changes - L jaw, abdomen  Hemangiomas - Red papules - Discussed benign nature - Observe - Call for any changes - abdomen  Actinic Damage - Chronic condition, secondary to cumulative UV/sun exposure - diffuse scaly erythematous macules with underlying dyspigmentation - Recommend daily broad spectrum sunscreen SPF 30+ to sun-exposed areas, reapply every 2 hours as needed.  - Staying  in the shade or wearing long sleeves, sun glasses (UVA+UVB protection) and wide brim hats (4-inch brim around the entire circumference of the hat) are also recommended for sun protection.  - Call for new or changing lesions.  Skin cancer screening performed today.   Purpura - Chronic; persistent and recurrent.  Treatable, but not curable. - Violaceous macules and patches - Benign - Related to trauma, age, sun damage and/or use of blood thinners, chronic use of topical and/or oral steroids - Observe - Can use OTC arnica containing moisturizer such as Dermend Bruise Formula if desired - Call for worsening or other concerns  - bil pretibia  Lipoma of right upper extremity Right Forearm  Benign appearing  Discussed txt options - observation vs excising vs IL Kenalog injections.  Pt prefers IL Kenalog.  IL Kenalog '40mg'$ /ml today [id number] lot # [id number] 10/2023  Intralesional steroid injection side effects were reviewed including thinning of the skin and discoloration, such as redness, lightening or darkening. Also risk of possible systemic effects, including mood irritability, insomnia, weight gain, increased risk of infection, increased blood sugar (diabetes), hypertension, osteoporosis with long-term or frequent use, and rare risk of avascular necrosis of the hip.    Intralesional injection - Right Forearm Location: R forearm  Informed Consent: Discussed risks (infection, pain, bleeding, bruising, thinning of the skin, loss of skin pigment, lack of resolution, and recurrence of lesion) and benefits of the procedure, as well as the alternatives. Informed consent was obtained. Preparation: The area was prepared a standard fashion.  Procedure Details:  An intralesional injection was performed with Kenalog 40 mg/cc. 1.0 cc in total were injected.  Total number of injections: 3 into body of Lipoma  Plan: The patient was instructed on post-op care. Recommend OTC analgesia as  needed for pain.   Psoriasis Scalp  With Prurigo Nodule, Chronic and persistent condition with duration or expected duration over one year. Condition is symptomatic/ bothersome to patient. Not currently at goal.   Counseling on psoriasis and coordination of care  psoriasis is a chronic non-curable, but treatable genetic/hereditary disease that may have other systemic features affecting other organ systems such as joints (Psoriatic Arthritis). It is associated with an increased risk of inflammatory bowel disease, heart disease, non-alcoholic fatty liver disease, and depression.  Treatments include light and laser treatments; topical medications; and systemic medications including oral and injectables.   Avoid scratching prurigo nodule, discussed IL Kenalog to bump, pt defers  Cont Taclonex solution qd prn flares scalp  Topical steroids (such as triamcinolone, fluocinolone, fluocinonide, mometasone, clobetasol, halobetasol, betamethasone, hydrocortisone) can cause thinning and lightening of the skin if they are used for too long in the same area. Your physician has selected the right strength medicine for your problem and area affected on the body. Please use your medication only as directed by your physician to prevent side effects.     Return in about 6 weeks (around 01/12/2023) for lipoma f/u, 1 yr TBSE.  I, Othelia Pulling, RMA, am acting as scribe for Brendolyn Patty, MD .  Documentation: I have reviewed the above documentation for accuracy and completeness, and I agree with the above.  Brendolyn Patty MD

## 2022-12-01 NOTE — Patient Instructions (Signed)
Due to recent changes in healthcare laws, you may see results of your pathology and/or laboratory studies on MyChart before the doctors have had a chance to review them. We understand that in some cases there may be results that are confusing or concerning to you. Please understand that not all results are received at the same time and often the doctors may need to interpret multiple results in order to provide you with the best plan of care or course of treatment. Therefore, we ask that you please give us 2 business days to thoroughly review all your results before contacting the office for clarification. Should we see a critical lab result, you will be contacted sooner.   If You Need Anything After Your Visit  If you have any questions or concerns for your doctor, please call our main line at [phone] and press option 4 to reach your doctor's medical assistant. If no one answers, please leave a voicemail as directed and we will return your call as soon as possible. Messages left after 4 pm will be answered the following business day.   You may also send us a message via MyChart. We typically respond to MyChart messages within 1-2 business days.  For prescription refills, please ask your pharmacy to contact our office. Our fax number is [phone].  If you have an urgent issue when the clinic is closed that cannot wait until the next business day, you can page your doctor at the number below.    Please note that while we do our best to be available for urgent issues outside of office hours, we are not available 24/7.   If you have an urgent issue and are unable to reach us, you may choose to seek medical care at your doctor's office, retail clinic, urgent care center, or emergency room.  If you have a medical emergency, please immediately call 911 or go to the emergency department.  Pager Numbers  - Dr. Kowalski: [phone]  - Dr. Moye: [phone]  - Dr. Stewart:  [phone]  In the event of inclement weather, please call our main line at [phone] for an update on the status of any delays or closures.  Dermatology Medication Tips: Please keep the boxes that topical medications come in in order to help keep track of the instructions about where and how to use these. Pharmacies typically print the medication instructions only on the boxes and not directly on the medication tubes.   If your medication is too expensive, please contact our office at [phone] option 4 or send us a message through MyChart.   We are unable to tell what your co-pay for medications will be in advance as this is different depending on your insurance coverage. However, we may be able to find a substitute medication at lower cost or fill out paperwork to get insurance to cover a needed medication.   If a prior authorization is required to get your medication covered by your insurance company, please allow us 1-2 business days to complete this process.  Drug prices often vary depending on where the prescription is filled and some pharmacies may offer cheaper prices.  The website www.goodrx.com contains coupons for medications through different pharmacies. The prices here do not account for what the cost may be with help from insurance (it may be cheaper with your insurance), but the website can give you the price if you did not use any insurance.  - You can print the associated coupon and take it with   your prescription to the pharmacy.  - You may also stop by our office during regular business hours and pick up a GoodRx coupon card.  - If you need your prescription sent electronically to a different pharmacy, notify our office through [place] MyChart or by phone at [phone] option 4.     Si Usted Necesita Algo Despus de Su Visita  Tambin puede enviarnos un mensaje a travs de MyChart. Por lo general respondemos a los mensajes de MyChart en el transcurso de 1 a 2  das hbiles.  Para renovar recetas, por favor pida a su farmacia que se ponga en contacto con nuestra oficina. Nuestro nmero de fax es el [phone].  Si tiene un asunto urgente cuando la clnica est cerrada y que no puede esperar hasta el siguiente da hbil, puede llamar/localizar a su doctor(a) al nmero que aparece a continuacin.   Por favor, tenga en cuenta que aunque hacemos todo lo posible para estar disponibles para asuntos urgentes fuera del horario de oficina, no estamos disponibles las 24 horas del da, los 7 das de la semana.   Si tiene un problema urgente y no puede comunicarse con nosotros, puede optar por buscar atencin mdica  en el consultorio de su doctor(a), en una clnica privada, en un centro de atencin urgente o en una sala de emergencias.  Si tiene una emergencia mdica, por favor llame inmediatamente al 911 o vaya a la sala de emergencias.  Nmeros de bper  - Dr. Kowalski: [phone]  - Dra. Moye: [phone]  - Dra. Stewart: [phone]  En caso de inclemencias del tiempo, por favor llame a nuestra lnea principal al [phone] para una actualizacin sobre el estado de cualquier retraso o cierre.  Consejos para la medicacin en dermatologa: Por favor, guarde las cajas en las que vienen los medicamentos de uso tpico para ayudarle a seguir las instrucciones sobre dnde y cmo usarlos. Las farmacias generalmente imprimen las instrucciones del medicamento slo en las cajas y no directamente en los tubos del medicamento.   Si su medicamento es muy caro, por favor, pngase en contacto con nuestra oficina llamando al [phone] y presione la opcin 4 o envenos un mensaje a travs de MyChart.   No podemos decirle cul ser su copago por los medicamentos por adelantado ya que esto es diferente dependiendo de la cobertura de su seguro. Sin embargo, es posible que podamos encontrar un medicamento sustituto a menor costo o llenar un formulario para que el  seguro cubra el medicamento que se considera necesario.   Si se requiere una autorizacin previa para que su compaa de seguros cubra su medicamento, por favor permtanos de 1 a 2 das hbiles para completar este proceso.  Los precios de los medicamentos varan con frecuencia dependiendo del lugar de dnde se surte la receta y alguna farmacias pueden ofrecer precios ms baratos.  El sitio web www.goodrx.com tiene cupones para medicamentos de diferentes farmacias. Los precios aqu no tienen en cuenta lo que podra costar con la ayuda del seguro (puede ser ms barato con su seguro), pero el sitio web puede darle el precio si no utiliz ningn seguro.  - Puede imprimir el cupn correspondiente y llevarlo con su receta a la farmacia.  - Tambin puede pasar por nuestra oficina durante el horario de atencin regular y recoger una tarjeta de cupones de GoodRx.  - Si necesita que su receta se enve electrnicamente a una farmacia diferente, informe a nuestra oficina a travs de MyChart de [place]   o por telfono llamando al [phone] y presione la opcin 4.  

## 2022-12-25 ENCOUNTER — Other Ambulatory Visit: Payer: Self-pay | Admitting: Family Medicine

## 2022-12-25 DIAGNOSIS — Z1231 Encounter for screening mammogram for malignant neoplasm of breast: Secondary | ICD-10-CM

## 2022-12-25 DIAGNOSIS — N200 Calculus of kidney: Secondary | ICD-10-CM

## 2022-12-26 ENCOUNTER — Ambulatory Visit
Admission: RE | Admit: 2022-12-26 | Discharge: 2022-12-26 | Disposition: A | Discharge status: Home / Self Care | Payer: Medicare PPO | Attending: Urology | Admitting: Urology

## 2022-12-26 ENCOUNTER — Ambulatory Visit: Payer: Medicare PPO | Admitting: Urology

## 2022-12-26 ENCOUNTER — Encounter: Payer: Self-pay | Admitting: Urology

## 2022-12-26 ENCOUNTER — Ambulatory Visit
Admission: RE | Admit: 2022-12-26 | Discharge: 2022-12-26 | Disposition: A | Discharge status: Home / Self Care | Payer: Medicare PPO | Source: Ambulatory Visit | Attending: Urology | Admitting: Urology

## 2022-12-26 VITALS — BP 149/88 | HR 78 | Ht 59.0 in | Wt 83.5 lb

## 2022-12-26 DIAGNOSIS — N200 Calculus of kidney: Secondary | ICD-10-CM | POA: Insufficient documentation

## 2022-12-26 NOTE — Progress Notes (Signed)
12/26/2022 10:08 AM   Renee Cabrera 20-Oct-1941 [id number]  Referring provider: [name], MD [address] [facility] [place],  [place] [address]  Chief Complaint  Patient presents with   Follow-up    67mh follow-up    Nephrolithiasis   Urologic history: 1.  Nephrolithiasis Bilateral nonobstructing renal calculi Ureteroscopic removal of a left 10 mm renal pelvic calculus and 8 mm infundibular calculus 09/02/2022   HPI: 82y.o. female presents for 3 month follow-up   Doing well since last visit No bothersome LUTS Denies dysuria, gross hematuria Denies flank, abdominal or pelvic pain   PMH: Past Medical History:  Diagnosis Date   [place]ism [place]    stopped 1994   Anemia    IRON INFUSIONS   Arthritis    osteoarthritis/ RA   Asthma    remote h/o asthma   Depression    controlled   Dyspnea    GERD (gastroesophageal reflux disease)    H/O hypoglycemia    H/O: GI bleed    Headache(784.0)    h/o migraines   Hearing loss, bilateral    wears hearing aides   History of blood transfusion    pt states she had yellow jaundice   History of kidney stones    Hypertension    H/O NO TX NOW   Lupus ([place]ic lupus erythematosus) [place] 1995   in remission since 2005   Nasal sinus congestion    Osteoporosis with fracture    Peptic ulcer    Pneumonia    Stress fracture of foot    left foot   Vitamin B 12 deficiency     Surgical History: Past Surgical History:  Procedure Laterality Date   ANTERIOR CERVICAL DECOMP/DISCECTOMY FUSION  2009   BACK SURGERY     CATARACT EXTRACTION W/PHACO Left 09/30/2016   Procedure: CATARACT EXTRACTION PHACO AND INTRAOCULAR LENS PLACEMENT ([place];  Surgeon: WBirder Robson MD;  Location: [facility] ORS;  Service: Ophthalmology;  Laterality: Left;  Lot# [id number] [place] 00:43.6 AP%: 19.6 CDE: 8.55   CATARACT EXTRACTION W/PHACO Right 01/11/2019   Procedure: CATARACT EXTRACTION PHACO AND INTRAOCULAR LENS PLACEMENT ([place];   Surgeon: PBirder Robson MD;  Location: [facility] OR[facility];  Service: Ophthalmology;  Laterality: Right;  [place]01:03.9 CDE 9.19 Fluid pack Lot # [id number]   COLONOSCOPY N/A 04/03/2022   Procedure: COLONOSCOPY;  Surgeon: RAnnamaria Helling DO;  [facility];  Service: Gastro[facility]ology;  Laterality: N/A;   CYSTOSCOPY W/ RETROGRADES  09/02/2022   Procedure: CYSTOSCOPY WITH RETROGRADE PYELOGRAM;  Surgeon: SAbbie Sons MD;  Location: [facility] OR[facility];  Service: Urology;;   CYSTOSCOPY/URETEROSCOPY/HOLMIUM LASER/STENT PLACEMENT Left 09/02/2022   Procedure: CYSTOSCOPY/URETEROSCOPY/HOLMIUM LASER/STENT PLACEMENT;  Surgeon: SAbbie Sons MD;  Location: [facility] OR[facility];  Service: Urology;  Laterality: Left;   ESOPHAGOGASTRODUODENOSCOPY N/A 04/03/2022   Procedure: ESOPHAGOGASTRODUODENOSCOPY (EGD);  Surgeon: RAnnamaria Helling DO;  [facility];  Service: Gastro[facility]ology;  Laterality: N/A;   INTRAMEDULLARY (IM) NAIL INTERTROCHANTERIC Right 07/01/2016   Procedure: INTRAMEDULLARY (IM) NAIL EXCHANGE OF FEMORAL ROD;  Surgeon: MHessie Knows MD;  Location: [facility] ORS;  Service: Orthopedics;  Laterality: Right;   ORIF FEMUR FRACTURE  2012   PARTIAL GASTRECTOMY  1963   and reconstruction 1973   RECTAL POLYPECTOMY  2023   TONSILLECTOMY  1945    Home Medications:  Allergies as of 12/26/2022       Reactions   Sulfa Antibiotics Rash        Medication List  Accurate as of December 26, 2022 10:08 AM. If you have any questions, ask your nurse or doctor.          STOP taking these medications    amoxicillin 875 MG tablet Commonly known as: AMOXIL Stopped by: Abbie Sons, MD   clobetasol 0.05 % external solution Commonly known as: TEMOVATE Stopped by: Abbie Sons, MD   megestrol 40 MG/ML suspension Commonly known as: MEGACE Stopped by: Abbie Sons, MD   meloxicam 7.5 MG tablet Commonly known as: MOBIC Stopped by: Abbie Sons, MD       TAKE these medications     albuterol 108 (90 Base) MCG/ACT inhaler Commonly known as: VENTOLIN HFA Inhale into the lungs every 6 (six) hours as needed for wheezing or shortness of breath.   ascorbic acid 500 MG tablet Commonly known as: VITAMIN C Take 500 mg by mouth 2 (two) times daily.   Belsomra 10 MG Tabs Generic drug: Suvorexant Take 10 mg by mouth at bedtime.   CALTRATE 600+D3 PO Take 1 tablet by mouth 2 (two) times daily.   cholecalciferol 1000 units tablet Commonly known as: VITAMIN D Take 1,000 Units by mouth every evening.   citalopram 20 MG tablet Commonly known as: CELEXA Take 20 mg by mouth daily at 8 pm. (1900)   cyanocobalamin 1000 MCG/ML injection Commonly known as: VITAMIN B12 Inject 1,000 mcg into the muscle every 30 (thirty) days.   denosumab 60 MG/ML Sosy injection Commonly known as: PROLIA Inject 60 mg into the skin every 6 (six) months.   esomeprazole 40 MG capsule Commonly known as: NEXIUM Take 40 mg by mouth 2 (two) times daily before a meal.   gabapentin 100 MG capsule Commonly known as: NEURONTIN Take 100-200 mg by mouth See admin instructions. Take 1 capsule (100 mg) by mouth in the morning & take 2 capsules (200 mg) by mouth at night.   Hematinic Plus Vit/Minerals 106-1 MG Tabs Take 1 tablet by mouth daily with lunch.   HYDROcodone-acetaminophen 5-325 MG tablet Commonly known as: NORCO/VICODIN Take 1 tablet by mouth every 4 (four) hours as needed (pain.).   mirtazapine 45 MG tablet Commonly known as: REMERON Take 1 tablet by mouth at bedtime.   PreserVision AREDS Tabs Take 1 tablet by mouth 2 (two) times daily.   Travoprost (BAK Free) 0.004 % Soln ophthalmic solution Commonly known as: TRAVATAN SMARTSIG:1 Drop(s) In Eye(s) Every Evening   trospium 20 MG tablet Commonly known as: SANCTURA TAKE 1 TABLET (20 MG TOTAL) BY MOUTH 2 (TWO) TIMES DAILY AS NEEDED (FREQUENCY, URGENCY, BURNING, BLADDER SPASM).   venlafaxine XR 37.5 MG 24 hr capsule Commonly  known as: EFFEXOR-XR Take by mouth.        Allergies:  Allergies  Allergen Reactions   Sulfa Antibiotics Rash    Family History: Family History  Problem Relation Age of Onset   Breast cancer Neg Hx     Social History:  reports that she has never smoked. She has never been exposed to tobacco smoke. She has never used smokeless tobacco. She reports that she does not drink alcohol and does not use drugs.   Physical Exam: BP (!) 149/88   Pulse 78   Ht '4\' 11"'$  (1.499 m)   Wt 83 lb 8 oz (37.9 kg)   BMI 16.86 kg/m   Constitutional:  Alert and oriented, No acute distress. HEENT: [place] AT Respiratory: Normal respiratory effort, no increased work of breathing. Psychiatric: Normal mood and affect.  Pertinent Imaging: Images of a KUB performed today were personally reviewed and interpreted.  The left renal calcifications are no longer present.  Stable calcifications overlying the right renal outline   Assessment & Plan:    1.  Nephrolithiasis Doing well status post left ureteroscopic stone removal Asymptomatic, nonobstructing right renal calculi 1 year follow-up with KUB; instructed to call earlier for flank/abdominal pain    Abbie Sons, MD  [facility] [address] [place], [place] [address] [phone]

## 2023-01-13 ENCOUNTER — Ambulatory Visit: Payer: Medicare PPO | Admitting: Dermatology

## 2023-01-13 VITALS — BP 142/76 | HR 84

## 2023-01-13 DIAGNOSIS — D1721 Benign lipomatous neoplasm of skin and subcutaneous tissue of right arm: Secondary | ICD-10-CM | POA: Diagnosis not present

## 2023-01-13 NOTE — Patient Instructions (Signed)
Due to recent changes in healthcare laws, you may see results of your pathology and/or laboratory studies on MyChart before the doctors have had a chance to review them. We understand that in some cases there may be results that are confusing or concerning to you. Please understand that not all results are received at the same time and often the doctors may need to interpret multiple results in order to provide you with the best plan of care or course of treatment. Therefore, we ask that you please give us 2 business days to thoroughly review all your results before contacting the office for clarification. Should we see a critical lab result, you will be contacted sooner.   If You Need Anything After Your Visit  If you have any questions or concerns for your doctor, please call our main line at [phone] and press option 4 to reach your doctor's medical assistant. If no one answers, please leave a voicemail as directed and we will return your call as soon as possible. Messages left after 4 pm will be answered the following business day.   You may also send us a message via MyChart. We typically respond to MyChart messages within 1-2 business days.  For prescription refills, please ask your pharmacy to contact our office. Our fax number is [phone].  If you have an urgent issue when the clinic is closed that cannot wait until the next business day, you can page your doctor at the number below.    Please note that while we do our best to be available for urgent issues outside of office hours, we are not available 24/7.   If you have an urgent issue and are unable to reach us, you may choose to seek medical care at your doctor's office, retail clinic, urgent care center, or emergency room.  If you have a medical emergency, please immediately call 911 or go to the emergency department.  Pager Numbers  - Dr. Kowalski: [phone]  - Dr. Moye: [phone]  - Dr. Stewart:  [phone]  In the event of inclement weather, please call our main line at [phone] for an update on the status of any delays or closures.  Dermatology Medication Tips: Please keep the boxes that topical medications come in in order to help keep track of the instructions about where and how to use these. Pharmacies typically print the medication instructions only on the boxes and not directly on the medication tubes.   If your medication is too expensive, please contact our office at [phone] option 4 or send us a message through MyChart.   We are unable to tell what your co-pay for medications will be in advance as this is different depending on your insurance coverage. However, we may be able to find a substitute medication at lower cost or fill out paperwork to get insurance to cover a needed medication.   If a prior authorization is required to get your medication covered by your insurance company, please allow us 1-2 business days to complete this process.  Drug prices often vary depending on where the prescription is filled and some pharmacies may offer cheaper prices.  The website www.goodrx.com contains coupons for medications through different pharmacies. The prices here do not account for what the cost may be with help from insurance (it may be cheaper with your insurance), but the website can give you the price if you did not use any insurance.  - You can print the associated coupon and take it with   your prescription to the pharmacy.  - You may also stop by our office during regular business hours and pick up a GoodRx coupon card.  - If you need your prescription sent electronically to a different pharmacy, notify our office through [place] MyChart or by phone at [phone] option 4.     Si Usted Necesita Algo Despus de Su Visita  Tambin puede enviarnos un mensaje a travs de MyChart. Por lo general respondemos a los mensajes de MyChart en el transcurso de 1 a 2  das hbiles.  Para renovar recetas, por favor pida a su farmacia que se ponga en contacto con nuestra oficina. Nuestro nmero de fax es el [phone].  Si tiene un asunto urgente cuando la clnica est cerrada y que no puede esperar hasta el siguiente da hbil, puede llamar/localizar a su doctor(a) al nmero que aparece a continuacin.   Por favor, tenga en cuenta que aunque hacemos todo lo posible para estar disponibles para asuntos urgentes fuera del horario de oficina, no estamos disponibles las 24 horas del da, los 7 das de la semana.   Si tiene un problema urgente y no puede comunicarse con nosotros, puede optar por buscar atencin mdica  en el consultorio de su doctor(a), en una clnica privada, en un centro de atencin urgente o en una sala de emergencias.  Si tiene una emergencia mdica, por favor llame inmediatamente al 911 o vaya a la sala de emergencias.  Nmeros de bper  - Dr. Kowalski: [phone]  - Dra. Moye: [phone]  - Dra. Stewart: [phone]  En caso de inclemencias del tiempo, por favor llame a nuestra lnea principal al [phone] para una actualizacin sobre el estado de cualquier retraso o cierre.  Consejos para la medicacin en dermatologa: Por favor, guarde las cajas en las que vienen los medicamentos de uso tpico para ayudarle a seguir las instrucciones sobre dnde y cmo usarlos. Las farmacias generalmente imprimen las instrucciones del medicamento slo en las cajas y no directamente en los tubos del medicamento.   Si su medicamento es muy caro, por favor, pngase en contacto con nuestra oficina llamando al [phone] y presione la opcin 4 o envenos un mensaje a travs de MyChart.   No podemos decirle cul ser su copago por los medicamentos por adelantado ya que esto es diferente dependiendo de la cobertura de su seguro. Sin embargo, es posible que podamos encontrar un medicamento sustituto a menor costo o llenar un formulario para que el  seguro cubra el medicamento que se considera necesario.   Si se requiere una autorizacin previa para que su compaa de seguros cubra su medicamento, por favor permtanos de 1 a 2 das hbiles para completar este proceso.  Los precios de los medicamentos varan con frecuencia dependiendo del lugar de dnde se surte la receta y alguna farmacias pueden ofrecer precios ms baratos.  El sitio web www.goodrx.com tiene cupones para medicamentos de diferentes farmacias. Los precios aqu no tienen en cuenta lo que podra costar con la ayuda del seguro (puede ser ms barato con su seguro), pero el sitio web puede darle el precio si no utiliz ningn seguro.  - Puede imprimir el cupn correspondiente y llevarlo con su receta a la farmacia.  - Tambin puede pasar por nuestra oficina durante el horario de atencin regular y recoger una tarjeta de cupones de GoodRx.  - Si necesita que su receta se enve electrnicamente a una farmacia diferente, informe a nuestra oficina a travs de MyChart de [place]   o por telfono llamando al [phone] y presione la opcin 4.  

## 2023-01-13 NOTE — Progress Notes (Signed)
   Follow-Up Visit   Subjective  Renee Cabrera is a 82 y.o. female who presents for the following: Follow-up (6 weeks f/u on the right arm lipoma appeared after patient fell and hit her arm, treated with ILK 40 mg 6 weeks ago with a good response. ).   The following portions of the chart were reviewed this encounter and updated as appropriate:       Review of Systems:  No other skin or systemic complaints except as noted in HPI or Assessment and Plan.  Objective  Well appearing patient in no apparent distress; mood and affect are within normal limits.  A focused examination was performed including right arm. Relevant physical exam findings are noted in the Assessment and Plan.  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  Pt prefers IL Kenalog. May consider excision removal if no better of f/up   IL Kenalog 40mg /ml today [facility] 0003-0293-28, lot # [id number] exp 10/2023   Intralesional steroid injection side effects were reviewed including thinning of the skin and discoloration, such as redness, lightening or darkening. Also risk of possible systemic effects, including mood irritability, insomnia, weight gain, increased risk of infection, increased blood sugar (diabetes), hypertension, osteoporosis with long-term or frequent use, and rare risk of avascular necrosis of the hip.    Intralesional injection - right forearm Location: right forearm   Informed Consent: Discussed risks (infection, pain, bleeding, bruising, thinning of the skin, loss of skin pigment, lack of resolution, and recurrence of lesion) and benefits of the procedure, as well as the alternatives. Informed consent was obtained. Preparation: The area was prepared a standard  fashion.  Anesthesia:none  Procedure Details: An intralesional injection was performed with Kenalog 40 mg/cc. 1 cc in total were injected in body of lipoma.  Total number of injections: 4  Plan: The patient was instructed on post-op care. Recommend OTC analgesia as needed for pain.   Return in about 2 months (around 03/15/2023) for Lipoma.  I, Marye Round, CMA, am acting as scribe for Brendolyn Patty, MD .   Documentation: I have reviewed the above documentation for accuracy and completeness, and I agree with the above.  Brendolyn Patty MD

## 2023-01-20 ENCOUNTER — Ambulatory Visit
Admission: RE | Admit: 2023-01-20 | Discharge: 2023-01-20 | Disposition: A | Discharge status: Home / Self Care | Payer: Medicare PPO | Source: Ambulatory Visit | Attending: Family Medicine | Admitting: Family Medicine

## 2023-01-20 DIAGNOSIS — Z1231 Encounter for screening mammogram for malignant neoplasm of breast: Secondary | ICD-10-CM | POA: Diagnosis not present

## 2023-03-10 ENCOUNTER — Ambulatory Visit: Payer: Medicare PPO | Admitting: Dermatology

## 2023-03-10 ENCOUNTER — Encounter: Payer: Self-pay | Admitting: Dermatology

## 2023-03-10 VITALS — BP 150/80 | HR 88

## 2023-03-10 DIAGNOSIS — D692 Other nonthrombocytopenic purpura: Secondary | ICD-10-CM | POA: Diagnosis not present

## 2023-03-10 DIAGNOSIS — D489 Neoplasm of uncertain behavior, unspecified: Secondary | ICD-10-CM

## 2023-03-10 NOTE — Progress Notes (Signed)
   Follow-Up Visit   Subjective  Renee Cabrera is a 82 y.o. female who presents for the following: 2 month recheck. Lipoma at right forearm. 2nd ILK injection at last visit. Patient states goes down some.  Patient reports this lesion came up after a fall several months ago.    The following portions of the chart were reviewed this encounter and updated as appropriate: medications, allergies, medical history  Review of Systems:  No other skin or systemic complaints except as noted in HPI or Assessment and Plan.  Objective  Well appearing patient in no apparent distress; mood and affect are within normal limits.  A focused examination was performed of the following areas: Right arm  Relevant exam findings are noted in the Assessment and Plan.  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  No significant change with ILK injection Will hold ILK injection today due to increased purpura at arms and legs. Concerned with systemic side effects as she is petite.   Discussed referral to orthopedic surgeon for further evaluation, possible excision since could be bursa or ganglion cyst. Referral placed to Dr. Rosita Kea.  Dr. Gwen Pounds examined patient and agreed with evaluation.    Purpura - Chronic; persistent and recurrent.  Treatable, but not curable. - Violaceous macules and patches arms and legs - Benign - Related to trauma, age, sun damage and/or use of blood thinners, chronic use of topical and/or oral steroids - Observe - Can use OTC arnica containing moisturizer such as Dermend Bruise Formula if desired - Call for worsening or other concerns   Return for TBSE As Scheduled.  I, Lawson Radar, CMA, am acting as scribe for  Willeen Niece, MD.   Documentation: I have reviewed the above documentation for accuracy and completeness, and I agree with the above.  Willeen Niece, MD

## 2023-03-10 NOTE — Patient Instructions (Signed)
 Pre-Operative Instructions  You are scheduled for a surgical procedure at [facility]. We recommend you read the following instructions. If you have any questions or concerns, please call the office at [phone].  Shower and wash the entire body with soap and water the day of your surgery paying special attention to cleansing at and around the planned surgery site.  Avoid aspirin or aspirin containing products at least fourteen (14) days prior to your surgical procedure and for at least one week (7 Days) after your surgical procedure. If you take aspirin on a regular basis for heart disease or history of stroke or for any other reason, we may recommend you continue taking aspirin but please notify us if you take this on a regular basis. Aspirin can cause more bleeding to occur during surgery as well as prolonged bleeding and bruising after surgery.   Avoid other nonsteroidal pain medications at least one week prior to surgery and at least one week prior to your surgery. These include medications such as Ibuprofen (Motrin, Advil and Nuprin), Naprosyn, Voltaren, Relafen, etc. If medications are used for therapeutic reasons, please inform us as they can cause increased bleeding or prolonged bleeding during and bruising after surgical procedures.   Please advise us if you are taking any "blood thinner" medications such as Coumadin or Dipyridamole or Plavix or similar medications. These cause increased bleeding and prolonged bleeding during procedures and bruising after surgical procedures. We may have to consider discontinuing these medications briefly prior to and shortly after your surgery if safe to do so.   Please inform us of all medications you are currently taking. All medications that are taken regularly should be taken the day of surgery as you always do. Nevertheless, we need to be informed of what medications you are taking prior to surgery to know whether they will affect the  procedure or cause any complications.   Please inform us of any medication allergies. Also inform us of whether you have allergies to Latex or rubber products or whether you have had any adverse reaction to Lidocaine or Epinephrine.  Please inform us of any prosthetic or artificial body parts such as artificial heart valve, joint replacements, etc., or similar condition that might require preoperative antibiotics.   We recommend avoidance of alcohol at least two weeks prior to surgery and continued avoidance for at least two weeks after surgery.   We recommend discontinuation of tobacco smoking at least two weeks prior to surgery and continued abstinence for at least two weeks after surgery.  Do not plan strenuous exercise, strenuous work or strenuous lifting for approximately four weeks after your surgery.   We request if you are unable to make your scheduled surgical appointment, please call us at least a week in advance or as soon as you are aware of a problem so that we can cancel or reschedule the appointment.   You MAY TAKE TYLENOL (acetaminophen) for pain as it is not a blood thinner.   PLEASE PLAN TO BE IN TOWN FOR TWO WEEKS FOLLOWING SURGERY, THIS IS IMPORTANT SO YOU CAN BE CHECKED FOR DRESSING CHANGES, SUTURE REMOVAL AND TO MONITOR FOR POSSIBLE COMPLICATIONS.    Due to recent changes in healthcare laws, you may see results of your pathology and/or laboratory studies on MyChart before the doctors have had a chance to review them. We understand that in some cases there may be results that are confusing or concerning to you. Please understand that not all results are   received at the same time and often the doctors may need to interpret multiple results in order to provide you with the best plan of care or course of treatment. Therefore, we ask that you please give us 2 business days to thoroughly review all your results before contacting the office for clarification. Should we see a  critical lab result, you will be contacted sooner.   If You Need Anything After Your Visit  If you have any questions or concerns for your doctor, please call our main line at [phone] and press option 4 to reach your doctor's medical assistant. If no one answers, please leave a voicemail as directed and we will return your call as soon as possible. Messages left after 4 pm will be answered the following business day.   You may also send us a message via MyChart. We typically respond to MyChart messages within 1-2 business days.  For prescription refills, please ask your pharmacy to contact our office. Our fax number is [phone].  If you have an urgent issue when the clinic is closed that cannot wait until the next business day, you can page your doctor at the number below.    Please note that while we do our best to be available for urgent issues outside of office hours, we are not available 24/7.   If you have an urgent issue and are unable to reach us, you may choose to seek medical care at your doctor's office, retail clinic, urgent care center, or emergency room.  If you have a medical emergency, please immediately call 911 or go to the emergency department.  Pager Numbers  - Dr. Kowalski: [phone]  - Dr. Moye: [phone]  - Dr. Stewart: [phone]  In the event of inclement weather, please call our main line at [phone] for an update on the status of any delays or closures.  Dermatology Medication Tips: Please keep the boxes that topical medications come in in order to help keep track of the instructions about where and how to use these. Pharmacies typically print the medication instructions only on the boxes and not directly on the medication tubes.   If your medication is too expensive, please contact our office at [phone] option 4 or send us a message through MyChart.   We are unable to tell what your co-pay for medications will be in advance as  this is different depending on your insurance coverage. However, we may be able to find a substitute medication at lower cost or fill out paperwork to get insurance to cover a needed medication.   If a prior authorization is required to get your medication covered by your insurance company, please allow us 1-2 business days to complete this process.  Drug prices often vary depending on where the prescription is filled and some pharmacies may offer cheaper prices.  The website www.goodrx.com contains coupons for medications through different pharmacies. The prices here do not account for what the cost may be with help from insurance (it may be cheaper with your insurance), but the website can give you the price if you did not use any insurance.  - You can print the associated coupon and take it with your prescription to the pharmacy.  - You may also stop by our office during regular business hours and pick up a GoodRx coupon card.  - If you need your prescription sent electronically to a different pharmacy, notify our office through [facility] MyChart or by phone at [phone] option 4.       Si Usted Necesita Algo Despus de Su Visita  Tambin puede enviarnos un mensaje a travs de MyChart. Por lo general respondemos a los mensajes de MyChart en el transcurso de 1 a 2 das hbiles.  Para renovar recetas, por favor pida a su farmacia que se ponga en contacto con nuestra oficina. Nuestro nmero de fax es el [phone].  Si tiene un asunto urgente cuando la clnica est cerrada y que no puede esperar hasta el siguiente da hbil, puede llamar/localizar a su doctor(a) al nmero que aparece a continuacin.   Por favor, tenga en cuenta que aunque hacemos todo lo posible para estar disponibles para asuntos urgentes fuera del horario de oficina, no estamos disponibles las 24 horas del da, los 7 das de la semana.   Si tiene un problema urgente y no puede comunicarse con nosotros, puede optar por  buscar atencin mdica  en el consultorio de su doctor(a), en una clnica privada, en un centro de atencin urgente o en una sala de emergencias.  Si tiene una emergencia mdica, por favor llame inmediatamente al 911 o vaya a la sala de emergencias.  Nmeros de bper  - Dr. Kowalski: [phone]  - Dra. Moye: [phone]  - Dra. Stewart: [phone]  En caso de inclemencias del tiempo, por favor llame a nuestra lnea principal al [phone] para una actualizacin sobre el estado de cualquier retraso o cierre.  Consejos para la medicacin en dermatologa: Por favor, guarde las cajas en las que vienen los medicamentos de uso tpico para ayudarle a seguir las instrucciones sobre dnde y cmo usarlos. Las farmacias generalmente imprimen las instrucciones del medicamento slo en las cajas y no directamente en los tubos del medicamento.   Si su medicamento es muy caro, por favor, pngase en contacto con nuestra oficina llamando al [phone] y presione la opcin 4 o envenos un mensaje a travs de MyChart.   No podemos decirle cul ser su copago por los medicamentos por adelantado ya que esto es diferente dependiendo de la cobertura de su seguro. Sin embargo, es posible que podamos encontrar un medicamento sustituto a menor costo o llenar un formulario para que el seguro cubra el medicamento que se considera necesario.   Si se requiere una autorizacin previa para que su compaa de seguros cubra su medicamento, por favor permtanos de 1 a 2 das hbiles para completar este proceso.  Los precios de los medicamentos varan con frecuencia dependiendo del lugar de dnde se surte la receta y alguna farmacias pueden ofrecer precios ms baratos.  El sitio web www.goodrx.com tiene cupones para medicamentos de diferentes farmacias. Los precios aqu no tienen en cuenta lo que podra costar con la ayuda del seguro (puede ser ms barato con su seguro), pero el sitio web puede darle el precio si no  utiliz ningn seguro.  - Puede imprimir el cupn correspondiente y llevarlo con su receta a la farmacia.  - Tambin puede pasar por nuestra oficina durante el horario de atencin regular y recoger una tarjeta de cupones de GoodRx.  - Si necesita que su receta se enve electrnicamente a una farmacia diferente, informe a nuestra oficina a travs de MyChart de [facility] o por telfono llamando al [phone] y presione la opcin 4.  

## 2023-03-13 ENCOUNTER — Other Ambulatory Visit: Payer: Self-pay | Admitting: *Deleted

## 2023-03-13 DIAGNOSIS — K9589 Other complications of other bariatric procedure: Secondary | ICD-10-CM

## 2023-03-13 DIAGNOSIS — R7989 Other specified abnormal findings of blood chemistry: Secondary | ICD-10-CM

## 2023-03-16 ENCOUNTER — Inpatient Hospital Stay: Payer: Medicare PPO | Attending: Internal Medicine

## 2023-03-16 DIAGNOSIS — N200 Calculus of kidney: Secondary | ICD-10-CM | POA: Insufficient documentation

## 2023-03-16 DIAGNOSIS — E538 Deficiency of other specified B group vitamins: Secondary | ICD-10-CM | POA: Insufficient documentation

## 2023-03-16 DIAGNOSIS — I129 Hypertensive chronic kidney disease with stage 1 through stage 4 chronic kidney disease, or unspecified chronic kidney disease: Secondary | ICD-10-CM | POA: Diagnosis not present

## 2023-03-16 DIAGNOSIS — R7989 Other specified abnormal findings of blood chemistry: Secondary | ICD-10-CM

## 2023-03-16 DIAGNOSIS — L930 Discoid lupus erythematosus: Secondary | ICD-10-CM | POA: Insufficient documentation

## 2023-03-16 DIAGNOSIS — D508 Other iron deficiency anemias: Secondary | ICD-10-CM

## 2023-03-16 DIAGNOSIS — D509 Iron deficiency anemia, unspecified: Secondary | ICD-10-CM | POA: Insufficient documentation

## 2023-03-16 DIAGNOSIS — N183 Chronic kidney disease, stage 3 unspecified: Secondary | ICD-10-CM | POA: Insufficient documentation

## 2023-03-16 DIAGNOSIS — K9589 Other complications of other bariatric procedure: Secondary | ICD-10-CM

## 2023-03-16 LAB — IRON AND TIBC
Iron: 63 ug/dL (ref 28–170)
Saturation Ratios: 21 % (ref 10.4–31.8)
TIBC: 302 ug/dL (ref 250–450)
UIBC: 239 ug/dL

## 2023-03-16 LAB — CBC WITH DIFFERENTIAL (CANCER CENTER ONLY)
Abs Immature Granulocytes: 0.08 10*3/uL — ABNORMAL HIGH (ref 0.00–0.07)
Basophils Absolute: 0.1 10*3/uL (ref 0.0–0.1)
Basophils Relative: 1 %
Eosinophils Absolute: 0.1 10*3/uL (ref 0.0–0.5)
Eosinophils Relative: 1 %
HCT: 45.3 % (ref 36.0–46.0)
Hemoglobin: 14.3 g/dL (ref 12.0–15.0)
Immature Granulocytes: 1 %
Lymphocytes Relative: 18 %
Lymphs Abs: 1.7 10*3/uL (ref 0.7–4.0)
MCH: 31.1 pg (ref 26.0–34.0)
MCHC: 31.6 g/dL (ref 30.0–36.0)
MCV: 98.5 fL (ref 80.0–100.0)
Monocytes Absolute: 0.6 10*3/uL (ref 0.1–1.0)
Monocytes Relative: 7 %
Neutro Abs: 6.5 10*3/uL (ref 1.7–7.7)
Neutrophils Relative %: 72 %
Platelet Count: 346 10*3/uL (ref 150–400)
RBC: 4.6 MIL/uL (ref 3.87–5.11)
RDW: 13.2 % (ref 11.5–15.5)
WBC Count: 9.1 10*3/uL (ref 4.0–10.5)
nRBC: 0 % (ref 0.0–0.2)

## 2023-03-16 LAB — BASIC METABOLIC PANEL - CANCER CENTER ONLY
Anion gap: 10 (ref 5–15)
BUN: 21 mg/dL (ref 8–23)
CO2: 25 mmol/L (ref 22–32)
Calcium: 8.8 mg/dL — ABNORMAL LOW (ref 8.9–10.3)
Chloride: 105 mmol/L (ref 98–111)
Creatinine: 1.26 mg/dL — ABNORMAL HIGH (ref 0.44–1.00)
GFR, Estimated: 42 mL/min — ABNORMAL LOW (ref >60)
Glucose, Bld: 98 mg/dL (ref 70–99)
Potassium: 4.8 mmol/L (ref 3.5–5.1)
Sodium: 140 mmol/L (ref 135–145)

## 2023-03-16 LAB — FERRITIN: Ferritin: 58 ng/mL (ref 11–307)

## 2023-03-16 LAB — C-REACTIVE PROTEIN: CRP: 0.6 mg/dL (ref <1.0)

## 2023-03-17 MED FILL — Iron Sucrose Inj 20 MG/ML (Fe Equiv): INTRAVENOUS | Qty: 10 | Status: AC

## 2023-03-18 ENCOUNTER — Encounter: Payer: Self-pay | Admitting: Internal Medicine

## 2023-03-18 ENCOUNTER — Inpatient Hospital Stay: Payer: Medicare PPO | Admitting: Internal Medicine

## 2023-03-18 ENCOUNTER — Inpatient Hospital Stay: Payer: Medicare PPO

## 2023-03-18 VITALS — BP 143/89 | HR 91 | Temp 98.6°F | Resp 18 | Wt 83.0 lb

## 2023-03-18 DIAGNOSIS — K9589 Other complications of other bariatric procedure: Secondary | ICD-10-CM

## 2023-03-18 DIAGNOSIS — D508 Other iron deficiency anemias: Secondary | ICD-10-CM

## 2023-03-18 DIAGNOSIS — D509 Iron deficiency anemia, unspecified: Secondary | ICD-10-CM | POA: Diagnosis not present

## 2023-03-18 NOTE — Progress Notes (Signed)
Not much much appetite, denies nausea. Has gained a couple of pounds. Bruises easily. No visible blood in stool. States she is always tired. Has dyspnea with exertion sometimes.

## 2023-03-18 NOTE — Progress Notes (Signed)
[facility] OFFICE PROGRESS NOTE  Patient Care Team: Jerl Mina, MD as PCP - General (Family Medicine) Earna Coder, MD as Consulting Physician (Hematology and Oncology)   Cancer Staging  No matching staging information was found for the patient.   Oncology History   No history exists.  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 Patient also intolerant of oral iron  -   received parenteral iron therapy with Feraheme 510 mg x 2 doses in May 2014.   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  Ambulating independently  Renee Cabrera 82 y.o.  female pleasant patient above history of Iron deficiency anemia status post partial gastrectomy is here for follow-up.  Denies any nausea vomiting abdominal pain.  No blood in stools or black-colored stools.  Unfortunately unable to gain weight.  Review of Systems  Constitutional:  Positive for malaise/fatigue and weight loss. Negative for chills, diaphoresis and fever.  HENT:  Negative for nosebleeds and sore throat.   Eyes:  Negative for double vision.  Respiratory:  Negative for wheezing.   Cardiovascular:  Negative for chest pain, palpitations, orthopnea and leg swelling.  Gastrointestinal:  Negative for abdominal pain, blood in stool, constipation, diarrhea, heartburn, melena, nausea and vomiting.       Sometimes- dysphagia  Genitourinary:  Negative for dysuria, frequency and urgency.  Musculoskeletal:  Negative for back pain and joint pain.  Skin: Negative.  Negative for itching and rash.  Neurological:  Negative for dizziness, tingling, focal weakness, weakness and headaches.  Endo/Heme/Allergies:  Does not bruise/bleed easily.  Psychiatric/Behavioral:  Negative for depression. The patient is not nervous/anxious and does  not have insomnia.    PAST MEDICAL HISTORY :  Past Medical History:  Diagnosis Date   Alcoholism (HCC)    stopped 1994   Anemia    IRON INFUSIONS   Arthritis    osteoarthritis/ RA   Asthma    remote h/o asthma   Depression    controlled   Dyspnea    GERD (gastroesophageal reflux disease)    H/O hypoglycemia    H/O: GI bleed    Headache(784.0)    h/o migraines   Hearing loss, bilateral    wears hearing aides   History of blood transfusion    pt states she had yellow jaundice   History of kidney stones    Hypertension    H/O NO TX NOW   Lupus (systemic lupus erythematosus) (HCC) 1995   in remission since 2005   Nasal sinus congestion    Osteoporosis with fracture    Peptic ulcer    Pneumonia    Stress fracture of foot    left foot   Vitamin B 12 deficiency     PAST SURGICAL HISTORY :   Past Surgical History:  Procedure Laterality Date   ANTERIOR CERVICAL DECOMP/DISCECTOMY FUSION  2009   BACK SURGERY     CATARACT EXTRACTION W/PHACO Left 09/30/2016   Procedure: CATARACT EXTRACTION PHACO AND INTRAOCULAR LENS PLACEMENT (IOC);  Surgeon: Galen Manila, MD;  Location: [facility] ORS;  Service: Ophthalmology;  Laterality: Left;  Lot# [id number] H [place]: 00:43.6 AP%: 19.6 CDE: 8.55   CATARACT EXTRACTION W/PHACO Right 01/11/2019   Procedure: CATARACT EXTRACTION PHACO AND INTRAOCULAR LENS PLACEMENT (IOC) RIGHT;  Surgeon: Galen Manila, MD;  Location: [facility] ORS;  Service: Ophthalmology;  Laterality: Right;  [place] 01:03.9  CDE 9.19 Fluid pack Lot # [id number]   COLONOSCOPY N/A 04/03/2022   Procedure: COLONOSCOPY;  Surgeon: Jaynie Collins, DO;  Location: [facility] ENDOSCOPY;  Service: Gastroenterology;  Laterality: N/A;   CYSTOSCOPY W/ RETROGRADES  09/02/2022   Procedure: CYSTOSCOPY WITH RETROGRADE PYELOGRAM;  Surgeon: Riki Altes, MD;  Location: [facility] ORS;  Service: Urology;;   CYSTOSCOPY/URETEROSCOPY/HOLMIUM LASER/STENT PLACEMENT Left 09/02/2022   Procedure:  CYSTOSCOPY/URETEROSCOPY/HOLMIUM LASER/STENT PLACEMENT;  Surgeon: Riki Altes, MD;  Location: [facility] ORS;  Service: Urology;  Laterality: Left;   ESOPHAGOGASTRODUODENOSCOPY N/A 04/03/2022   Procedure: ESOPHAGOGASTRODUODENOSCOPY (EGD);  Surgeon: Jaynie Collins, DO;  Location: [facility] ENDOSCOPY;  Service: Gastroenterology;  Laterality: N/A;   INTRAMEDULLARY (IM) NAIL INTERTROCHANTERIC Right 07/01/2016   Procedure: INTRAMEDULLARY (IM) NAIL EXCHANGE OF FEMORAL ROD;  Surgeon: Kennedy Bucker, MD;  Location: [facility] ORS;  Service: Orthopedics;  Laterality: Right;   ORIF FEMUR FRACTURE  2012   PARTIAL GASTRECTOMY  1963   and reconstruction 1973   RECTAL POLYPECTOMY  2023   TONSILLECTOMY  1945    FAMILY HISTORY :   Family History  Problem Relation Age of Onset   Breast cancer Neg Hx     SOCIAL HISTORY:   Social History   Tobacco Use   Smoking status: Never    Passive exposure: Never   Smokeless tobacco: Never  Vaping Use   Vaping Use: Never used  Substance Use Topics   Alcohol use: No   Drug use: No    ALLERGIES:  is allergic to sulfa antibiotics.  MEDICATIONS:  Current Outpatient Medications  Medication Sig Dispense Refill   albuterol (VENTOLIN HFA) 108 (90 Base) MCG/ACT inhaler Inhale into the lungs every 6 (six) hours as needed for wheezing or shortness of breath.     BELSOMRA 10 MG TABS Take 10 mg by mouth at bedtime.   5   Calcium Carb-Cholecalciferol (CALTRATE 600+D3 PO) Take 1 tablet by mouth 2 (two) times daily.     cholecalciferol (VITAMIN D) 1000 UNITS tablet Take 1,000 Units by mouth every evening.      citalopram (CELEXA) 20 MG tablet Take 20 mg by mouth daily at 8 pm. (1900)     cyanocobalamin (,VITAMIN B-12,) 1000 MCG/ML injection Inject 1,000 mcg into the muscle every 30 (thirty) days.      denosumab (PROLIA) 60 MG/ML SOSY injection Inject 60 mg into the skin every 6 (six) months.      esomeprazole (NEXIUM) 40 MG capsule Take 40 mg by mouth 2 (two) times daily  before a meal.     gabapentin (NEURONTIN) 100 MG capsule Take 100-200 mg by mouth See admin instructions. Take 1 capsule (100 mg) by mouth in the morning & take 2 capsules (200 mg) by mouth at night.     HYDROcodone-acetaminophen (NORCO/VICODIN) 5-325 MG tablet Take 1 tablet by mouth every 4 (four) hours as needed (pain.).     mirtazapine (REMERON) 45 MG tablet Take 1 tablet by mouth at bedtime.     Multiple Vitamins-Minerals (PRESERVISION AREDS) TABS Take 1 tablet by mouth 2 (two) times daily.      Travoprost, BAK Free, (TRAVATAN) 0.004 % SOLN ophthalmic solution SMARTSIG:1 Drop(s) In Eye(s) Every Evening     trospium (SANCTURA) 20 MG tablet TAKE 1 TABLET (20 MG TOTAL) BY MOUTH 2 (TWO) TIMES DAILY AS NEEDED (FREQUENCY, URGENCY, BURNING, BLADDER SPASM). 30 tablet 3   venlafaxine XR (EFFEXOR-XR) 37.5 MG 24 hr capsule Take by mouth.     vitamin C (ASCORBIC ACID) 500 MG  tablet Take 500 mg by mouth 2 (two) times daily.     No current facility-administered medications for this visit.    PHYSICAL EXAMINATION: ECOG PERFORMANCE STATUS: 0 - Asymptomatic  BP (!) 143/89 (BP Location: Left Arm)   Pulse 91   Temp 98.6 F (37 C) (Tympanic)   Resp 18   Wt 83 lb (37.6 kg)   SpO2 97%   BMI 16.76 kg/m   Filed Weights   03/18/23 1310  Weight: 83 lb (37.6 kg)    Physical Exam Constitutional:      Comments: Thin built Caucasian female patient.  HENT:     Head: Normocephalic and atraumatic.     Mouth/Throat:     Pharynx: No oropharyngeal exudate.  Eyes:     Pupils: Pupils are equal, round, and reactive to light.  Cardiovascular:     Rate and Rhythm: Normal rate and regular rhythm.  Pulmonary:     Effort: Pulmonary effort is normal. No respiratory distress.     Breath sounds: Normal breath sounds. No wheezing.  Abdominal:     General: Bowel sounds are normal. There is no distension.     Palpations: Abdomen is soft. There is no mass.     Tenderness: There is no abdominal tenderness. There is  no guarding or rebound.  Musculoskeletal:        General: No tenderness. Normal range of motion.     Cervical back: Normal range of motion and neck supple.  Skin:    General: Skin is warm.  Neurological:     Mental Status: She is alert and oriented to person, place, and time.  Psychiatric:        Mood and Affect: Affect normal.      LABORATORY DATA:  I have reviewed the data as listed    Component Value Date/Time   NA 140 03/16/2023 1121   NA 142 02/09/2013 0515   K 4.8 03/16/2023 1121   K 4.0 02/09/2013 0515   CL 105 03/16/2023 1121   CL 113 (H) 02/09/2013 0515   CO2 25 03/16/2023 1121   CO2 25 02/09/2013 0515   GLUCOSE 98 03/16/2023 1121   GLUCOSE 80 02/09/2013 0515   BUN 21 03/16/2023 1121   BUN 20 (H) 02/09/2013 0515   CREATININE 1.26 (H) 03/16/2023 1121   CREATININE 0.58 (L) 02/09/2013 0515   CALCIUM 8.8 (L) 03/16/2023 1121   CALCIUM 7.5 (L) 02/09/2013 0515   PROT 7.2 09/11/2021 1043   PROT 6.8 02/08/2013 1558   ALBUMIN 3.9 09/11/2021 1043   ALBUMIN 3.8 02/08/2013 1558   AST 18 09/11/2021 1043   AST 16 02/08/2013 1558   ALT 11 09/11/2021 1043   ALT 12 02/08/2013 1558   ALKPHOS 52 09/11/2021 1043   ALKPHOS 53 02/08/2013 1558   BILITOT 0.1 (L) 09/11/2021 1043   BILITOT 0.2 02/08/2013 1558   GFRNONAA 42 (L) 03/16/2023 1121   GFRNONAA >60 02/09/2013 0515   GFRAA >60 10/06/2019 0902   GFRAA >60 02/09/2013 0515    No results found for: "SPEP", "UPEP"  Lab Results  Component Value Date   WBC 9.1 03/16/2023   NEUTROABS 6.5 03/16/2023   HGB 14.3 03/16/2023   HCT 45.3 03/16/2023   MCV 98.5 03/16/2023   PLT 346 03/16/2023      Chemistry      Component Value Date/Time   NA 140 03/16/2023 1121   NA 142 02/09/2013 0515   K 4.8 03/16/2023 1121   K 4.0 02/09/2013 0515  CL 105 03/16/2023 1121   CL 113 (H) 02/09/2013 0515   CO2 25 03/16/2023 1121   CO2 25 02/09/2013 0515   BUN 21 03/16/2023 1121   BUN 20 (H) 02/09/2013 0515   CREATININE 1.26 (H)  03/16/2023 1121   CREATININE 0.58 (L) 02/09/2013 0515      Component Value Date/Time   CALCIUM 8.8 (L) 03/16/2023 1121   CALCIUM 7.5 (L) 02/09/2013 0515   ALKPHOS 52 09/11/2021 1043   ALKPHOS 53 02/08/2013 1558   AST 18 09/11/2021 1043   AST 16 02/08/2013 1558   ALT 11 09/11/2021 1043   ALT 12 02/08/2013 1558   BILITOT 0.1 (L) 09/11/2021 1043   BILITOT 0.2 02/08/2013 1558       RADIOGRAPHIC STUDIES: I have personally reviewed the radiological images as listed and agreed with the findings in the report. No results found.   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  [avoid nephrotoxins]- recommend Fluids.   # B12 deficiency secondary to surgery- continue parental B12 with PCP--STABLE.   # weight loss/fatigue: awaiting EGD/coloscopy [Dr.Russow] PET scan [Dr.A]- Negative.  S/p evaluation with [facility].   # Bilateral kidney stones-incidental on imaging.  Awaiting urology evaluation.  # DISPOSITION: # HOLD venofer today # follow up in 6 month- MD-possible Venofer-; labs- 1-2 days prior- cbc/BMP/crp/iron studies/ferritin/ Dr.B  Cc: Dr.Hedrick   Orders Placed This Encounter  Procedures   Basic Metabolic Panel - Cancer Center Only    Standing Status:   Future    Standing Expiration Date:   03/17/2024   CBC with Differential (Cancer Center Only)    Standing Status:   Future    Standing Expiration Date:   03/17/2024   Iron and TIBC    Standing Status:   Future    Standing Expiration Date:   03/17/2024   Ferritin    Standing Status:   Future    Standing Expiration Date:   03/17/2024   All questions were answered. The patient knows to call the clinic with any problems, questions or concerns.      Earna Coder, MD 03/18/2023 1:54 PM

## 2023-03-18 NOTE — Patient Instructions (Signed)
#  Recommend gentle iron [iron biglycinate; 28 mg ] 1 pill a day.  This pill is unlikely to cause stomach upset or cause constipation.  

## 2023-03-18 NOTE — Assessment & Plan Note (Addendum)
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 #Recommend gentle iron [iron biglycinate; 28 mg ] 1 pill a day.  This pill is unlikely to cause stomach upset or cause constipation.   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  [avoid nephrotoxins]- recommend Fluids- stable.   # B12 deficiency secondary to surgery- continue parental B12 with PCP-- stable,   # DISPOSITION: # HOLD venofer today # follow up in 6 month- MD-possible Venofer-; labs- 1-2 days prior- cbc/BMP; B12/crp/iron studies/ferritin/ Dr.B  Cc: Dr.Hedrick

## 2023-09-18 ENCOUNTER — Other Ambulatory Visit: Payer: Self-pay | Admitting: *Deleted

## 2023-09-18 ENCOUNTER — Inpatient Hospital Stay: Payer: Medicare PPO | Attending: Internal Medicine

## 2023-09-18 DIAGNOSIS — N184 Chronic kidney disease, stage 4 (severe): Secondary | ICD-10-CM | POA: Diagnosis not present

## 2023-09-18 DIAGNOSIS — Z9884 Bariatric surgery status: Secondary | ICD-10-CM | POA: Diagnosis not present

## 2023-09-18 DIAGNOSIS — Z87442 Personal history of urinary calculi: Secondary | ICD-10-CM | POA: Insufficient documentation

## 2023-09-18 DIAGNOSIS — D508 Other iron deficiency anemias: Secondary | ICD-10-CM

## 2023-09-18 DIAGNOSIS — L930 Discoid lupus erythematosus: Secondary | ICD-10-CM | POA: Diagnosis not present

## 2023-09-18 DIAGNOSIS — E538 Deficiency of other specified B group vitamins: Secondary | ICD-10-CM | POA: Insufficient documentation

## 2023-09-18 LAB — IRON AND TIBC
Iron: 78 ug/dL (ref 28–170)
Saturation Ratios: 22 % (ref 10.4–31.8)
TIBC: 354 ug/dL (ref 250–450)
UIBC: 276 ug/dL

## 2023-09-18 LAB — CBC WITH DIFFERENTIAL (CANCER CENTER ONLY)
Abs Immature Granulocytes: 0.12 10*3/uL — ABNORMAL HIGH (ref 0.00–0.07)
Basophils Absolute: 0.1 10*3/uL (ref 0.0–0.1)
Basophils Relative: 1 %
Eosinophils Absolute: 0.1 10*3/uL (ref 0.0–0.5)
Eosinophils Relative: 1 %
HCT: 45.6 % (ref 36.0–46.0)
Hemoglobin: 14.5 g/dL (ref 12.0–15.0)
Immature Granulocytes: 1 %
Lymphocytes Relative: 14 %
Lymphs Abs: 1.8 10*3/uL (ref 0.7–4.0)
MCH: 32.2 pg (ref 26.0–34.0)
MCHC: 31.8 g/dL (ref 30.0–36.0)
MCV: 101.1 fL — ABNORMAL HIGH (ref 80.0–100.0)
Monocytes Absolute: 0.7 10*3/uL (ref 0.1–1.0)
Monocytes Relative: 5 %
Neutro Abs: 10.4 10*3/uL — ABNORMAL HIGH (ref 1.7–7.7)
Neutrophils Relative %: 78 %
Platelet Count: 344 10*3/uL (ref 150–400)
RBC: 4.51 MIL/uL (ref 3.87–5.11)
RDW: 13.1 % (ref 11.5–15.5)
WBC Count: 13.2 10*3/uL — ABNORMAL HIGH (ref 4.0–10.5)
nRBC: 0 % (ref 0.0–0.2)

## 2023-09-18 LAB — BASIC METABOLIC PANEL - CANCER CENTER ONLY
Anion gap: 14 (ref 5–15)
BUN: 32 mg/dL — ABNORMAL HIGH (ref 8–23)
CO2: 23 mmol/L (ref 22–32)
Calcium: 9.3 mg/dL (ref 8.9–10.3)
Chloride: 103 mmol/L (ref 98–111)
Creatinine: 1.77 mg/dL — ABNORMAL HIGH (ref 0.44–1.00)
GFR, Estimated: 28 mL/min — ABNORMAL LOW (ref >60)
Glucose, Bld: 87 mg/dL (ref 70–99)
Potassium: 5 mmol/L (ref 3.5–5.1)
Sodium: 140 mmol/L (ref 135–145)

## 2023-09-18 LAB — C-REACTIVE PROTEIN: CRP: 1 mg/dL — ABNORMAL HIGH (ref <1.0)

## 2023-09-18 LAB — FERRITIN: Ferritin: 47 ng/mL (ref 11–307)

## 2023-09-22 ENCOUNTER — Inpatient Hospital Stay: Payer: Medicare PPO

## 2023-09-22 ENCOUNTER — Encounter: Payer: Self-pay | Admitting: Internal Medicine

## 2023-09-22 ENCOUNTER — Inpatient Hospital Stay: Payer: Medicare PPO | Admitting: Internal Medicine

## 2023-09-22 DIAGNOSIS — K9589 Other complications of other bariatric procedure: Secondary | ICD-10-CM | POA: Diagnosis not present

## 2023-09-22 DIAGNOSIS — D508 Other iron deficiency anemias: Secondary | ICD-10-CM | POA: Diagnosis not present

## 2023-09-22 NOTE — Progress Notes (Signed)
[facility] OFFICE PROGRESS NOTE  Patient Care Team: Jerl Mina, MD as PCP - General (Family Medicine) Earna Coder, MD as Consulting Physician (Hematology and Oncology)   Cancer Staging  No matching staging information was found for the patient.    Oncology History   No history exists.  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 Patient also intolerant of oral iron  -   received parenteral iron therapy with Feraheme 510 mg x 2 doses in May 2014.   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  Ambulating independently  Renee Cabrera 82 y.o.  female pleasant patient above history of Iron deficiency anemia status post partial gastrectomy is here for follow-up.  Denies any nausea vomiting abdominal pain.  No blood in stools or black-colored stools.  Unfortunately unable to gain weight.  Review of Systems  Constitutional:  Positive for malaise/fatigue and weight loss. Negative for chills, diaphoresis and fever.  HENT:  Negative for nosebleeds and sore throat.   Eyes:  Negative for double vision.  Respiratory:  Negative for wheezing.   Cardiovascular:  Negative for chest pain, palpitations, orthopnea and leg swelling.  Gastrointestinal:  Negative for abdominal pain, blood in stool, constipation, diarrhea, heartburn, melena, nausea and vomiting.       Sometimes- dysphagia  Genitourinary:  Negative for dysuria, frequency and urgency.  Musculoskeletal:  Negative for back pain and joint pain.  Skin: Negative.  Negative for itching and rash.  Neurological:  Negative for dizziness, tingling, focal weakness, weakness and headaches.  Endo/Heme/Allergies:  Does not bruise/bleed easily.  Psychiatric/Behavioral:  Negative for depression. The patient is not nervous/anxious and does  not have insomnia.    PAST MEDICAL HISTORY :  Past Medical History:  Diagnosis Date   Alcoholism (HCC)    stopped 1994   Anemia    IRON INFUSIONS   Arthritis    osteoarthritis/ RA   Asthma    remote h/o asthma   Depression    controlled   Dyspnea    GERD (gastroesophageal reflux disease)    H/O hypoglycemia    H/O: GI bleed    Headache(784.0)    h/o migraines   Hearing loss, bilateral    wears hearing aides   History of blood transfusion    pt states she had yellow jaundice   History of kidney stones    Hypertension    H/O NO TX NOW   Lupus (systemic lupus erythematosus) (HCC) 1995   in remission since 2005   Nasal sinus congestion    Osteoporosis with fracture    Peptic ulcer    Pneumonia    Stress fracture of foot    left foot   Vitamin B 12 deficiency     PAST SURGICAL HISTORY :   Past Surgical History:  Procedure Laterality Date   ANTERIOR CERVICAL DECOMP/DISCECTOMY FUSION  2009   BACK SURGERY     CATARACT EXTRACTION W/PHACO Left 09/30/2016   Procedure: CATARACT EXTRACTION PHACO AND INTRAOCULAR LENS PLACEMENT (IOC);  Surgeon: Galen Manila, MD;  Location: [facility] ORS;  Service: Ophthalmology;  Laterality: Left;  Lot# [id number] H [place]: 00:43.6 AP%: 19.6 CDE: 8.55   CATARACT EXTRACTION W/PHACO Right 01/11/2019   Procedure: CATARACT EXTRACTION PHACO AND INTRAOCULAR LENS PLACEMENT (IOC) RIGHT;  Surgeon: Galen Manila, MD;  Location: [facility] ORS;  Service: Ophthalmology;  Laterality: Right;  [place]  01:03.9 CDE 9.19 Fluid pack Lot # [id number]   COLONOSCOPY N/A 04/03/2022   Procedure: COLONOSCOPY;  Surgeon: Jaynie Collins, DO;  Location: [facility] ENDOSCOPY;  Service: Gastroenterology;  Laterality: N/A;   CYSTOSCOPY W/ RETROGRADES  09/02/2022   Procedure: CYSTOSCOPY WITH RETROGRADE PYELOGRAM;  Surgeon: Riki Altes, MD;  Location: [facility] ORS;  Service: Urology;;   CYSTOSCOPY/URETEROSCOPY/HOLMIUM LASER/STENT PLACEMENT Left 09/02/2022   Procedure:  CYSTOSCOPY/URETEROSCOPY/HOLMIUM LASER/STENT PLACEMENT;  Surgeon: Riki Altes, MD;  Location: [facility] ORS;  Service: Urology;  Laterality: Left;   ESOPHAGOGASTRODUODENOSCOPY N/A 04/03/2022   Procedure: ESOPHAGOGASTRODUODENOSCOPY (EGD);  Surgeon: Jaynie Collins, DO;  Location: [facility] ENDOSCOPY;  Service: Gastroenterology;  Laterality: N/A;   INTRAMEDULLARY (IM) NAIL INTERTROCHANTERIC Right 07/01/2016   Procedure: INTRAMEDULLARY (IM) NAIL EXCHANGE OF FEMORAL ROD;  Surgeon: Kennedy Bucker, MD;  Location: [facility] ORS;  Service: Orthopedics;  Laterality: Right;   ORIF FEMUR FRACTURE  2012   PARTIAL GASTRECTOMY  1963   and reconstruction 1973   RECTAL POLYPECTOMY  2023   TONSILLECTOMY  1945    FAMILY HISTORY :   Family History  Problem Relation Age of Onset   Breast cancer Neg Hx     SOCIAL HISTORY:   Social History   Tobacco Use   Smoking status: Never    Passive exposure: Never   Smokeless tobacco: Never  Vaping Use   Vaping status: Never Used  Substance Use Topics   Alcohol use: No   Drug use: No    ALLERGIES:  is allergic to sulfa antibiotics.  MEDICATIONS:  Current Outpatient Medications  Medication Sig Dispense Refill   albuterol (VENTOLIN HFA) 108 (90 Base) MCG/ACT inhaler Inhale into the lungs every 6 (six) hours as needed for wheezing or shortness of breath.     azithromycin (ZITHROMAX) 250 MG tablet Take 250 mg by mouth daily. Take every Monday, Wednesday and Friday for 91 days.     BELSOMRA 10 MG TABS Take 10 mg by mouth at bedtime.   5   Calcium Carb-Cholecalciferol (CALTRATE 600+D3 PO) Take 1 tablet by mouth 2 (two) times daily.     cholecalciferol (VITAMIN D) 1000 UNITS tablet Take 1,000 Units by mouth every evening.      esomeprazole (NEXIUM) 40 MG capsule Take 40 mg by mouth 2 (two) times daily before a meal.     gabapentin (NEURONTIN) 100 MG capsule Take 100-200 mg by mouth See admin instructions. Take 1 capsule (100 mg) by mouth in the morning & take 2 capsules  (200 mg) by mouth at night.     HYDROcodone-acetaminophen (NORCO/VICODIN) 5-325 MG tablet Take 1 tablet by mouth every 4 (four) hours as needed (pain.).     megestrol (MEGACE) 40 MG/ML suspension Take 800 mg by mouth daily.     meloxicam (MOBIC) 7.5 MG tablet Take 7.5 mg by mouth daily.     mirtazapine (REMERON) 45 MG tablet Take 1 tablet by mouth at bedtime.     Multiple Vitamins-Minerals (PRESERVISION AREDS) TABS Take 1 tablet by mouth 2 (two) times daily.      naloxone (NARCAN) nasal spray 4 mg/0.1 mL Place 1 spray into the nose.     Naltrexone HCl, Pain, (LOTREXONE) 1.5 MG CAPS Take 2.5 mg by mouth daily.     pantoprazole (PROTONIX) 40 MG tablet Take 40 mg by mouth daily.     Travoprost, BAK Free, (TRAVATAN) 0.004 % SOLN ophthalmic solution SMARTSIG:1 Drop(s) In Eye(s) Every Evening     vitamin C (ASCORBIC ACID) 500 MG tablet  Take 500 mg by mouth 2 (two) times daily.     citalopram (CELEXA) 20 MG tablet Take 20 mg by mouth daily at 8 pm. (1900) (Patient not taking: Reported on 09/22/2023)     cyanocobalamin (,VITAMIN B-12,) 1000 MCG/ML injection Inject 1,000 mcg into the muscle every 30 (thirty) days.  (Patient not taking: Reported on 09/22/2023)     denosumab (PROLIA) 60 MG/ML SOSY injection Inject 60 mg into the skin every 6 (six) months.  (Patient not taking: Reported on 09/22/2023)     trospium (SANCTURA) 20 MG tablet TAKE 1 TABLET (20 MG TOTAL) BY MOUTH 2 (TWO) TIMES DAILY AS NEEDED (FREQUENCY, URGENCY, BURNING, BLADDER SPASM). (Patient not taking: Reported on 09/22/2023) 30 tablet 3   venlafaxine XR (EFFEXOR-XR) 37.5 MG 24 hr capsule Take by mouth.     No current facility-administered medications for this visit.    PHYSICAL EXAMINATION: ECOG PERFORMANCE STATUS: 0 - Asymptomatic  BP 137/77 (BP Location: Left Arm, Patient Position: Sitting, Cuff Size: Normal)   Pulse 79   Temp 98.6 F (37 C) (Oral)   Ht 4\' 11"  (1.499 m)   Wt 85 lb 9.6 oz (38.8 kg)   SpO2 99%   BMI 17.29 kg/m    Filed Weights   09/22/23 1256  Weight: 85 lb 9.6 oz (38.8 kg)     Physical Exam Constitutional:      Comments: Thin built Caucasian female patient.  HENT:     Head: Normocephalic and atraumatic.     Mouth/Throat:     Pharynx: No oropharyngeal exudate.  Eyes:     Pupils: Pupils are equal, round, and reactive to light.  Cardiovascular:     Rate and Rhythm: Normal rate and regular rhythm.  Pulmonary:     Effort: Pulmonary effort is normal. No respiratory distress.     Breath sounds: Normal breath sounds. No wheezing.  Abdominal:     General: Bowel sounds are normal. There is no distension.     Palpations: Abdomen is soft. There is no mass.     Tenderness: There is no abdominal tenderness. There is no guarding or rebound.  Musculoskeletal:        General: No tenderness. Normal range of motion.     Cervical back: Normal range of motion and neck supple.  Skin:    General: Skin is warm.  Neurological:     Mental Status: She is alert and oriented to person, place, and time.  Psychiatric:        Mood and Affect: Affect normal.      LABORATORY DATA:  I have reviewed the data as listed    Component Value Date/Time   NA 140 09/18/2023 1233   NA 142 02/09/2013 0515   K 5.0 09/18/2023 1233   K 4.0 02/09/2013 0515   CL 103 09/18/2023 1233   CL 113 (H) 02/09/2013 0515   CO2 23 09/18/2023 1233   CO2 25 02/09/2013 0515   GLUCOSE 87 09/18/2023 1233   GLUCOSE 80 02/09/2013 0515   BUN 32 (H) 09/18/2023 1233   BUN 20 (H) 02/09/2013 0515   CREATININE 1.77 (H) 09/18/2023 1233   CREATININE 0.58 (L) 02/09/2013 0515   CALCIUM 9.3 09/18/2023 1233   CALCIUM 7.5 (L) 02/09/2013 0515   PROT 7.2 09/11/2021 1043   PROT 6.8 02/08/2013 1558   ALBUMIN 3.9 09/11/2021 1043   ALBUMIN 3.8 02/08/2013 1558   AST 18 09/11/2021 1043   AST 16 02/08/2013 1558   ALT 11 09/11/2021 1043  ALT 12 02/08/2013 1558   ALKPHOS 52 09/11/2021 1043   ALKPHOS 53 02/08/2013 1558   BILITOT 0.1 (L)  09/11/2021 1043   BILITOT 0.2 02/08/2013 1558   GFRNONAA 28 (L) 09/18/2023 1233   GFRNONAA >60 02/09/2013 0515   GFRAA >60 10/06/2019 0902   GFRAA >60 02/09/2013 0515    No results found for: "SPEP", "UPEP"  Lab Results  Component Value Date   WBC 13.2 (H) 09/18/2023   NEUTROABS 10.4 (H) 09/18/2023   HGB 14.5 09/18/2023   HCT 45.6 09/18/2023   MCV 101.1 (H) 09/18/2023   PLT 344 09/18/2023      Chemistry      Component Value Date/Time   NA 140 09/18/2023 1233   NA 142 02/09/2013 0515   K 5.0 09/18/2023 1233   K 4.0 02/09/2013 0515   CL 103 09/18/2023 1233   CL 113 (H) 02/09/2013 0515   CO2 23 09/18/2023 1233   CO2 25 02/09/2013 0515   BUN 32 (H) 09/18/2023 1233   BUN 20 (H) 02/09/2013 0515   CREATININE 1.77 (H) 09/18/2023 1233   CREATININE 0.58 (L) 02/09/2013 0515      Component Value Date/Time   CALCIUM 9.3 09/18/2023 1233   CALCIUM 7.5 (L) 02/09/2013 0515   ALKPHOS 52 09/11/2021 1043   ALKPHOS 53 02/08/2013 1558   AST 18 09/11/2021 1043   AST 16 02/08/2013 1558   ALT 11 09/11/2021 1043   ALT 12 02/08/2013 1558   BILITOT 0.1 (L) 09/11/2021 1043   BILITOT 0.2 02/08/2013 1558       RADIOGRAPHIC STUDIES: I have personally reviewed the radiological images as listed and agreed with the findings in the report. No results found.   ASSESSMENT & PLAN:  Iron deficiency anemia following bariatric surgery # Iron deficient anemia-s/p partial gastrectomy/CKD stage III NOV 2024 hemoglobin stable-14.0 saturation 22%;Ferritin- 47- hold off IV iron. Continue  gentle iron [iron biglycinate; 28 mg ] 1 pill a day.  This pill is unlikely to cause stomach upset or cause constipation. HOLD off venofer today.   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  [avoid nephrotoxins]- GFR 28- recommend follow up with PCP/nephrology.   # B12 deficiency secondary to surgery- continue parental B12 with PCP-- stable,   # DISPOSITION: # HOLD  venofer today # follow up in 6 month- MD-possible Venofer-; labs- 1-2 days prior- cbc/BMP; B12/crp/iron studies/ferritin/ Dr.B  Cc: Dr.Hedrick    Orders Placed This Encounter  Procedures   CBC with Differential (Cancer Center Only)    Standing Status:   Future    Standing Expiration Date:   09/21/2024   Basic metabolic panel    Standing Status:   Future    Standing Expiration Date:   09/21/2024   Vitamin B12    Standing Status:   Future    Standing Expiration Date:   09/21/2024   Iron and TIBC    Standing Status:   Future    Standing Expiration Date:   09/21/2024   Ferritin    Standing Status:   Future    Standing Expiration Date:   09/21/2024   C-reactive protein    Standing Status:   Future    Standing Expiration Date:   09/21/2024   All questions were answered. The patient knows to call the clinic with any problems, questions or concerns.      Earna Coder, MD 09/22/2023 2:15 PM

## 2023-09-22 NOTE — Progress Notes (Signed)
Fatigue/weakness: no Dyspena: no Light headedness: yes from a sitting to stand position Blood in stool: no  C/o back pain 7/10, no injury.  Appetite 25% normal, says its hard to cook for 1. Drinks 1 boost per day.

## 2023-09-22 NOTE — Assessment & Plan Note (Addendum)
#   Iron deficient anemia-s/p partial gastrectomy/CKD stage III NOV 2024 hemoglobin stable-14.0 saturation 22%;Ferritin- 47- hold off IV iron. Continue  gentle iron [iron biglycinate; 28 mg ] 1 pill a day.  This pill is unlikely to cause stomach upset or cause constipation. HOLD off venofer today.   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  [avoid nephrotoxins]- GFR 28- recommend follow up with PCP/nephrology.   # B12 deficiency secondary to surgery- continue parental B12 with PCP-- stable,   # DISPOSITION: # HOLD venofer today # follow up in 6 month- MD-possible Venofer-; labs- 1-2 days prior- cbc/BMP; B12/crp/iron studies/ferritin/ Dr.B  Cc: Dr.Hedrick

## 2023-10-26 ENCOUNTER — Other Ambulatory Visit: Payer: Self-pay | Admitting: Emergency Medicine

## 2023-10-26 DIAGNOSIS — J849 Interstitial pulmonary disease, unspecified: Secondary | ICD-10-CM

## 2023-11-09 ENCOUNTER — Ambulatory Visit
Admission: RE | Admit: 2023-11-09 | Discharge: 2023-11-09 | Disposition: A | Discharge status: Home / Self Care | Payer: Medicare PPO | Source: Ambulatory Visit | Attending: Emergency Medicine | Admitting: Emergency Medicine

## 2023-11-09 DIAGNOSIS — J849 Interstitial pulmonary disease, unspecified: Secondary | ICD-10-CM | POA: Insufficient documentation

## 2023-12-07 ENCOUNTER — Ambulatory Visit: Payer: Medicare PPO | Admitting: Dermatology

## 2023-12-07 DIAGNOSIS — L578 Other skin changes due to chronic exposure to nonionizing radiation: Secondary | ICD-10-CM

## 2023-12-07 DIAGNOSIS — L409 Psoriasis, unspecified: Secondary | ICD-10-CM

## 2023-12-07 DIAGNOSIS — Z1283 Encounter for screening for malignant neoplasm of skin: Secondary | ICD-10-CM | POA: Diagnosis not present

## 2023-12-07 DIAGNOSIS — L814 Other melanin hyperpigmentation: Secondary | ICD-10-CM | POA: Diagnosis not present

## 2023-12-07 DIAGNOSIS — L729 Follicular cyst of the skin and subcutaneous tissue, unspecified: Secondary | ICD-10-CM

## 2023-12-07 DIAGNOSIS — L853 Xerosis cutis: Secondary | ICD-10-CM

## 2023-12-07 DIAGNOSIS — D229 Melanocytic nevi, unspecified: Secondary | ICD-10-CM

## 2023-12-07 DIAGNOSIS — Z7189 Other specified counseling: Secondary | ICD-10-CM

## 2023-12-07 DIAGNOSIS — W908XXA Exposure to other nonionizing radiation, initial encounter: Secondary | ICD-10-CM

## 2023-12-07 DIAGNOSIS — L988 Other specified disorders of the skin and subcutaneous tissue: Secondary | ICD-10-CM

## 2023-12-07 DIAGNOSIS — L821 Other seborrheic keratosis: Secondary | ICD-10-CM

## 2023-12-07 DIAGNOSIS — L905 Scar conditions and fibrosis of skin: Secondary | ICD-10-CM

## 2023-12-07 DIAGNOSIS — D692 Other nonthrombocytopenic purpura: Secondary | ICD-10-CM

## 2023-12-07 DIAGNOSIS — D1801 Hemangioma of skin and subcutaneous tissue: Secondary | ICD-10-CM

## 2023-12-07 NOTE — Progress Notes (Signed)
 Follow-Up Visit   Subjective  Renee Cabrera is a 83 y.o. female who presents for the following: Skin Cancer Screening and Full Body Skin Exam Hx of scalp psoriasis, has used taclonex solution as needed for flares. Not currently flared.    The patient presents for Total-Body Skin Exam (TBSE) for skin cancer screening and mole check. The patient has spots, moles and lesions to be evaluated, some may be new or changing and the patient may have concern these could be cancer.    The following portions of the chart were reviewed this encounter and updated as appropriate: medications, allergies, medical history  Review of Systems:  No other skin or systemic complaints except as noted in HPI or Assessment and Plan.  Objective  Well appearing patient in no apparent distress; mood and affect are within normal limits.  A full examination was performed including scalp, head, eyes, ears, nose, lips, neck, chest, axillae, abdomen, back, buttocks, bilateral upper extremities, bilateral lower extremities, hands, feet, fingers, toes, fingernails, and toenails. All findings within normal limits unless otherwise noted below.   Relevant physical exam findings are noted in the Assessment and Plan.    Assessment & Plan   SKIN CANCER SCREENING PERFORMED TODAY.  ACTINIC DAMAGE - Chronic condition, secondary to cumulative UV/sun exposure - diffuse scaly erythematous macules with underlying dyspigmentation - Recommend daily broad spectrum sunscreen SPF 30+ to sun-exposed areas, reapply every 2 hours as needed.  - Staying in the shade or wearing long sleeves, sun glasses (UVA+UVB protection) and wide brim hats (4-inch brim around the entire circumference of the hat) are also recommended for sun protection.  - Call for new or changing lesions.  LENTIGINES, SEBORRHEIC KERATOSES, HEMANGIOMAS - Benign normal skin lesions - Sk at left cheek - Benign-appearing - Call for any changes   MELANOCYTIC  NEVI - Tan-brown and/or pink-flesh-colored symmetric macules and papules - Benign appearing on exam today - Observation - Call clinic for new or changing moles - Recommend daily use of broad spectrum spf 30+ sunscreen to sun-exposed areas.    Purpura - Chronic; persistent and recurrent.  Treatable, but not curable. At arms and legs - Violaceous macules and patches - Benign - Related to trauma, age, sun damage and/or use of blood thinners, chronic use of topical and/or oral steroids - Observe - Can use OTC arnica containing moisturizer such as Dermend Bruise Formula if desired - Call for worsening or other concerns  Psoriasis Exam: clear at scalp today including occipital scalp    Chronic condition with duration or expected duration over one year. Currently well-controlled.  Not needing treatment at this time.   Counseling on psoriasis and coordination of care  psoriasis is a chronic non-curable, but treatable genetic/hereditary disease that may have other systemic features affecting other organ systems such as joints (Psoriatic Arthritis). It is associated with an increased risk of inflammatory bowel disease, heart disease, non-alcoholic fatty liver disease, and depression.  Treatments include light and laser treatments; topical medications; and systemic medications including oral and injectables.    Cont Taclonex solution qd prn flares scalp   Topical steroids (such as triamcinolone , fluocinolone, fluocinonide, mometasone, clobetasol, halobetasol, betamethasone, hydrocortisone) can cause thinning and lightening of the skin if they are used for too long in the same area. Your physician has selected the right strength medicine for your problem and area affected on the body. Please use your medication only as directed by your physician to prevent side effects.    PSEUDOCYST from  old hematoma Exam: subcutaneous nodule at 4 x 3 cm at right forearm below elbow  Treatment Plan: Seen by Dr.  Warden Ha of ortho on 04/20/23 He recommended observation since it is asymptomatic and difficult to remove  SCAR Exam: Dyspigmented smooth macule or patch right upper arm.  Benign-appearing.  Observation.  Call clinic for new or changing lesions. Recommend daily broad spectrum sunscreen SPF 30+, reapply every 2 hours as needed.   Xerosis - diffuse xerotic patches - recommend gentle, hydrating skin care - gentle skin care handout given   Return in about 1 year (around 12/06/2024) for TBSE.  I, Randee Busing, CMA, am acting as scribe for Artemio Larry, MD.   Documentation: I have reviewed the above documentation for accuracy and completeness, and I agree with the above.  Artemio Larry, MD

## 2023-12-07 NOTE — Patient Instructions (Addendum)
 Gentle Skin Care Guide  1. Bathe no more than once a day.  2. Avoid bathing in hot water  3. Use a mild soap like Dove, Vanicream, Cetaphil, CeraVe. Can use Lever 2000 or Cetaphil antibacterial soap  4. Use soap only where you need it. On most days, use it under your arms, between your legs, and on your feet. Let the water rinse other areas unless visibly dirty.  5. When you get out of the bath/shower, use a towel to gently blot your skin dry, don't rub it.  6. While your skin is still a little damp, apply a moisturizing cream such as Vanicream, CeraVe, Cetaphil, Eucerin, Sarna lotion or plain Vaseline Jelly. For hands apply Neutrogena [place] Hand Cream or Excipial Hand Cream.  7. Reapply moisturizer any time you start to itch or feel dry.  8. Sometimes using free and clear laundry detergents can be helpful. Fabric softener sheets should be avoided. Downy Free & Gentle liquid, or any liquid fabric softener that is free of dyes and perfumes, it acceptable to use  9. If your doctor has given you prescription creams you may apply moisturizers over them    Seborrheic Keratosis  What causes seborrheic keratoses? Seborrheic keratoses are harmless, common skin growths that first appear during adult life.  As time goes by, more growths appear.  Some people may develop a large number of them.  Seborrheic keratoses appear on both covered and uncovered body parts.  They are not caused by sunlight.  The tendency to develop seborrheic keratoses can be inherited.  They vary in color from skin-colored to gray, brown, or even black.  They can be either smooth or have a rough, warty surface.   Seborrheic keratoses are superficial and look as if they were stuck on the skin.  Under the microscope this type of keratosis looks like layers upon layers of skin.  That is why at times the top layer may seem to fall off, but the rest of the growth remains and re-grows.    Treatment Seborrheic keratoses do not  need to be treated, but can easily be removed in the office.  Seborrheic keratoses often cause symptoms when they rub on clothing or jewelry.  Lesions can be in the way of shaving.  If they become inflamed, they can cause itching, soreness, or burning.  Removal of a seborrheic keratosis can be accomplished by freezing, burning, or surgery. If any spot bleeds, scabs, or grows rapidly, please return to have it checked, as these can be an indication of a skin cancer.   Melanoma ABCDEs  Melanoma is the most dangerous type of skin cancer, and is the leading cause of death from skin disease.  You are more likely to develop melanoma if you: Have light-colored skin, light-colored eyes, or red or blond hair Spend a lot of time in the sun Tan regularly, either outdoors or in a tanning bed Have had blistering sunburns, especially during childhood Have a close family member who has had a melanoma Have atypical moles or large birthmarks  Early detection of melanoma is key since treatment is typically straightforward and cure rates are extremely high if we catch it early.   The first sign of melanoma is often a change in a mole or a new dark spot.  The ABCDE system is a way of remembering the signs of melanoma.  A for asymmetry:  The two halves do not match. B for border:  The edges of the growth are irregular.  C for color:  A mixture of colors are present instead of an even brown color. D for diameter:  Melanomas are usually (but not always) greater than 6mm - the size of a pencil eraser. E for evolution:  The spot keeps changing in size, shape, and color.  Please check your skin once per month between visits. You can use a small mirror in front and a large mirror behind you to keep an eye on the back side or your body.   If you see any new or changing lesions before your next follow-up, please call to schedule a visit.  Please continue daily skin protection including broad spectrum sunscreen SPF 30+  to sun-exposed areas, reapplying every 2 hours as needed when you're outdoors.   Staying in the shade or wearing long sleeves, sun glasses (UVA+UVB protection) and wide brim hats (4-inch brim around the entire circumference of the hat) are also recommended for sun protection.     Due to recent changes in healthcare laws, you may see results of your pathology and/or laboratory studies on MyChart before the doctors have had a chance to review them. We understand that in some cases there may be results that are confusing or concerning to you. Please understand that not all results are received at the same time and often the doctors may need to interpret multiple results in order to provide you with the best plan of care or course of treatment. Therefore, we ask that you please give us  2 business days to thoroughly review all your results before contacting the office for clarification. Should we see a critical lab result, you will be contacted sooner.   If You Need Anything After Your Visit  If you have any questions or concerns for your doctor, please call our main line at [phone] and press option 4 to reach your doctor's medical assistant. If no one answers, please leave a voicemail as directed and we will return your call as soon as possible. Messages left after 4 pm will be answered the following business day.   You may also send us  a message via MyChart. We typically respond to MyChart messages within 1-2 business days.  For prescription refills, please ask your pharmacy to contact our office. Our fax number is [phone].  If you have an urgent issue when the clinic is closed that cannot wait until the next business day, you can page your doctor at the number below.    Please note that while we do our best to be available for urgent issues outside of office hours, we are not available 24/7.   If you have an urgent issue and are unable to reach us , you may choose to seek medical care at  your doctor's office, retail clinic, urgent care center, or emergency room.  If you have a medical emergency, please immediately call 911 or go to the emergency department.  Pager Numbers  - Dr. Bary Likes: [phone]  - Dr. Annette Barters: [phone]  - Dr. Felipe Horton: [phone]   In the event of inclement weather, please call our main line at [phone] for an update on the status of any delays or closures.  Dermatology Medication Tips: Please keep the boxes that topical medications come in in order to help keep track of the instructions about where and how to use these. Pharmacies typically print the medication instructions only on the boxes and not directly on the medication tubes.   If your medication is too expensive, please contact our office at  [phone] option 4 or send us  a message through MyChart.   We are unable to tell what your co-pay for medications will be in advance as this is different depending on your insurance coverage. However, we may be able to find a substitute medication at lower cost or fill out paperwork to get insurance to cover a needed medication.   If a prior authorization is required to get your medication covered by your insurance company, please allow us  1-2 business days to complete this process.  Drug prices often vary depending on where the prescription is filled and some pharmacies may offer cheaper prices.  The website www.goodrx.com contains coupons for medications through different pharmacies. The prices here do not account for what the cost may be with help from insurance (it may be cheaper with your insurance), but the website can give you the price if you did not use any insurance.  - You can print the associated coupon and take it with your prescription to the pharmacy.  - You may also stop by our office during regular business hours and pick up a GoodRx coupon card.  - If you need your prescription sent electronically to a different pharmacy,  notify our office through [facility] or by phone at [phone] option 4.     Si Usted Necesita Algo Despus de Su Visita  Tambin puede enviarnos un mensaje a travs de Clinical cytogeneticist. Por lo general respondemos a los mensajes de MyChart en el transcurso de 1 a 2 das hbiles.  Para renovar recetas, por favor pida a su farmacia que se ponga en contacto con nuestra oficina. Franz Jacks de fax es [place] [phone].  Si tiene un asunto urgente cuando la clnica est cerrada y que no puede esperar hasta el siguiente da hbil, puede llamar/localizar a su doctor(a) al nmero que aparece a continuacin.   Por favor, tenga en cuenta que aunque hacemos todo lo posible para estar disponibles para asuntos urgentes fuera del horario de [place], no estamos disponibles las 24 horas del da, los 7 [address]  [address] de la [place].   Si tiene un problema urgente y no puede comunicarse con nosotros, puede optar por buscar atencin mdica  en el consultorio de su doctor(a), en una clnica privada, en un centro de atencin urgente o en una sala de emergencias.  Si tiene Engineer, drilling, por favor llame inmediatamente al 911 o vaya a la sala de emergencias.  Nmeros de bper  - Dr. Bary Likes: [phone]  - Dra. Annette Barters: [phone]  - Dr. Felipe Horton: [phone]   En caso de inclemencias del tiempo, por favor llame a Lajuan Pila principal al [phone] para una actualizacin sobre el [place] de cualquier retraso o cierre.  Consejos para la medicacin en dermatologa: Por favor, guarde las cajas en las que vienen los medicamentos de uso tpico para ayudarle a seguir las instrucciones sobre dnde y cmo usarlos. Las farmacias generalmente imprimen las instrucciones del medicamento slo en las cajas y no directamente en los tubos del [place].   Si su medicamento es muy caro, por favor, pngase en contacto con Bettyjane Brunet llamando al [phone] y presione la opcin 4 o envenos un mensaje a travs de  Clinical cytogeneticist.   No podemos decirle cul ser su copago por los medicamentos por adelantado ya que esto es diferente dependiendo de la cobertura de su seguro. Sin embargo, es posible que podamos encontrar un medicamento sustituto a Audiological scientist un formulario para que el seguro cubra el medicamento que se  considera necesario.   Si se requiere una autorizacin previa para que su compaa de seguros [place] su medicamento, por favor permtanos de 1 a 2 das hbiles para completar este proceso.  Los precios de los medicamentos varan con frecuencia dependiendo del Environmental consultant de dnde se surte la receta y alguna farmacias pueden ofrecer precios ms baratos.  El sitio web www.goodrx.com tiene cupones para medicamentos de Health and safety inspector. Los precios aqu no tienen en cuenta lo que podra costar con la ayuda del seguro (puede ser ms barato con su seguro), pero el sitio web puede darle el precio si no utiliz Tourist information centre manager.  - Puede imprimir el cupn correspondiente y llevarlo con su receta a la farmacia.  - Tambin puede pasar por nuestra oficina durante el horario de atencin regular y Education officer, museum una tarjeta de cupones de GoodRx.  - Si necesita que su receta se enve electrnicamente a una farmacia diferente, informe a nuestra oficina a travs de MyChart de [place] o por telfono llamando al [phone] y presione la opcin 4.

## 2023-12-25 ENCOUNTER — Ambulatory Visit
Admission: RE | Admit: 2023-12-25 | Discharge: 2023-12-25 | Disposition: A | Discharge status: Home / Self Care | Payer: Medicare PPO | Source: Ambulatory Visit | Attending: Urology | Admitting: Urology

## 2023-12-25 ENCOUNTER — Ambulatory Visit: Payer: Medicare PPO | Admitting: Urology

## 2023-12-25 VITALS — BP 147/83 | HR 76 | Ht 59.0 in | Wt 85.0 lb

## 2023-12-25 DIAGNOSIS — N200 Calculus of kidney: Secondary | ICD-10-CM | POA: Diagnosis present

## 2023-12-25 NOTE — Progress Notes (Signed)
 I, Maysun Anabel Bene, acting as a scribe for Riki Altes, MD., have documented all relevant documentation on the behalf of Riki Altes, MD, as directed by Riki Altes, MD while in the presence of Riki Altes, MD.  12/25/2023 11:36 AM   [name] [birth year] [id number]  Referring provider: Jerl Mina, MD [address] [facility] [place],  [place] [address]  Chief Complaint  Patient presents with   Follow-up   Urologic history: 1.  Nephrolithiasis Bilateral nonobstructing renal calculi Ureteroscopic removal of a left 10 mm renal pelvic calculus and 8 mm infundibular calculus 09/02/2022  HPI: Renee Cabrera is a 83 y.o. female presents for annual follow-up.   Doing well since last visit No bothersome LUTS Denies dysuria, gross hematuria Denies flank, abdominal or pelvic pain   PMH: Past Medical History:  Diagnosis Date   Alcoholism (HCC)    stopped 1994   Anemia    IRON INFUSIONS   Arthritis    osteoarthritis/ RA   Asthma    remote h/o asthma   Depression    controlled   Dyspnea    GERD (gastroesophageal reflux disease)    H/O hypoglycemia    H/O: GI bleed    Headache(784.0)    h/o migraines   Hearing loss, bilateral    wears hearing aides   History of blood transfusion    pt states she had yellow jaundice   History of kidney stones    Hypertension    H/O NO TX NOW   Lupus (systemic lupus erythematosus) (HCC) 1995   in remission since 2005   Nasal sinus congestion    Osteoporosis with fracture    Peptic ulcer    Pneumonia    Stress fracture of foot    left foot   Vitamin B 12 deficiency     Surgical History: Past Surgical History:  Procedure Laterality Date   ANTERIOR CERVICAL DECOMP/DISCECTOMY FUSION  2009   BACK SURGERY     CATARACT EXTRACTION W/PHACO Left 09/30/2016   Procedure: CATARACT EXTRACTION PHACO AND INTRAOCULAR LENS PLACEMENT (IOC);  Surgeon: Galen Manila, MD;  Location: [facility] ORS;  Service:  Ophthalmology;  Laterality: Left;  Lot# [id number] H [place]: 00:43.6 AP%: 19.6 CDE: 8.55   CATARACT EXTRACTION W/PHACO Right 01/11/2019   Procedure: CATARACT EXTRACTION PHACO AND INTRAOCULAR LENS PLACEMENT (IOC) RIGHT;  Surgeon: Galen Manila, MD;  Location: [facility] ORS;  Service: Ophthalmology;  Laterality: Right;  [place] 01:03.9 CDE 9.19 Fluid pack Lot # [id number]   COLONOSCOPY N/A 04/03/2022   Procedure: COLONOSCOPY;  Surgeon: Jaynie Collins, DO;  Location: [facility] ENDOSCOPY;  Service: Gastroenterology;  Laterality: N/A;   CYSTOSCOPY W/ RETROGRADES  09/02/2022   Procedure: CYSTOSCOPY WITH RETROGRADE PYELOGRAM;  Surgeon: Riki Altes, MD;  Location: [facility] ORS;  Service: Urology;;   CYSTOSCOPY/URETEROSCOPY/HOLMIUM LASER/STENT PLACEMENT Left 09/02/2022   Procedure: CYSTOSCOPY/URETEROSCOPY/HOLMIUM LASER/STENT PLACEMENT;  Surgeon: Riki Altes, MD;  Location: [facility] OR[facility];  Service: Urology;  Laterality: Left;   ESOPHAGOGASTRODUODENOSCOPY N/A 04/03/2022   Procedure: ESOPHAGOGASTRODUODENOSCOPY (EGD);  Surgeon: Jaynie Collins, DO;  Location[facility];  Service: Gastroenterology;  Laterality: N/A;   INTRAMEDULLARY (IM) NAIL INTERTROCHANTERIC Right 07/01/2016   Procedure: INTRAMEDULLARY (IM) NAIL EXCHANGE OF FEMORAL ROD;  Surgeon: Kennedy Bucker, MD;  Location: [facility] ORS;  Service: Orthopedics;  Laterality: Right;   ORIF FEMUR FRACTURE  2012   PARTIAL GASTRECTOMY  1963   and reconstruction 1973   RECTAL POLYPECTOMY  2023   TONSILLECTOMY  1945    Home Medications:  Allergies as of 12/25/2023       Reactions   Sulfa Antibiotics Rash        Medication List        Accurate as of December 25, 2023 11:36 AM. If you have any questions, ask your nurse or doctor.          albuterol 108 (90 Base) MCG/ACT inhaler Commonly known as: VENTOLIN HFA Inhale into the lungs every 6 (six) hours as needed for wheezing or shortness of breath.   ascorbic acid 500 MG tablet Commonly known as:  VITAMIN C Take 500 mg by mouth 2 (two) times daily.   azithromycin 250 MG tablet Commonly known as: ZITHROMAX Take 250 mg by mouth daily. Take every Monday, Wednesday and Friday for 91 days.   Belsomra 10 MG Tabs Generic drug: Suvorexant Take 10 mg by mouth at bedtime.   CALTRATE 600+D3 PO Take 1 tablet by mouth 2 (two) times daily.   cholecalciferol 1000 units tablet Commonly known as: VITAMIN D Take 1,000 Units by mouth every evening.   citalopram 20 MG tablet Commonly known as: CELEXA Take 20 mg by mouth daily at 8 pm. (1900)   cyanocobalamin 1000 MCG/ML injection Commonly known as: VITAMIN B12 Inject 1,000 mcg into the muscle every 30 (thirty) days.   denosumab 60 MG/ML Sosy injection Commonly known as: PROLIA Inject 60 mg into the skin every 6 (six) months.   esomeprazole 40 MG capsule Commonly known as: NEXIUM Take 40 mg by mouth 2 (two) times daily before a meal.   gabapentin 100 MG capsule Commonly known as: NEURONTIN Take 100-200 mg by mouth See admin instructions. Take 1 capsule (100 mg) by mouth in the morning & take 2 capsules (200 mg) by mouth at night.   HYDROcodone-acetaminophen 5-325 MG tablet Commonly known as: NORCO/VICODIN Take 1 tablet by mouth every 4 (four) hours as needed (pain.).   losartan 25 MG tablet Commonly known as: COZAAR Take 25 mg by mouth daily.   Lotrexone 1.5 MG Caps Generic drug: Naltrexone HCl (Pain) Take 2.5 mg by mouth daily.   megestrol 40 MG/ML suspension Commonly known as: MEGACE Take 800 mg by mouth daily.   meloxicam 7.5 MG tablet Commonly known as: MOBIC Take 7.5 mg by mouth daily.   mirtazapine 45 MG tablet Commonly known as: REMERON Take 1 tablet by mouth at bedtime.   Narcan 4 MG/0.1ML Liqd nasal spray kit Generic drug: naloxone Place 1 spray into the nose.   pantoprazole 40 MG tablet Commonly known as: PROTONIX Take 40 mg by mouth daily.   PreserVision AREDS Tabs Take 1 tablet by mouth 2 (two)  times daily.   Travoprost (BAK Free) 0.004 % Soln ophthalmic solution Commonly known as: TRAVATAN SMARTSIG:1 Drop(s) In Eye(s) Every Evening   trospium 20 MG tablet Commonly known as: SANCTURA TAKE 1 TABLET (20 MG TOTAL) BY MOUTH 2 (TWO) TIMES DAILY AS NEEDED (FREQUENCY, URGENCY, BURNING, BLADDER SPASM).   venlafaxine XR 37.5 MG 24 hr capsule Commonly known as: EFFEXOR-XR Take by mouth.        Allergies:  Allergies  Allergen Reactions   Sulfa Antibiotics Rash    Family History: Family History  Problem Relation Age of Onset   Breast cancer Neg Hx     Social History:  reports that she has never smoked. She has never been exposed to tobacco smoke. She has never used smokeless tobacco. She reports that she does not drink alcohol  and does not use drugs.   Physical Exam: BP (!) 147/83   Pulse 76   Ht 4\' 11"  (1.499 m)   Wt 85 lb (38.6 kg)   BMI 17.17 kg/m   Constitutional:  Alert and oriented, No acute distress. HEENT: [place] AT Respiratory: Normal respiratory effort, no increased work of breathing. Psychiatric: Normal mood and affect.  Pertinent Imaging: KUB performed earlier today was personally reviewed and interpreted. There are calcifications overlying the tright renal outline.  A CT chest performed January 2025 was also reviewed. The left kidney is seen in its entirety, and there are small lower pole calcifications. The right kidney, there are small calcifications in the right upper pole.  CT  EXAM: CT CHEST WITHOUT CONTRAST   TECHNIQUE: Multidetector CT imaging of the chest was performed following the standard protocol without IV contrast.   RADIATION DOSE REDUCTION: This exam was performed according to the departmental dose-optimization program which includes automated exposure control, adjustment of the mA and/or kV according to patient size and/or use of iterative reconstruction technique.   COMPARISON:  CT 07/17/2021   FINDINGS: Cardiovascular: Normal  heart size. No pericardial effusion. Coronary artery and aortic atherosclerotic calcification.   Mediastinum/Nodes: Postoperative change about the gastroesophageal junction. Esophagus and trachea are unremarkable. Calcified right hilar and mediastinal lymph nodes. No thoracic adenopathy.   Lungs/Pleura: No focal consolidation, pleural effusion, or pneumothorax. Stable lingular scarring. Biapical pleural-parenchymal scarring. Calcified granuloma in the right lower lung. No evidence of interstitial lung disease.   Upper Abdomen: No acute abnormality.   Musculoskeletal: No acute fracture.   IMPRESSION: No acute abnormality.  Stable lingular scarring.   Aortic Atherosclerosis (ICD10-I70.0).     Electronically Signed   By: Minerva Fester M.D.   On: 11/15/2023 23:04  Assessment & Plan:    1. Nephrolithiasis Non-obstructing renal calculi 1 year follow-up with KUB; instructed to call earlier for flank/abdominal pain  I have reviewed the above documentation for accuracy and completeness, and I agree with the above.   Riki Altes, MD  [facility] [address], [address] [place], [place] [address] [phone]

## 2023-12-26 ENCOUNTER — Encounter: Payer: Self-pay | Admitting: Urology

## 2024-02-08 ENCOUNTER — Other Ambulatory Visit: Payer: Self-pay | Admitting: Family Medicine

## 2024-02-08 DIAGNOSIS — Z1231 Encounter for screening mammogram for malignant neoplasm of breast: Secondary | ICD-10-CM

## 2024-02-09 ENCOUNTER — Encounter: Payer: Self-pay | Admitting: Internal Medicine

## 2024-02-09 ENCOUNTER — Other Ambulatory Visit: Payer: Self-pay | Admitting: Family Medicine

## 2024-02-09 DIAGNOSIS — M5416 Radiculopathy, lumbar region: Secondary | ICD-10-CM

## 2024-02-10 ENCOUNTER — Ambulatory Visit
Admission: RE | Admit: 2024-02-10 | Discharge: 2024-02-10 | Disposition: A | Discharge status: Home / Self Care | Source: Ambulatory Visit | Attending: Family Medicine | Admitting: Family Medicine

## 2024-02-10 DIAGNOSIS — M5416 Radiculopathy, lumbar region: Secondary | ICD-10-CM

## 2024-02-17 ENCOUNTER — Ambulatory Visit
Admission: RE | Admit: 2024-02-17 | Discharge: 2024-02-17 | Disposition: A | Discharge status: Home / Self Care | Source: Ambulatory Visit | Attending: Family Medicine | Admitting: Family Medicine

## 2024-02-17 DIAGNOSIS — Z1231 Encounter for screening mammogram for malignant neoplasm of breast: Secondary | ICD-10-CM | POA: Insufficient documentation

## 2024-03-25 ENCOUNTER — Inpatient Hospital Stay: Payer: Medicare PPO | Attending: Internal Medicine

## 2024-03-25 DIAGNOSIS — D508 Other iron deficiency anemias: Secondary | ICD-10-CM | POA: Diagnosis present

## 2024-03-25 DIAGNOSIS — N184 Chronic kidney disease, stage 4 (severe): Secondary | ICD-10-CM | POA: Diagnosis not present

## 2024-03-25 DIAGNOSIS — Z9884 Bariatric surgery status: Secondary | ICD-10-CM | POA: Diagnosis not present

## 2024-03-25 DIAGNOSIS — E538 Deficiency of other specified B group vitamins: Secondary | ICD-10-CM | POA: Diagnosis not present

## 2024-03-25 DIAGNOSIS — K9589 Other complications of other bariatric procedure: Secondary | ICD-10-CM

## 2024-03-25 LAB — CBC WITH DIFFERENTIAL (CANCER CENTER ONLY)
Abs Immature Granulocytes: 0.07 10*3/uL (ref 0.00–0.07)
Basophils Absolute: 0.1 10*3/uL (ref 0.0–0.1)
Basophils Relative: 1 %
Eosinophils Absolute: 0.1 10*3/uL (ref 0.0–0.5)
Eosinophils Relative: 1 %
HCT: 41.4 % (ref 36.0–46.0)
Hemoglobin: 13.2 g/dL (ref 12.0–15.0)
Immature Granulocytes: 1 %
Lymphocytes Relative: 19 %
Lymphs Abs: 1.8 10*3/uL (ref 0.7–4.0)
MCH: 32.6 pg (ref 26.0–34.0)
MCHC: 31.9 g/dL (ref 30.0–36.0)
MCV: 102.2 fL — ABNORMAL HIGH (ref 80.0–100.0)
Monocytes Absolute: 0.8 10*3/uL (ref 0.1–1.0)
Monocytes Relative: 8 %
Neutro Abs: 6.7 10*3/uL (ref 1.7–7.7)
Neutrophils Relative %: 70 %
Platelet Count: 290 10*3/uL (ref 150–400)
RBC: 4.05 MIL/uL (ref 3.87–5.11)
RDW: 13.4 % (ref 11.5–15.5)
WBC Count: 9.5 10*3/uL (ref 4.0–10.5)
nRBC: 0 % (ref 0.0–0.2)

## 2024-03-25 LAB — BASIC METABOLIC PANEL WITH GFR
Anion gap: 8 (ref 5–15)
BUN: 27 mg/dL — ABNORMAL HIGH (ref 8–23)
CO2: 21 mmol/L — ABNORMAL LOW (ref 22–32)
Calcium: 8.2 mg/dL — ABNORMAL LOW (ref 8.9–10.3)
Chloride: 111 mmol/L (ref 98–111)
Creatinine, Ser: 1.2 mg/dL — ABNORMAL HIGH (ref 0.44–1.00)
GFR, Estimated: 45 mL/min — ABNORMAL LOW (ref >60)
Glucose, Bld: 86 mg/dL (ref 70–99)
Potassium: 4 mmol/L (ref 3.5–5.1)
Sodium: 140 mmol/L (ref 135–145)

## 2024-03-25 LAB — IRON AND TIBC
Iron: 77 ug/dL (ref 28–170)
Saturation Ratios: 22 % (ref 10.4–31.8)
TIBC: 343 ug/dL (ref 250–450)
UIBC: 266 ug/dL

## 2024-03-25 LAB — C-REACTIVE PROTEIN: CRP: 0.7 mg/dL (ref <1.0)

## 2024-03-25 LAB — FERRITIN: Ferritin: 22 ng/mL (ref 11–307)

## 2024-03-25 LAB — VITAMIN B12: Vitamin B-12: 495 pg/mL (ref 180–914)

## 2024-03-29 ENCOUNTER — Inpatient Hospital Stay: Payer: Medicare PPO

## 2024-03-29 ENCOUNTER — Inpatient Hospital Stay: Payer: Medicare PPO | Attending: Internal Medicine | Admitting: Internal Medicine

## 2024-03-29 ENCOUNTER — Encounter: Payer: Self-pay | Admitting: Internal Medicine

## 2024-03-29 VITALS — BP 120/77 | HR 97 | Temp 97.9°F | Resp 16 | Ht 59.0 in | Wt 91.3 lb

## 2024-03-29 DIAGNOSIS — N184 Chronic kidney disease, stage 4 (severe): Secondary | ICD-10-CM | POA: Insufficient documentation

## 2024-03-29 DIAGNOSIS — Z87442 Personal history of urinary calculi: Secondary | ICD-10-CM | POA: Insufficient documentation

## 2024-03-29 DIAGNOSIS — K9589 Other complications of other bariatric procedure: Secondary | ICD-10-CM

## 2024-03-29 DIAGNOSIS — D508 Other iron deficiency anemias: Secondary | ICD-10-CM | POA: Insufficient documentation

## 2024-03-29 DIAGNOSIS — E538 Deficiency of other specified B group vitamins: Secondary | ICD-10-CM | POA: Diagnosis not present

## 2024-03-29 DIAGNOSIS — Z9884 Bariatric surgery status: Secondary | ICD-10-CM | POA: Diagnosis not present

## 2024-03-29 NOTE — Patient Instructions (Signed)
#  Recommend gentle iron [iron biglycinate; 28 mg ] 1 pill a day.  This pill is unlikely to cause stomach upset or cause constipation.

## 2024-03-29 NOTE — Progress Notes (Signed)
Fatigue/weakness: Dyspena: Light headedness: Blood in stool:

## 2024-03-29 NOTE — Progress Notes (Signed)
 [facility] OFFICE PROGRESS NOTE  Patient Care Team: Lyle San, MD as PCP - General (Family Medicine) Gwyn Leos, MD as Consulting Physician (Hematology and Oncology)   Cancer Staging  No matching staging information was found for the patient.    Oncology History   No history exists.  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 Patient also intolerant of oral iron   -   received parenteral iron  therapy with Feraheme 510 mg x 2 doses in May 2014.   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  Ambulating independently  Renee Cabrera 83 y.o.  female pleasant patient above history of Iron  deficiency anemia status post partial gastrectomy [PUD]/CKD is here for follow-up.  Patient is finally gaining some of her weight back, her appetite is better. She is getting a shot in her back next week to help with her back pain.   Denies any nausea vomiting abdominal pain.  No blood in stools or black-colored stools.  Patient has been off iron  pills for the last few months.  Review of Systems  Constitutional:  Positive for malaise/fatigue. Negative for chills, diaphoresis and fever.  HENT:  Negative for nosebleeds and sore throat.   Eyes:  Negative for double vision.  Respiratory:  Negative for wheezing.   Cardiovascular:  Negative for chest pain, palpitations, orthopnea and leg swelling.  Gastrointestinal:  Negative for abdominal pain, blood in stool, constipation, diarrhea, heartburn, melena, nausea and vomiting.       Sometimes- dysphagia  Genitourinary:  Negative for dysuria, frequency and urgency.  Musculoskeletal:  Negative for back pain and joint pain.  Skin: Negative.  Negative for itching and rash.  Neurological:  Negative for dizziness, tingling, focal weakness, weakness  and headaches.  Endo/Heme/Allergies:  Does not bruise/bleed easily.  Psychiatric/Behavioral:  Negative for depression. The patient is not nervous/anxious and does not have insomnia.    PAST MEDICAL HISTORY :  Past Medical History:  Diagnosis Date   Alcoholism (HCC)    stopped 1994   Anemia    IRON  INFUSIONS   Arthritis    osteoarthritis/ RA   Asthma    remote h/o asthma   Depression    controlled   Dyspnea    GERD (gastroesophageal reflux disease)    H/O hypoglycemia    H/O: GI bleed    Headache(784.0)    h/o migraines   Hearing loss, bilateral    wears hearing aides   History of blood transfusion    pt states she had yellow jaundice   History of kidney stones    Hypertension    H/O NO TX NOW   Lupus (systemic lupus erythematosus) (HCC) 1995   in remission since 2005   Nasal sinus congestion    Osteoporosis with fracture    Peptic ulcer    Pneumonia    Stress fracture of foot    left foot   Vitamin B 12 deficiency     PAST SURGICAL HISTORY :   Past Surgical History:  Procedure Laterality Date   ANTERIOR CERVICAL DECOMP/DISCECTOMY FUSION  2009   BACK SURGERY     CATARACT EXTRACTION W/PHACO Left 09/30/2016   Procedure: CATARACT EXTRACTION PHACO AND INTRAOCULAR LENS PLACEMENT (IOC);  Surgeon: Clair Crews, MD;  Location: [facility] ORS;  Service: Ophthalmology;  Laterality: Left;  Lot# [id number] H US : 00:43.6 AP%: 19.6 CDE: 8.55  CATARACT EXTRACTION W/PHACO Right 01/11/2019   Procedure: CATARACT EXTRACTION PHACO AND INTRAOCULAR LENS PLACEMENT (IOC) RIGHT;  Surgeon: Clair Crews, MD;  Location: [facility] ORS;  Service: Ophthalmology;  Laterality: Right;  US  01:03.9 CDE 9.19 Fluid pack Lot # [id number]   COLONOSCOPY N/A 04/03/2022   Procedure: COLONOSCOPY;  Surgeon: Quintin Buckle, DO;  Location: [facility] ENDOSCOPY;  Service: Gastroenterology;  Laterality: N/A;   CYSTOSCOPY W/ RETROGRADES  09/02/2022   Procedure: CYSTOSCOPY WITH RETROGRADE PYELOGRAM;  Surgeon:  Geraline Knapp, MD;  Location: [facility] ORS;  Service: Urology;;   CYSTOSCOPY/URETEROSCOPY/HOLMIUM LASER/STENT PLACEMENT Left 09/02/2022   Procedure: CYSTOSCOPY/URETEROSCOPY/HOLMIUM LASER/STENT PLACEMENT;  Surgeon: Geraline Knapp, MD;  Location: [facility] ORS;  Service: Urology;  Laterality: Left;   ESOPHAGOGASTRODUODENOSCOPY N/A 04/03/2022   Procedure: ESOPHAGOGASTRODUODENOSCOPY (EGD);  Surgeon: Quintin Buckle, DO;  Location: [facility] ENDOSCOPY;  Service: Gastroenterology;  Laterality: N/A;   INTRAMEDULLARY (IM) NAIL INTERTROCHANTERIC Right 07/01/2016   Procedure: INTRAMEDULLARY (IM) NAIL EXCHANGE OF FEMORAL ROD;  Surgeon: Molli Angelucci, MD;  Location: [facility] ORS;  Service: Orthopedics;  Laterality: Right;   ORIF FEMUR FRACTURE  2012   PARTIAL GASTRECTOMY  1963   and reconstruction 1973   RECTAL POLYPECTOMY  2023   TONSILLECTOMY  1945    FAMILY HISTORY :   Family History  Problem Relation Age of Onset   Breast cancer Neg Hx     SOCIAL HISTORY:   Social History   Tobacco Use   Smoking status: Never    Passive exposure: Never   Smokeless tobacco: Never  Vaping Use   Vaping status: Never Used  Substance Use Topics   Alcohol use: No   Drug use: No    ALLERGIES:  is allergic to sulfa antibiotics.  MEDICATIONS:  Current Outpatient Medications  Medication Sig Dispense Refill   albuterol  (VENTOLIN  HFA) 108 (90 Base) MCG/ACT inhaler Inhale into the lungs every 6 (six) hours as needed for wheezing or shortness of breath.     azithromycin (ZITHROMAX) 250 MG tablet Take 250 mg by mouth daily. Take every Monday, Wednesday and Friday for 91 days.     BELSOMRA 10 MG TABS Take 10 mg by mouth at bedtime.   5   Calcium  Carb-Cholecalciferol  (CALTRATE 600+D3 PO) Take 1 tablet by mouth 2 (two) times daily.     cholecalciferol  (VITAMIN D ) 1000 UNITS tablet Take 1,000 Units by mouth every evening.      citalopram  (CELEXA ) 20 MG tablet Take 20 mg by mouth daily at 8 pm. (1900) (Patient not taking:  Reported on 12/25/2023)     cyanocobalamin (,VITAMIN B-12,) 1000 MCG/ML injection Inject 1,000 mcg into the muscle every 30 (thirty) days.     denosumab  (PROLIA ) 60 MG/ML SOSY injection Inject 60 mg into the skin every 6 (six) months.     esomeprazole (NEXIUM) 40 MG capsule Take 40 mg by mouth 2 (two) times daily before a meal.     gabapentin  (NEURONTIN ) 100 MG capsule Take 100-200 mg by mouth See admin instructions. Take 1 capsule (100 mg) by mouth in the morning & take 2 capsules (200 mg) by mouth at night.     HYDROcodone -acetaminophen  (NORCO/VICODIN) 5-325 MG tablet Take 1 tablet by mouth in the morning and at bedtime.     losartan (COZAAR) 25 MG tablet Take 25 mg by mouth daily.     megestrol (MEGACE) 40 MG/ML suspension Take 800 mg by mouth daily.     meloxicam  (MOBIC ) 7.5 MG tablet Take 7.5 mg by mouth daily. (Patient  not taking: Reported on 12/25/2023)     mirtazapine  (REMERON ) 45 MG tablet Take 1 tablet by mouth at bedtime.     Multiple Vitamins-Minerals (PRESERVISION AREDS) TABS Take 1 tablet by mouth 2 (two) times daily.      naloxone (NARCAN) nasal spray 4 mg/0.1 mL Place 1 spray into the nose.     Naltrexone HCl, Pain, (LOTREXONE) 1.5 MG CAPS Take 2.5 mg by mouth daily.     pantoprazole  (PROTONIX ) 40 MG tablet Take 40 mg by mouth daily.     Travoprost , BAK Free, (TRAVATAN ) 0.004 % SOLN ophthalmic solution SMARTSIG:1 Drop(s) In Eye(s) Every Evening     trospium  (SANCTURA ) 20 MG tablet TAKE 1 TABLET (20 MG TOTAL) BY MOUTH 2 (TWO) TIMES DAILY AS NEEDED (FREQUENCY, URGENCY, BURNING, BLADDER SPASM). 30 tablet 3   venlafaxine XR (EFFEXOR-XR) 37.5 MG 24 hr capsule Take by mouth.     vitamin C  (ASCORBIC ACID ) 500 MG tablet Take 500 mg by mouth 2 (two) times daily.     No current facility-administered medications for this visit.    PHYSICAL EXAMINATION: ECOG PERFORMANCE STATUS: 0 - Asymptomatic  BP 120/77 (BP Location: Left Arm, Patient Position: Sitting, Cuff Size: Normal)   Pulse 97    Temp 97.9 F (36.6 C) (Oral)   Resp 16   Ht 4\' 11"  (1.499 m)   Wt 91 lb 4.8 oz (41.4 kg)   SpO2 98%   BMI 18.44 kg/m   Filed Weights   03/29/24 1246  Weight: 91 lb 4.8 oz (41.4 kg)     Physical Exam Constitutional:      Comments: Thin built Caucasian female patient.  HENT:     Head: Normocephalic and atraumatic.     Mouth/Throat:     Pharynx: No oropharyngeal exudate.  Eyes:     Pupils: Pupils are equal, round, and reactive to light.  Cardiovascular:     Rate and Rhythm: Normal rate and regular rhythm.  Pulmonary:     Effort: Pulmonary effort is normal. No respiratory distress.     Breath sounds: Normal breath sounds. No wheezing.  Abdominal:     General: Bowel sounds are normal. There is no distension.     Palpations: Abdomen is soft. There is no mass.     Tenderness: There is no abdominal tenderness. There is no guarding or rebound.  Musculoskeletal:        General: No tenderness. Normal range of motion.     Cervical back: Normal range of motion and neck supple.  Skin:    General: Skin is warm.  Neurological:     Mental Status: She is alert and oriented to person, place, and time.  Psychiatric:        Mood and Affect: Affect normal.      LABORATORY DATA:  I have reviewed the data as listed    Component Value Date/Time   NA 140 03/25/2024 1253   NA 142 02/09/2013 0515   K 4.0 03/25/2024 1253   K 4.0 02/09/2013 0515   CL 111 03/25/2024 1253   CL 113 (H) 02/09/2013 0515   CO2 21 (L) 03/25/2024 1253   CO2 25 02/09/2013 0515   GLUCOSE 86 03/25/2024 1253   GLUCOSE 80 02/09/2013 0515   BUN 27 (H) 03/25/2024 1253   BUN 20 (H) 02/09/2013 0515   CREATININE 1.20 (H) 03/25/2024 1253   CREATININE 1.77 (H) 09/18/2023 1233   CREATININE 0.58 (L) 02/09/2013 0515   CALCIUM  8.2 (L) 03/25/2024 1253  CALCIUM  7.5 (L) 02/09/2013 0515   PROT 7.2 09/11/2021 1043   PROT 6.8 02/08/2013 1558   ALBUMIN 3.9 09/11/2021 1043   ALBUMIN 3.8 02/08/2013 1558   AST 18 09/11/2021  1043   AST 16 02/08/2013 1558   ALT 11 09/11/2021 1043   ALT 12 02/08/2013 1558   ALKPHOS 52 09/11/2021 1043   ALKPHOS 53 02/08/2013 1558   BILITOT 0.1 (L) 09/11/2021 1043   BILITOT 0.2 02/08/2013 1558   GFRNONAA 45 (L) 03/25/2024 1253   GFRNONAA 28 (L) 09/18/2023 1233   GFRNONAA >60 02/09/2013 0515   GFRAA >60 10/06/2019 0902   GFRAA >60 02/09/2013 0515    No results found for: "SPEP", "UPEP"  Lab Results  Component Value Date   WBC 9.5 03/25/2024   NEUTROABS 6.7 03/25/2024   HGB 13.2 03/25/2024   HCT 41.4 03/25/2024   MCV 102.2 (H) 03/25/2024   PLT 290 03/25/2024      Chemistry      Component Value Date/Time   NA 140 03/25/2024 1253   NA 142 02/09/2013 0515   K 4.0 03/25/2024 1253   K 4.0 02/09/2013 0515   CL 111 03/25/2024 1253   CL 113 (H) 02/09/2013 0515   CO2 21 (L) 03/25/2024 1253   CO2 25 02/09/2013 0515   BUN 27 (H) 03/25/2024 1253   BUN 20 (H) 02/09/2013 0515   CREATININE 1.20 (H) 03/25/2024 1253   CREATININE 1.77 (H) 09/18/2023 1233   CREATININE 0.58 (L) 02/09/2013 0515      Component Value Date/Time   CALCIUM  8.2 (L) 03/25/2024 1253   CALCIUM  7.5 (L) 02/09/2013 0515   ALKPHOS 52 09/11/2021 1043   ALKPHOS 53 02/08/2013 1558   AST 18 09/11/2021 1043   AST 16 02/08/2013 1558   ALT 11 09/11/2021 1043   ALT 12 02/08/2013 1558   BILITOT 0.1 (L) 09/11/2021 1043   BILITOT 0.2 02/08/2013 1558       RADIOGRAPHIC STUDIES: I have personally reviewed the radiological images as listed and agreed with the findings in the report. No results found.   ASSESSMENT & PLAN:  Iron  deficiency anemia following bariatric surgery # Iron  deficient anemia-s/p partial gastrectomy/CKD stage III- MAY 2025- hemoglobin stable-13.0 saturation 22%;Ferritin- 22- hold off IV iron .Recommend compliance gentle iron  [iron  biglycinate; 28 mg ] 1 pill a day.  This pill is unlikely to cause stomach upset or cause constipation. HOLD off venofer  today.   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  [avoid nephrotoxins]- GFR 28- recommend follow up with PCP/nephrology.   # B12 deficiency secondary to surgery- continue parental B12 with PCP-- stable,   # DISPOSITION: # HOLD venofer  today # follow up in 6 month- MD-possible Venofer -; labs- 1-2 days prior- cbc/BMP; B12/crp/iron  studies/ferritin/ Dr.B  Cc: Dr.Hedrick    No orders of the defined types were placed in this encounter.  All questions were answered. The patient knows to call the clinic with any problems, questions or concerns.      Gwyn Leos, MD 03/29/2024 1:08 PM

## 2024-03-29 NOTE — Assessment & Plan Note (Addendum)
#   Iron  deficient anemia-s/p partial gastrectomy/CKD stage III- MAY 2025- hemoglobin stable-13.0 saturation 22%;Ferritin- 22- hold off IV iron .Recommend compliance gentle iron  [iron  biglycinate; 28 mg ] 1 pill a day.  This pill is unlikely to cause stomach upset or cause constipation. HOLD off venofer  today.   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  [avoid nephrotoxins]- GFR 28- recommend follow up with PCP/nephrology.   # B12 deficiency secondary to surgery- continue parental B12 with PCP-- stable,   # DISPOSITION: # HOLD venofer  today # follow up in 6 month- MD-possible Venofer -; labs- 1-2 days prior- cbc/BMP; B12/crp/iron  studies/ferritin/ Dr.B  Cc: Dr.Hedrick

## 2024-03-29 NOTE — Progress Notes (Signed)
 Patient is finally gaining some of her weight back, her appetite is better. She is getting a shot in her back next week to help with her back pain.

## 2024-06-01 ENCOUNTER — Encounter: Payer: Self-pay | Admitting: Urology

## 2024-06-08 ENCOUNTER — Emergency Department
Admission: EM | Admit: 2024-06-08 | Discharge: 2024-06-09 | Disposition: A | Discharge status: Home / Self Care | Attending: Emergency Medicine | Admitting: Emergency Medicine

## 2024-06-08 ENCOUNTER — Emergency Department

## 2024-06-08 ENCOUNTER — Other Ambulatory Visit: Payer: Self-pay

## 2024-06-08 ENCOUNTER — Encounter: Payer: Self-pay | Admitting: *Deleted

## 2024-06-08 DIAGNOSIS — S51811A Laceration without foreign body of right forearm, initial encounter: Secondary | ICD-10-CM | POA: Diagnosis not present

## 2024-06-08 DIAGNOSIS — S81811A Laceration without foreign body, right lower leg, initial encounter: Secondary | ICD-10-CM | POA: Diagnosis not present

## 2024-06-08 DIAGNOSIS — S42112A Displaced fracture of body of scapula, left shoulder, initial encounter for closed fracture: Secondary | ICD-10-CM | POA: Insufficient documentation

## 2024-06-08 DIAGNOSIS — Y92410 Unspecified street and highway as the place of occurrence of the external cause: Secondary | ICD-10-CM | POA: Diagnosis not present

## 2024-06-08 DIAGNOSIS — S4992XA Unspecified injury of left shoulder and upper arm, initial encounter: Secondary | ICD-10-CM | POA: Diagnosis present
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
## 2024-06-09 MED ORDER — OXYCODONE HCL 5 MG PO TABS
5.0000 mg | ORAL_TABLET | Freq: Once | ORAL | Status: AC
  Administered 2024-06-09: 5 mg via ORAL
  Filled 2024-06-09: qty 1

## 2024-06-09 MED ORDER — OXYCODONE HCL 5 MG PO TABS: 2.5000 mg | ORAL_TABLET | Freq: Once | ORAL | Status: DC

## 2024-06-09 MED ORDER — LIDOCAINE HCL (PF) 1 % IJ SOLN
5.0000 mL | Freq: Once | INTRAMUSCULAR | Status: AC
  Administered 2024-06-09: 5 mL
  Filled 2024-06-09: qty 5

## 2024-06-09 MED ORDER — BACITRACIN ZINC 500 UNIT/GM EX OINT
TOPICAL_OINTMENT | Freq: Once | CUTANEOUS | Status: AC
  Administered 2024-06-09: 1 via TOPICAL
  Filled 2024-06-09: qty 0.9

## 2024-06-09 MED ORDER — OXYCODONE HCL 5 MG PO TABS: 5.0000 mg | ORAL_TABLET | Freq: Three times a day (TID) | ORAL | 0 refills | Status: AC | PRN

## 2024-06-09 MED ORDER — ACETAMINOPHEN 500 MG PO TABS
1000.0000 mg | ORAL_TABLET | Freq: Once | ORAL | Status: AC
  Administered 2024-06-09: 1000 mg via ORAL
  Filled 2024-06-09: qty 2

## 2024-06-09 NOTE — ED Notes (Signed)
 Wound dressed with non adherent dressing. Pt tolerated well. Pt educated on wound care and educated on DC instructions. Pt verbalized understanding. NADN. Pt DC to lobby to wait for ride.

## 2024-06-09 NOTE — ED Notes (Signed)
 This nurse was assisting pt to sit on side of bed when it blood was noted on lower part of pt's right pant leg. 4 cm x 2 cm avulsion noted upon inspection. Small amount of bleeding noted. Ester Sharps, MD notified.
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
## 2024-06-09 NOTE — ED Notes (Signed)
 Skin tears noted to pt's RFA and right wrist. 4 cm x 1.5 m; 2.5 cm x 2.5 cm; 1 x 0.5

## 2024-06-09 NOTE — Discharge Instructions (Addendum)
 Use Tylenol  for pain and fevers.  Up to 1000 mg per dose, up to 4 times per day.  Do not take more than 4000 mg of Tylenol /acetaminophen  within 24 hours..  Oxycodone  as needed for more severe, breakthrough pains  2 stitches to your right leg will absorb on their own.  Gently wash the wound with soap and water.  It is okay to shower, but do not submerge in a bath or go swimming as it is healing.  Do not vigorously scrub.   Gently pat dry.   Once dry, then apply Neosporin  or bacitracin  or even Vaseline ointment to the area to act as a barrier to help prevent infection.

## 2024-09-28 ENCOUNTER — Inpatient Hospital Stay: Attending: Internal Medicine

## 2024-09-28 DIAGNOSIS — D508 Other iron deficiency anemias: Secondary | ICD-10-CM | POA: Diagnosis present

## 2024-09-28 DIAGNOSIS — Z87442 Personal history of urinary calculi: Secondary | ICD-10-CM | POA: Diagnosis not present

## 2024-09-28 DIAGNOSIS — Z9884 Bariatric surgery status: Secondary | ICD-10-CM | POA: Diagnosis not present

## 2024-09-28 DIAGNOSIS — N184 Chronic kidney disease, stage 4 (severe): Secondary | ICD-10-CM | POA: Insufficient documentation

## 2024-09-28 DIAGNOSIS — E538 Deficiency of other specified B group vitamins: Secondary | ICD-10-CM | POA: Insufficient documentation

## 2024-09-28 LAB — CBC WITH DIFFERENTIAL (CANCER CENTER ONLY)
Abs Immature Granulocytes: 0.05 K/uL (ref 0.00–0.07)
Basophils Absolute: 0.1 K/uL (ref 0.0–0.1)
Basophils Relative: 1 %
Eosinophils Absolute: 0 K/uL (ref 0.0–0.5)
Eosinophils Relative: 0 %
HCT: 45 % (ref 36.0–46.0)
Hemoglobin: 14.6 g/dL (ref 12.0–15.0)
Immature Granulocytes: 1 %
Lymphocytes Relative: 15 %
Lymphs Abs: 1.3 K/uL (ref 0.7–4.0)
MCH: 31.6 pg (ref 26.0–34.0)
MCHC: 32.4 g/dL (ref 30.0–36.0)
MCV: 97.4 fL (ref 80.0–100.0)
Monocytes Absolute: 0.3 K/uL (ref 0.1–1.0)
Monocytes Relative: 3 %
Neutro Abs: 7 K/uL (ref 1.7–7.7)
Neutrophils Relative %: 80 %
Platelet Count: 358 K/uL (ref 150–400)
RBC: 4.62 MIL/uL (ref 3.87–5.11)
RDW: 13.4 % (ref 11.5–15.5)
WBC Count: 8.6 K/uL (ref 4.0–10.5)
nRBC: 0 % (ref 0.0–0.2)

## 2024-09-28 LAB — BASIC METABOLIC PANEL - CANCER CENTER ONLY
Anion gap: 14 (ref 5–15)
BUN: 19 mg/dL (ref 8–23)
CO2: 18 mmol/L — ABNORMAL LOW (ref 22–32)
Calcium: 9.5 mg/dL (ref 8.9–10.3)
Chloride: 109 mmol/L (ref 98–111)
Creatinine: 1.69 mg/dL — ABNORMAL HIGH (ref 0.44–1.00)
GFR, Estimated: 29 mL/min — ABNORMAL LOW (ref >60)
Glucose, Bld: 266 mg/dL — ABNORMAL HIGH (ref 70–99)
Potassium: 4.6 mmol/L (ref 3.5–5.1)
Sodium: 142 mmol/L (ref 135–145)

## 2024-09-28 LAB — FERRITIN: Ferritin: 45 ng/mL (ref 11–307)

## 2024-09-28 LAB — IRON AND TIBC
Iron: 75 ug/dL (ref 28–170)
Saturation Ratios: 22 % (ref 10.4–31.8)
TIBC: 336 ug/dL (ref 250–450)
UIBC: 261 ug/dL

## 2024-09-28 LAB — VITAMIN B12: Vitamin B-12: 324 pg/mL (ref 180–914)

## 2024-09-28 LAB — C-REACTIVE PROTEIN: CRP: 0.5 mg/dL (ref <1.0)

## 2024-09-30 ENCOUNTER — Encounter: Payer: Self-pay | Admitting: Internal Medicine

## 2024-09-30 ENCOUNTER — Inpatient Hospital Stay: Admitting: Internal Medicine

## 2024-09-30 ENCOUNTER — Ambulatory Visit

## 2024-09-30 VITALS — BP 162/84 | HR 90 | Temp 98.1°F | Resp 16 | Ht 60.0 in | Wt 84.4 lb

## 2024-09-30 DIAGNOSIS — D508 Other iron deficiency anemias: Secondary | ICD-10-CM | POA: Diagnosis not present

## 2024-09-30 DIAGNOSIS — K9589 Other complications of other bariatric procedure: Secondary | ICD-10-CM | POA: Diagnosis not present
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
## 2024-09-30 NOTE — Assessment & Plan Note (Signed)
#   Iron  deficient anemia-s/p partial gastrectomy/CKD stage III- MAY 2025- hemoglobin stable-13.0 saturation 22%;Ferritin- 22- hold off IV iron .Recommend compliance gentle iron  [iron  biglycinate; 28 mg ] 1 pill a day.  This pill is unlikely to cause stomach upset or cause constipation. HOLD off venofer  today.   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  [avoid nephrotoxins]- GFR 28- recommend follow up with PCP/nephrology.   # B12 deficiency secondary to surgery- continue parental B12 with PCP-- stable,   # fatigue: ? Etiology- lung diease vs other- like- gabapentin - defer to PCP.   # DISPOSITION: # HOLD venofer  today # follow up in 6 month- MD-possible Venofer -; labs- 1-2 days prior- cbc/BMP; B12/crp/iron  studies/ferritin/ Dr.B  Cc: Dr.Hedrick
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
## 2024-12-06 ENCOUNTER — Ambulatory Visit: Payer: Medicare PPO | Admitting: Dermatology

## 2024-12-20 ENCOUNTER — Ambulatory Visit: Admitting: Urology

## 2024-12-22 ENCOUNTER — Ambulatory Visit: Payer: Medicare PPO | Admitting: Urology

## 2025-01-03 ENCOUNTER — Encounter: Admitting: Dermatology

## 2025-03-29 ENCOUNTER — Inpatient Hospital Stay

## 2025-03-31 ENCOUNTER — Inpatient Hospital Stay: Admitting: Internal Medicine

## 2025-03-31 ENCOUNTER — Inpatient Hospital Stay
